# Patient Record
Sex: Female | Born: 1950 | Race: White | Hispanic: No | Marital: Single | Smoking: Former smoker
Health system: Southern US, Community
[De-identification: ages and names within clinical notes are randomized; demographics above are authoritative.]

## PROBLEM LIST (undated history)

## (undated) DIAGNOSIS — I10 Essential (primary) hypertension: Secondary | ICD-10-CM

## (undated) DIAGNOSIS — E785 Hyperlipidemia, unspecified: Secondary | ICD-10-CM

## (undated) DIAGNOSIS — IMO0001 Reserved for inherently not codable concepts without codable children: Secondary | ICD-10-CM

## (undated) DIAGNOSIS — I2510 Atherosclerotic heart disease of native coronary artery without angina pectoris: Secondary | ICD-10-CM

## (undated) DIAGNOSIS — I7300 Raynaud's syndrome without gangrene: Secondary | ICD-10-CM

## (undated) DIAGNOSIS — R230 Cyanosis: Secondary | ICD-10-CM

## (undated) DIAGNOSIS — C519 Malignant neoplasm of vulva, unspecified: Secondary | ICD-10-CM

## (undated) DIAGNOSIS — N289 Disorder of kidney and ureter, unspecified: Secondary | ICD-10-CM

## (undated) DIAGNOSIS — I209 Angina pectoris, unspecified: Secondary | ICD-10-CM

## (undated) DIAGNOSIS — K219 Gastro-esophageal reflux disease without esophagitis: Secondary | ICD-10-CM

## (undated) DIAGNOSIS — R609 Edema, unspecified: Secondary | ICD-10-CM

## (undated) DIAGNOSIS — C569 Malignant neoplasm of unspecified ovary: Secondary | ICD-10-CM

## (undated) DIAGNOSIS — C52 Malignant neoplasm of vagina: Secondary | ICD-10-CM

## (undated) DIAGNOSIS — I509 Heart failure, unspecified: Secondary | ICD-10-CM

## (undated) DIAGNOSIS — G8929 Other chronic pain: Secondary | ICD-10-CM

## (undated) DIAGNOSIS — M549 Dorsalgia, unspecified: Secondary | ICD-10-CM

## (undated) DIAGNOSIS — J449 Chronic obstructive pulmonary disease, unspecified: Secondary | ICD-10-CM

## (undated) DIAGNOSIS — M349 Systemic sclerosis, unspecified: Secondary | ICD-10-CM

## (undated) HISTORY — DX: Malignant neoplasm of vulva, unspecified: C51.9

## (undated) HISTORY — DX: Malignant neoplasm of vagina: C52

## (undated) HISTORY — PX: CERVICAL FUSION: SHX112

## (undated) HISTORY — DX: Essential (primary) hypertension: I10

## (undated) HISTORY — DX: Cyanosis: R23.0

## (undated) HISTORY — DX: Systemic sclerosis, unspecified: M34.9

## (undated) HISTORY — DX: Other chronic pain: G89.29

## (undated) HISTORY — DX: Dorsalgia, unspecified: M54.9

## (undated) HISTORY — DX: Chronic obstructive pulmonary disease, unspecified: J44.9

## (undated) HISTORY — DX: Heart failure, unspecified: I50.9

## (undated) HISTORY — PX: VULVA SURGERY: SHX837

## (undated) HISTORY — DX: Atherosclerotic heart disease of native coronary artery without angina pectoris: I25.10

## (undated) HISTORY — PX: BREAST BIOPSY: SHX20

## (undated) HISTORY — DX: Malignant neoplasm of unspecified ovary: C56.9

## (undated) HISTORY — DX: Raynaud's syndrome without gangrene: I73.00

## (undated) HISTORY — DX: Disorder of kidney and ureter, unspecified: N28.9

## (undated) HISTORY — PX: NASAL SINUS SURGERY: SHX719

## (undated) HISTORY — DX: Gastro-esophageal reflux disease without esophagitis: K21.9

## (undated) HISTORY — PX: ABDOMINAL HYSTERECTOMY: SHX81

---

## 1988-09-09 HISTORY — PX: BACK SURGERY: SHX140

## 2007-04-08 ENCOUNTER — Ambulatory Visit: Payer: Self-pay | Admitting: Nurse Practitioner

## 2007-07-07 ENCOUNTER — Ambulatory Visit: Payer: Self-pay | Admitting: Family Medicine

## 2007-07-24 ENCOUNTER — Ambulatory Visit: Payer: Self-pay | Admitting: Family Medicine

## 2007-11-04 ENCOUNTER — Ambulatory Visit: Payer: Self-pay | Admitting: Dermatology

## 2007-11-16 ENCOUNTER — Ambulatory Visit: Payer: Self-pay | Admitting: Dermatology

## 2008-07-07 ENCOUNTER — Ambulatory Visit: Payer: Self-pay | Admitting: Family Medicine

## 2008-09-07 ENCOUNTER — Ambulatory Visit: Payer: Self-pay | Admitting: Family Medicine

## 2008-09-13 ENCOUNTER — Ambulatory Visit: Payer: Self-pay | Admitting: Urology

## 2008-12-13 ENCOUNTER — Ambulatory Visit: Payer: Self-pay | Admitting: Rheumatology

## 2009-01-12 ENCOUNTER — Ambulatory Visit: Payer: Self-pay | Admitting: Rheumatology

## 2009-11-03 ENCOUNTER — Ambulatory Visit: Payer: Self-pay | Admitting: Family Medicine

## 2010-04-21 ENCOUNTER — Ambulatory Visit: Payer: Self-pay | Admitting: Internal Medicine

## 2010-11-06 ENCOUNTER — Ambulatory Visit: Payer: Self-pay | Admitting: Family Medicine

## 2011-01-21 ENCOUNTER — Ambulatory Visit: Payer: Self-pay | Admitting: Family Medicine

## 2011-01-21 ENCOUNTER — Inpatient Hospital Stay: Payer: Self-pay | Admitting: *Deleted

## 2012-01-07 ENCOUNTER — Ambulatory Visit: Payer: Self-pay | Admitting: Family Medicine

## 2012-01-08 ENCOUNTER — Ambulatory Visit: Payer: Self-pay | Admitting: Family Medicine

## 2012-01-15 HISTORY — PX: BREAST BIOPSY: SHX20

## 2012-01-28 ENCOUNTER — Ambulatory Visit: Payer: Self-pay | Admitting: Surgery

## 2012-01-28 DIAGNOSIS — E119 Type 2 diabetes mellitus without complications: Secondary | ICD-10-CM

## 2012-01-28 LAB — BASIC METABOLIC PANEL
Calcium, Total: 9 mg/dL (ref 8.5–10.1)
Chloride: 102 mmol/L (ref 98–107)
Co2: 29 mmol/L (ref 21–32)
EGFR (African American): >60
EGFR (Non-African Amer.): >60
Glucose: 161 mg/dL — ABNORMAL HIGH (ref 65–99)
Sodium: 139 mmol/L (ref 136–145)

## 2012-01-28 LAB — CBC WITH DIFFERENTIAL/PLATELET
Basophil #: 0 10*3/uL (ref 0.0–0.1)
Basophil %: 0.3 %
Eosinophil #: 0.1 10*3/uL (ref 0.0–0.7)
HCT: 45.5 % (ref 35.0–47.0)
HGB: 15 g/dL (ref 12.0–16.0)
Lymphocyte #: 1.5 10*3/uL (ref 1.0–3.6)
Lymphocyte %: 18.5 %
MCH: 30.9 pg (ref 26.0–34.0)
MCV: 94 fL (ref 80–100)
Monocyte #: 0.5 x10 3/mm (ref 0.2–0.9)
Monocyte %: 6.2 %
Platelet: 218 10*3/uL (ref 150–440)
RDW: 13.8 % (ref 11.5–14.5)
WBC: 8.2 10*3/uL (ref 3.6–11.0)

## 2012-02-04 ENCOUNTER — Ambulatory Visit: Payer: Self-pay | Admitting: Surgery

## 2012-02-06 LAB — PATHOLOGY REPORT

## 2012-03-06 ENCOUNTER — Ambulatory Visit: Payer: Self-pay | Admitting: Urology

## 2012-03-23 ENCOUNTER — Ambulatory Visit: Payer: Self-pay | Admitting: Urology

## 2012-03-31 ENCOUNTER — Ambulatory Visit: Payer: Self-pay | Admitting: Internal Medicine

## 2012-06-16 ENCOUNTER — Ambulatory Visit: Payer: Self-pay | Admitting: Urology

## 2012-06-30 ENCOUNTER — Ambulatory Visit: Payer: Self-pay | Admitting: Urology

## 2012-06-30 LAB — BASIC METABOLIC PANEL
BUN: 14 mg/dL (ref 7–18)
Calcium, Total: 8.8 mg/dL (ref 8.5–10.1)
Co2: 24 mmol/L (ref 21–32)
Osmolality: 280 (ref 275–301)
Potassium: 3.6 mmol/L (ref 3.5–5.1)
Sodium: 138 mmol/L (ref 136–145)

## 2012-06-30 LAB — APTT: Activated PTT: 27.7 secs (ref 23.6–35.9)

## 2012-07-01 LAB — CBC WITH DIFFERENTIAL/PLATELET
Basophil #: 0 10*3/uL (ref 0.0–0.1)
Basophil %: 0.2 %
Eosinophil #: 0.1 10*3/uL (ref 0.0–0.7)
Eosinophil %: 0.6 %
HCT: 36.3 % (ref 35.0–47.0)
HGB: 12.1 g/dL (ref 12.0–16.0)
Lymphocyte #: 1.6 10*3/uL (ref 1.0–3.6)
Lymphocyte %: 16.4 %
MCHC: 33.4 g/dL (ref 32.0–36.0)
MCV: 89 fL (ref 80–100)
Monocyte %: 7 %
Neutrophil #: 7.4 10*3/uL — ABNORMAL HIGH (ref 1.4–6.5)
RDW: 13.5 % (ref 11.5–14.5)
WBC: 9.7 10*3/uL (ref 3.6–11.0)

## 2012-07-01 LAB — BASIC METABOLIC PANEL
Calcium, Total: 8.1 mg/dL — ABNORMAL LOW (ref 8.5–10.1)
Chloride: 107 mmol/L (ref 98–107)
Co2: 22 mmol/L (ref 21–32)
Creatinine: 0.74 mg/dL (ref 0.60–1.30)
EGFR (Non-African Amer.): >60
Sodium: 139 mmol/L (ref 136–145)

## 2013-02-25 ENCOUNTER — Ambulatory Visit: Payer: Self-pay | Admitting: Family Medicine

## 2013-03-31 ENCOUNTER — Ambulatory Visit: Payer: Self-pay | Admitting: Gynecologic Oncology

## 2013-04-06 ENCOUNTER — Ambulatory Visit: Payer: Self-pay | Admitting: Gynecologic Oncology

## 2013-04-09 ENCOUNTER — Ambulatory Visit: Payer: Self-pay | Admitting: Gynecologic Oncology

## 2013-04-19 ENCOUNTER — Ambulatory Visit: Payer: Self-pay | Admitting: Gynecologic Oncology

## 2013-04-19 LAB — CBC
HGB: 14.5 g/dL (ref 12.0–16.0)
MCH: 31.3 pg (ref 26.0–34.0)
MCHC: 35 g/dL (ref 32.0–36.0)
MCV: 90 fL (ref 80–100)
RBC: 4.63 10*6/uL (ref 3.80–5.20)

## 2013-04-19 LAB — BASIC METABOLIC PANEL
Anion Gap: 8 (ref 7–16)
Calcium, Total: 9.4 mg/dL (ref 8.5–10.1)
Chloride: 98 mmol/L (ref 98–107)
Co2: 30 mmol/L (ref 21–32)
Creatinine: 0.82 mg/dL (ref 0.60–1.30)
EGFR (Non-African Amer.): >60
Sodium: 136 mmol/L (ref 136–145)

## 2013-05-05 ENCOUNTER — Observation Stay: Payer: Self-pay | Admitting: Gynecologic Oncology

## 2013-05-05 LAB — CBC WITH DIFFERENTIAL/PLATELET
Eosinophil #: 0.1 10*3/uL (ref 0.0–0.7)
Eosinophil %: 0.8 %
HCT: 33.3 % — ABNORMAL LOW (ref 35.0–47.0)
Lymphocyte #: 1.6 10*3/uL (ref 1.0–3.6)
Lymphocyte %: 20.2 %
MCH: 31.6 pg (ref 26.0–34.0)
MCHC: 34.9 g/dL (ref 32.0–36.0)
MCV: 91 fL (ref 80–100)
Neutrophil #: 5.6 10*3/uL (ref 1.4–6.5)
RBC: 3.67 10*6/uL — ABNORMAL LOW (ref 3.80–5.20)
RDW: 14.9 % — ABNORMAL HIGH (ref 11.5–14.5)

## 2013-05-05 LAB — BASIC METABOLIC PANEL
BUN: 16 mg/dL (ref 7–18)
Calcium, Total: 8.6 mg/dL (ref 8.5–10.1)
Chloride: 101 mmol/L (ref 98–107)
Creatinine: 0.84 mg/dL (ref 0.60–1.30)
EGFR (African American): >60
EGFR (Non-African Amer.): >60
Glucose: 88 mg/dL (ref 65–99)
Osmolality: 274 (ref 275–301)
Sodium: 137 mmol/L (ref 136–145)

## 2013-05-10 ENCOUNTER — Ambulatory Visit: Payer: Self-pay | Admitting: Gynecologic Oncology

## 2013-05-11 ENCOUNTER — Ambulatory Visit: Payer: Self-pay | Admitting: Gynecologic Oncology

## 2013-05-11 LAB — PATHOLOGY REPORT

## 2013-05-31 ENCOUNTER — Ambulatory Visit: Payer: Self-pay | Admitting: Gynecologic Oncology

## 2013-06-09 ENCOUNTER — Ambulatory Visit: Payer: Self-pay | Admitting: Gynecologic Oncology

## 2013-06-15 ENCOUNTER — Ambulatory Visit: Payer: Self-pay | Admitting: Gynecologic Oncology

## 2013-07-05 ENCOUNTER — Ambulatory Visit: Payer: Self-pay | Admitting: Gynecologic Oncology

## 2013-07-05 LAB — BASIC METABOLIC PANEL
Anion Gap: 6 — ABNORMAL LOW (ref 7–16)
Calcium, Total: 9.5 mg/dL (ref 8.5–10.1)
Co2: 29 mmol/L (ref 21–32)
Glucose: 125 mg/dL — ABNORMAL HIGH (ref 65–99)
Potassium: 3.6 mmol/L (ref 3.5–5.1)

## 2013-07-05 LAB — CBC
HCT: 37.2 % (ref 35.0–47.0)
MCV: 84 fL (ref 80–100)
RBC: 4.41 10*6/uL (ref 3.80–5.20)
RDW: 15.3 % — ABNORMAL HIGH (ref 11.5–14.5)
WBC: 11.9 10*3/uL — ABNORMAL HIGH (ref 3.6–11.0)

## 2013-07-10 ENCOUNTER — Ambulatory Visit: Payer: Self-pay | Admitting: Gynecologic Oncology

## 2013-07-13 ENCOUNTER — Inpatient Hospital Stay: Payer: Self-pay | Admitting: Obstetrics and Gynecology

## 2013-08-09 ENCOUNTER — Ambulatory Visit: Payer: Self-pay | Admitting: Gynecologic Oncology

## 2013-08-13 LAB — PATHOLOGY REPORT

## 2013-09-09 ENCOUNTER — Ambulatory Visit: Payer: Self-pay | Admitting: Gynecologic Oncology

## 2013-11-25 ENCOUNTER — Inpatient Hospital Stay: Payer: Self-pay | Admitting: Internal Medicine

## 2013-11-25 LAB — CBC
HCT: 33.4 % — AB (ref 35.0–47.0)
HGB: 11.2 g/dL — ABNORMAL LOW (ref 12.0–16.0)
MCH: 31 pg (ref 26.0–34.0)
MCHC: 33.5 g/dL (ref 32.0–36.0)
MCV: 92 fL (ref 80–100)
Platelet: 216 10*3/uL (ref 150–440)
RBC: 3.62 10*6/uL — AB (ref 3.80–5.20)
RDW: 21.1 % — ABNORMAL HIGH (ref 11.5–14.5)
WBC: 9.7 10*3/uL (ref 3.6–11.0)

## 2013-11-25 LAB — URINALYSIS, COMPLETE
Bilirubin,UR: NEGATIVE
Ketone: NEGATIVE
Leukocyte Esterase: NEGATIVE
Nitrite: NEGATIVE
Ph: 5 (ref 4.5–8.0)
SPECIFIC GRAVITY: 1.029 (ref 1.003–1.030)
SQUAMOUS EPITHELIAL: NONE SEEN
WBC UR: 4 /HPF (ref 0–5)

## 2013-11-25 LAB — DIFFERENTIAL
Basophil #: 0.1 x10 3/mm 3
Basophil %: 1 %
Eosinophil #: 0 x10 3/mm 3
Eosinophil %: 0.1 %
Lymphocyte %: 5.3 %
Lymphs Abs: 0.5 x10 3/mm 3 — ABNORMAL LOW
Monocyte #: 0.4 "x10 3/mm "
Monocyte %: 4.6 %
Neutrophil #: 8.7 x10 3/mm 3 — ABNORMAL HIGH
Neutrophil %: 89 %

## 2013-11-25 LAB — PRO B NATRIURETIC PEPTIDE: B-Type Natriuretic Peptide: 12851 pg/mL — ABNORMAL HIGH (ref 0–125)

## 2013-11-25 LAB — BASIC METABOLIC PANEL
Anion Gap: 8 (ref 7–16)
BUN: 11 mg/dL (ref 7–18)
CREATININE: 1.26 mg/dL (ref 0.60–1.30)
Calcium, Total: 9.1 mg/dL (ref 8.5–10.1)
Chloride: 97 mmol/L — ABNORMAL LOW (ref 98–107)
Co2: 23 mmol/L (ref 21–32)
EGFR (African American): 53 — ABNORMAL LOW
EGFR (Non-African Amer.): 45 — ABNORMAL LOW
GLUCOSE: 407 mg/dL — AB (ref 65–99)
OSMOLALITY: 274 (ref 275–301)
Potassium: 3.2 mmol/L — ABNORMAL LOW (ref 3.5–5.1)
Sodium: 128 mmol/L — ABNORMAL LOW (ref 136–145)

## 2013-11-25 LAB — HEPATIC FUNCTION PANEL A (ARMC)
Albumin: 3.4 g/dL
Alkaline Phosphatase: 140 U/L — ABNORMAL HIGH
Bilirubin, Direct: 0.2 mg/dL
Bilirubin,Total: 0.5 mg/dL
SGOT(AST): 20 U/L
SGPT (ALT): 16 U/L
Total Protein: 7.5 g/dL

## 2013-11-25 LAB — MAGNESIUM: Magnesium: 1.9 mg/dL

## 2013-11-25 LAB — CK-MB
CK-MB: 6 ng/mL — ABNORMAL HIGH
CK-MB: 5.5 ng/mL — ABNORMAL HIGH

## 2013-11-25 LAB — TROPONIN I
Troponin-I: 0.49 ng/mL — ABNORMAL HIGH
Troponin-I: 0.68 ng/mL — ABNORMAL HIGH

## 2013-11-26 LAB — LIPID PANEL
Cholesterol: 273 mg/dL — ABNORMAL HIGH (ref 0–200)
HDL: 27 mg/dL — AB (ref 40–60)
Triglycerides: 1161 mg/dL — ABNORMAL HIGH (ref 0–200)

## 2013-11-26 LAB — CBC WITH DIFFERENTIAL/PLATELET
Basophil #: 0.1 10*3/uL (ref 0.0–0.1)
Basophil %: 1.3 %
EOS ABS: 0.1 10*3/uL (ref 0.0–0.7)
EOS PCT: 0.8 %
HCT: 28.9 % — ABNORMAL LOW (ref 35.0–47.0)
HGB: 10 g/dL — ABNORMAL LOW (ref 12.0–16.0)
Lymphocyte #: 1 10*3/uL (ref 1.0–3.6)
Lymphocyte %: 11.7 %
MCH: 31.6 pg (ref 26.0–34.0)
MCHC: 34.6 g/dL (ref 32.0–36.0)
MCV: 91 fL (ref 80–100)
MONO ABS: 0.6 x10 3/mm (ref 0.2–0.9)
Monocyte %: 7.7 %
Neutrophil #: 6.5 10*3/uL (ref 1.4–6.5)
Neutrophil %: 78.5 %
Platelet: 197 10*3/uL (ref 150–440)
RBC: 3.17 10*6/uL — ABNORMAL LOW (ref 3.80–5.20)
RDW: 21.5 % — ABNORMAL HIGH (ref 11.5–14.5)
WBC: 8.3 10*3/uL (ref 3.6–11.0)

## 2013-11-26 LAB — BASIC METABOLIC PANEL
ANION GAP: 7 (ref 7–16)
BUN: 10 mg/dL (ref 7–18)
CALCIUM: 8.6 mg/dL (ref 8.5–10.1)
Chloride: 99 mmol/L (ref 98–107)
Co2: 27 mmol/L (ref 21–32)
Creatinine: 1.24 mg/dL (ref 0.60–1.30)
EGFR (African American): 54 — ABNORMAL LOW
GFR CALC NON AF AMER: 46 — AB
Glucose: 118 mg/dL — ABNORMAL HIGH (ref 65–99)
OSMOLALITY: 267 (ref 275–301)
Potassium: 3 mmol/L — ABNORMAL LOW (ref 3.5–5.1)
Sodium: 133 mmol/L — ABNORMAL LOW (ref 136–145)

## 2013-11-26 LAB — HEMOGLOBIN A1C: Hemoglobin A1C: 8 % — ABNORMAL HIGH (ref 4.2–6.3)

## 2013-11-26 LAB — MAGNESIUM
MAGNESIUM: 1.7 mg/dL — AB
MAGNESIUM: 2.4 mg/dL

## 2013-11-26 LAB — POTASSIUM: Potassium: 4 mmol/L (ref 3.5–5.1)

## 2013-11-26 LAB — CK-MB: CK-MB: 5.3 ng/mL — AB (ref 0.5–3.6)

## 2013-11-26 LAB — TSH: THYROID STIMULATING HORM: 3.32 u[IU]/mL

## 2013-11-26 LAB — TROPONIN I: Troponin-I: 1 ng/mL — ABNORMAL HIGH

## 2013-11-27 LAB — BASIC METABOLIC PANEL
Anion Gap: 1 — ABNORMAL LOW (ref 7–16)
BUN: 15 mg/dL (ref 7–18)
CALCIUM: 8.5 mg/dL (ref 8.5–10.1)
CHLORIDE: 97 mmol/L — AB (ref 98–107)
Co2: 33 mmol/L — ABNORMAL HIGH (ref 21–32)
Creatinine: 1.29 mg/dL (ref 0.60–1.30)
EGFR (African American): 51 — ABNORMAL LOW
GFR CALC NON AF AMER: 44 — AB
GLUCOSE: 129 mg/dL — AB (ref 65–99)
Osmolality: 265 (ref 275–301)
Potassium: 3.8 mmol/L (ref 3.5–5.1)
SODIUM: 131 mmol/L — AB (ref 136–145)

## 2013-11-29 LAB — BASIC METABOLIC PANEL
ANION GAP: 5 — AB (ref 7–16)
BUN: 22 mg/dL — AB (ref 7–18)
CO2: 28 mmol/L (ref 21–32)
CREATININE: 1.44 mg/dL — AB (ref 0.60–1.30)
Calcium, Total: 9.4 mg/dL (ref 8.5–10.1)
Chloride: 92 mmol/L — ABNORMAL LOW (ref 98–107)
EGFR (African American): 45 — ABNORMAL LOW
EGFR (Non-African Amer.): 39 — ABNORMAL LOW
Glucose: 206 mg/dL — ABNORMAL HIGH (ref 65–99)
Osmolality: 261 (ref 275–301)
Potassium: 4.3 mmol/L (ref 3.5–5.1)
Sodium: 125 mmol/L — ABNORMAL LOW (ref 136–145)

## 2013-11-29 LAB — MAGNESIUM: MAGNESIUM: 1.9 mg/dL

## 2013-11-30 LAB — MAGNESIUM: MAGNESIUM: 1.7 mg/dL — AB

## 2013-11-30 LAB — BASIC METABOLIC PANEL
ANION GAP: 7 (ref 7–16)
BUN: 22 mg/dL — ABNORMAL HIGH (ref 7–18)
CALCIUM: 9.4 mg/dL (ref 8.5–10.1)
CHLORIDE: 92 mmol/L — AB (ref 98–107)
Co2: 29 mmol/L (ref 21–32)
Creatinine: 1.38 mg/dL — ABNORMAL HIGH (ref 0.60–1.30)
EGFR (Non-African Amer.): 41 — ABNORMAL LOW
GFR CALC AF AMER: 47 — AB
Glucose: 177 mg/dL — ABNORMAL HIGH (ref 65–99)
Osmolality: 265 (ref 275–301)
POTASSIUM: 3.8 mmol/L (ref 3.5–5.1)
Sodium: 128 mmol/L — ABNORMAL LOW (ref 136–145)

## 2013-12-01 LAB — BASIC METABOLIC PANEL
ANION GAP: 5 — AB (ref 7–16)
BUN: 28 mg/dL — AB (ref 7–18)
CHLORIDE: 92 mmol/L — AB (ref 98–107)
CREATININE: 1.55 mg/dL — AB (ref 0.60–1.30)
Calcium, Total: 8.9 mg/dL (ref 8.5–10.1)
Co2: 32 mmol/L (ref 21–32)
GFR CALC AF AMER: 41 — AB
GFR CALC NON AF AMER: 35 — AB
GLUCOSE: 171 mg/dL — AB (ref 65–99)
Osmolality: 268 (ref 275–301)
Potassium: 3.6 mmol/L (ref 3.5–5.1)
Sodium: 129 mmol/L — ABNORMAL LOW (ref 136–145)

## 2013-12-23 ENCOUNTER — Ambulatory Visit: Payer: Self-pay | Admitting: Cardiovascular Disease

## 2013-12-23 HISTORY — PX: CORONARY ANGIOPLASTY WITH STENT PLACEMENT: SHX49

## 2013-12-23 HISTORY — PX: CARDIAC CATHETERIZATION: SHX172

## 2013-12-23 LAB — BASIC METABOLIC PANEL
Anion Gap: 7 (ref 7–16)
BUN: 10 mg/dL (ref 7–18)
CHLORIDE: 100 mmol/L (ref 98–107)
CO2: 26 mmol/L (ref 21–32)
CREATININE: 1.34 mg/dL — AB (ref 0.60–1.30)
Calcium, Total: 9.5 mg/dL (ref 8.5–10.1)
EGFR (African American): 49 — ABNORMAL LOW
GFR CALC NON AF AMER: 42 — AB
GLUCOSE: 184 mg/dL — AB (ref 65–99)
OSMOLALITY: 270 (ref 275–301)
POTASSIUM: 3.4 mmol/L — AB (ref 3.5–5.1)
Sodium: 133 mmol/L — ABNORMAL LOW (ref 136–145)

## 2013-12-23 LAB — CK TOTAL AND CKMB (NOT AT ARMC)
CK, Total: 176 U/L
CK-MB: 2.3 ng/mL (ref 0.5–3.6)

## 2013-12-24 LAB — BASIC METABOLIC PANEL
Anion Gap: 8 (ref 7–16)
BUN: 10 mg/dL (ref 7–18)
CO2: 27 mmol/L (ref 21–32)
CREATININE: 1.14 mg/dL (ref 0.60–1.30)
Calcium, Total: 8.5 mg/dL (ref 8.5–10.1)
Chloride: 103 mmol/L (ref 98–107)
EGFR (African American): 59 — ABNORMAL LOW
EGFR (Non-African Amer.): 51 — ABNORMAL LOW
Glucose: 178 mg/dL — ABNORMAL HIGH (ref 65–99)
Osmolality: 279 (ref 275–301)
Potassium: 3.3 mmol/L — ABNORMAL LOW (ref 3.5–5.1)
SODIUM: 138 mmol/L (ref 136–145)

## 2014-01-28 ENCOUNTER — Ambulatory Visit: Payer: Self-pay | Admitting: Gynecologic Oncology

## 2014-02-01 LAB — MISC AER/ANAEROBIC CULT.

## 2014-02-08 ENCOUNTER — Ambulatory Visit: Payer: Self-pay | Admitting: Gynecologic Oncology

## 2014-03-09 ENCOUNTER — Ambulatory Visit: Payer: Self-pay | Admitting: Gynecologic Oncology

## 2014-03-15 ENCOUNTER — Ambulatory Visit: Payer: Self-pay | Admitting: Family Medicine

## 2014-04-12 ENCOUNTER — Ambulatory Visit: Payer: Self-pay | Admitting: Gynecologic Oncology

## 2014-05-10 ENCOUNTER — Ambulatory Visit: Payer: Self-pay | Admitting: Gynecologic Oncology

## 2014-08-08 ENCOUNTER — Ambulatory Visit: Payer: Self-pay | Admitting: Vascular Surgery

## 2014-08-08 LAB — BASIC METABOLIC PANEL
Anion Gap: 6 — ABNORMAL LOW (ref 7–16)
BUN: 13 mg/dL (ref 7–18)
Calcium, Total: 7.9 mg/dL — ABNORMAL LOW (ref 8.5–10.1)
Chloride: 101 mmol/L (ref 98–107)
Co2: 31 mmol/L (ref 21–32)
Creatinine: 1.2 mg/dL (ref 0.60–1.30)
EGFR (African American): 58 — ABNORMAL LOW
EGFR (Non-African Amer.): 48 — ABNORMAL LOW
GLUCOSE: 121 mg/dL — AB (ref 65–99)
OSMOLALITY: 277 (ref 275–301)
Potassium: 3.1 mmol/L — ABNORMAL LOW (ref 3.5–5.1)
SODIUM: 138 mmol/L (ref 136–145)

## 2014-08-22 ENCOUNTER — Ambulatory Visit: Payer: Self-pay | Admitting: Vascular Surgery

## 2014-08-22 LAB — BASIC METABOLIC PANEL
ANION GAP: 7 (ref 7–16)
BUN: 14 mg/dL (ref 7–18)
CO2: 28 mmol/L (ref 21–32)
CREATININE: 1.2 mg/dL (ref 0.60–1.30)
Calcium, Total: 7.1 mg/dL — ABNORMAL LOW (ref 8.5–10.1)
Chloride: 100 mmol/L (ref 98–107)
GFR CALC AF AMER: 58 — AB
GFR CALC NON AF AMER: 48 — AB
Glucose: 132 mg/dL — ABNORMAL HIGH (ref 65–99)
Osmolality: 272 (ref 275–301)
Potassium: 3.1 mmol/L — ABNORMAL LOW (ref 3.5–5.1)
SODIUM: 135 mmol/L — AB (ref 136–145)

## 2014-08-28 ENCOUNTER — Ambulatory Visit: Payer: Self-pay | Admitting: Physician Assistant

## 2014-09-27 ENCOUNTER — Ambulatory Visit: Payer: Self-pay | Admitting: Family Medicine

## 2014-10-18 ENCOUNTER — Encounter: Payer: Self-pay | Admitting: Surgery

## 2014-11-24 ENCOUNTER — Inpatient Hospital Stay: Payer: Self-pay | Admitting: Internal Medicine

## 2014-12-09 ENCOUNTER — Encounter: Payer: Self-pay | Admitting: Surgery

## 2014-12-12 ENCOUNTER — Ambulatory Visit
Admit: 2014-12-12 | Disposition: A | Discharge status: Home / Self Care | Payer: Self-pay | Attending: Family Medicine | Admitting: Family Medicine

## 2014-12-14 ENCOUNTER — Ambulatory Visit
Admit: 2014-12-14 | Disposition: A | Discharge status: Home / Self Care | Payer: Self-pay | Attending: Family | Admitting: Family

## 2014-12-27 NOTE — Op Note (Signed)
PATIENT NAME:  Cheryl Hamilton, Cheryl Hamilton MR#:  [id number] DATE OF BIRTH:  1950-11-11  DATE OF PROCEDURE:  06/30/2012  PREOPERATIVE DIAGNOSIS: Left nephrolithiasis.   POSTOPERATIVE DIAGNOSIS: Left nephrolithiasis.   PROCEDURE: Left percutaneous nephrolithotomy.   SURGEON: Denice Bors. Jacqlyn Larsen, MD    ANESTHESIA: General endotracheal anesthesia.   INDICATIONS: The patient is a 64 year old white female with multiple medical conditions. She recently presented with a large left renal calculus. She has elected to proceed with percutaneous nephrolithotomy for stone removal. She presents today for this purpose. She underwent left percutaneous nephroureteral stent placement in Interventional Radiology earlier in the morning prior to nephrolithotomy.   PROCEDURE: After informed consent was obtained, the patient was taken to the operating room and placed in the prone position on the operating table under general endotracheal anesthesia. The patient was then prepped and draped in the usual standard fashion. The previously placed left nephroureteral stent was utilized. A flexible tip Glidewire was introduced through the nephroureteral stent into the urinary bladder. Dilators were used to approximately 12 [place]. A sheath and stiffener was placed over the guidewire. The stiffener was removed. A Super Stiff guidewire was advanced through the sheath into the urinary bladder. The sheath was then removed. The stiffener was then replaced. Dilation was then undertaken to 36 [place]. The sheath of the 28 French sheath was placed into the upper pole calyx. The dilator was removed. The guidewire was left in place. Nephroscopy was then performed in the standard fashion. The stone could be visualized within the renal pelvis. A second smaller stone and collection of stone fragments were present in the lower pole calyx. Due to an issue with equipment, the ultrasonic lithotripter was not available. The stone was grasped utilizing grasping forceps.  It was able to be fragmented into several pieces utilizing grasping forceps. These were then grasped and removed to as near a completion as possible. A suction catheter was then passed through the scope into the lower pole calyx. The smaller stone fragments were irrigated free. Several smaller fragments within the renal pelvis were also irrigated free. The flexible cystoscope was then utilized to investigate the other remaining calyces. There were no other stones appreciated. A pullback of the sheath into the upper pole calyx demonstrated no additional stones. A 7 French x 24 cm double-J ureteral stent was advanced over the guidewire into the urinary bladder. This was performed under direct vision and fluoroscopic guidance. Adequate curl was noted within the urinary bladder. Adequate curl was also noted within the renal pelvis. The decision was made not to place a nephrostomy tube due to the ease of dilation and overall minimal lack of manipulation. Minimal bleeding was also encountered. The sheath was removed. Sutures were placed in the skin utilizing a 0 Prolene suture in three sections. These were interrupted sutures. This provided good closure of the site. The remaining safety guidewire was then removed under fluoroscopic guidance without difficulty.   The patient was returned to the supine position and awakened from general endotracheal anesthesia. She was taken to the recovery room in stable condition. There were no problems or complications. The patient tolerated the procedure well.   ____________________________ Denice Bors. Jacqlyn Larsen, MD bsc:drc D: 06/30/2012 20:28:41 ET T: 07/01/2012 07:45:54 ET JOB#: [id number]  cc: Denice Bors. Jacqlyn Larsen, MD, <Dictator> Denice Bors Tyronn Golda MD ELECTRONICALLY SIGNED 07/02/2012 15:10

## 2014-12-28 DIAGNOSIS — I7300 Raynaud's syndrome without gangrene: Secondary | ICD-10-CM

## 2014-12-28 DIAGNOSIS — I5022 Chronic systolic (congestive) heart failure: Secondary | ICD-10-CM | POA: Insufficient documentation

## 2014-12-30 NOTE — Consult Note (Signed)
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   Referred by Dr. Sabra Heck.  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 N0 M0)  Pathology Report pathology report reviewed   Imaging Report PET/CT scan reviewed   Referral Report clinical notes reviewed   Planned Treatment Regimen intravaginal brachytherapy   HPI   patient is a 64 year old female with history of scleroderma who presented with a several month history of vulvar. Patient itching and pain on urination. Initially empirically treated for UTI although symptoms persisted. Dr. 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 Lesion was approximately 23 mm with deep margin positive. This was staged a T1 B. She went on to have a wide local excisionwith bilateral inguinal lymphadenectomy as well as multiple biopsies the vaginal vault. There was multiple sites of invasive squamous carcinoma in the upper vaginal and posterior vaginal walls. Greatest depth of invasion was 0.4 cm. Lesions measured at the greatest interval 0.6 cm x 0.2 cm deep and peripheral margins were positive. 0/21 lymph nodes from both external and internal inguinal Mohs were negative. She just had her drains removed and is doing well. Patient does have a history of scleroderma.she is now seen for radiation oncology opinion. She is doing fairly well no significant diarrhea or dysuria at this time.  Past Hx:    Raynaud's Disease:    GERD -  Esophageal Reflux:    Diabetes Mellitus, Type II (NIDD):    Kidney Stones:    Cancer, Ovarian:    Emphysema:    COPD:    Pulmonary Hypertension:    Sinus Surgery:    Cervical Fusion:    Schleroderma:    Hysterectomy - Total:    Back Surgery:   Past, Family and Social History:  Past Medical History positive   Respiratory COPD; pulmonary hypertension, emphysema   Gastrointestinal GERD   Genitourinary kidney stones; ovarian cancertreated with surgical resection alone   Endocrine diabetes mellitus   Past Surgical History back surgery, hysterectomy total, cervical fusion, sinus surgery   Past Medical History Comments Raynaulds disease   Family History noncontributory   Social History noncontributory   Additional Past Medical and Surgical History seen by herself today   Allergies:   No Known Allergies:   Home Meds:  Home Medications: Medication Instructions Status  acetaminophen-oxyCODONE 325 mg-5 mg oral tablet 1 tab(s) orally every 6 hours, As Needed - for Pain Active  IBU 800 mg oral tablet 1 tab(s) orally 3 times a day, As Needed - for Pain Active  Dulcolax Stool Softener 2 cap(s) orally once a day (at bedtime) Active  hydrochlorothiazide 25 mg oral tablet 1 tab(s) orally once a day (in the morning) Active  Klor-Con M20 oral tablet, extended release 1 tab(s) orally once a day (in the morning) Active  metformin 500 mg oral tablet 0.5 tab(s) orally once a day (in the morning) Active  Nasonex 50 mcg/inh nasal spray 2 spray(s) nasal once a day (in the morning) Active  pantoprazole 40  mg oral delayed release tablet 1 tab(s) orally once a day (before a meal) [Dinner] Active  Zyrtec 10 mg oral tablet 1 tab(s) orally once a day (at bedtime) Active  minocycline 100 mg oral capsule 1 cap(s) orally 2 times a day  Active  Plaquenil Sulfate 200 mg oral tablet 1 tab(s) orally 2 times a day  Active  amlodipine 2.5 mg oral tablet 1 tab(s) orally once a day ( in the  evening) Active  simvastatin 20 mg oral tablet 1 tab(s) orally once a day (at bedtime) Active  Vitamin D2 50,000 intl units (1.25 mg) oral capsule 1 cap(s) orally once a week  (on Sunday) Active  fenofibrate 134 mg oral capsule 1 cap(s) orally once a day (in the evening) Active   Review of Systems:  General negative   Performance Status (ECOG) 0   Skin negative   Breast negative   Ophthalmologic negative   ENMT negative   Respiratory and Thorax negative   Cardiovascular negative   Gastrointestinal negative   Genitourinary see HPI   Musculoskeletal negative   Neurological negative   Psychiatric negative   Hematology/Lymphatics negative   Endocrine negative   Allergic/Immunologic see HPI   Review of Systems   review of systems systems obtained from nurse's notes.  Physical Exam:  General/Skin/HEENT:  General normal   Eyes normal   ENMT normal   Head and Neck normal   Additional PE well-developed female in NAD. She has a grayish hue secondary to mylocycline. Lungs are clear to A&P cardiac examination shows regular rate and rhythm. Abdominal incisions are healing well. Inguinal drains have just been removed. On speculum examination vaginal vault shows multiple areas of recent biopsy with granulation tissue. There is some fibrosis to the vaginal wall.   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 Sabra Heck. This time I am more concerned about the residual disease in the vaginal vault. Based on the fact she has scleroderma do not think external beam radiation would be advisable. I believe I can get by with a course of 20 gray in 5 fractions prescribed 0.5 cm from the cylinder surface of intravaginal brachytherapy. Would treat the entire length of the vaginal vault. Risks and benefits of treatment including more prominent fibrosis affected tissue possible slight diarrhea and dysuria, or discussed in detail with the patient. I also would recommend a vaginal dilator after completion of radiation and I have discussed that with the patient. I would like to allow healing at this time I have set her up for simulation around Christmastime if she is well-healed at that time.  I would like to take this opportunity to thank you for allowing me to continue to participate in this patient's care.  CC Referral:  cc: Dr. Enzo Bi, Dr. Thereasa Distance   Electronic Signatures: Baruch Gouty Roda Shutters (MD)  (Signed [phone] 13:29)  Authored: HPI, Diagnosis, Past Hx, PFSH, Allergies, Home Meds, ROS, Physical Exam, Other Results, Relevent Results,  Encounter Assessment and Plan, CC Referring Physician   Last Updated: 25-Nov-14 13:29 by Armstead Peaks (MD)

## 2014-12-30 NOTE — Op Note (Signed)
PATIENT NAME:  Cheryl Hamilton, Cheryl Hamilton MR#:  [id number] DATE OF BIRTH:  [birth year]  DATE OF PROCEDURE:  05/04/2013  SURGEON:  Jacquelyne Balint, MD.  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   POSTOPERATIVE DIAGNOSIS: Extensive vulva intraepithelial neoplasia. Possible early invasion in a localized area. Vaginal intraepithelial neoplasia.  PROCEDURE PERFORMED: Biopsies of the vagina and vulva. Wide local excision of the vulva.   COMPLICATIONS: None.   ESTIMATED BLOOD LOSS: 100 mL   INDICATION FOR SURGERY: Ms. 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 Extensive involvement of the vagina and the entire vulvar area was noted with preinvasive changes. Therefore, the decision was made to resect the thickened areas to ensure that invasion it is not overlooked prior to medical therapy and laser therapy in case for the noninvasive portions or lymphadenectomy if deeper invasion is noted.   FINDINGS AT TIME OF SURGERY: Thickened area on the left lower labia minora, about 4 x 2 cm. Two small raised areas on the right side about 1 cm in diameter. Extensive condylomatous VIN covering the remainder of the external genitalia and lower vagina.   OPERATIVE REPORT: After adequate general anesthesia had been obtained, the patient was prepped and draped in ski position. The bladder was drained.   Then the area on the left labia minora worrisome for invasion, was excised with a 1 cm margin. The incision was carried down deeply using the Harmonic scalpel. Thus, the area could be completely removed. Two figure-of-eight stitches were used to achieve adequate hemostasis.   Then examination under anesthesia was done and multiple biopsies were taken from the vagina and the surrounding vulvar tissue, as well as resection of 2 x 3 cm area from the lower portion of the right  lower labia minora.   Hemostasis in all areas was achieved with cautery. The vulva was reapproximated with deep sutures using 2-0 Vicryl and the skin was closed with 2-0 Vicryl using interrupted mattress sutures. In view of the extent of the dissection a Foley catheter was inserted. Silvadene cream was placed over the wound areas.   The patient tolerated the procedure well and was taken to the recovery room in satisfactory condition. Postoperative urine was clear. Pad, sponge, needle, and instrument counts were correct x 2.     ____________________________ Weber Cooks, MD bem:dp D: 05/04/2013 15:22:33 ET T: 05/04/2013 15:49:51 ET JOB#: [id number]  cc: Weber Cooks, MD, <Dictator> Weber Cooks MD ELECTRONICALLY SIGNED 05/04/2013 16:27

## 2014-12-30 NOTE — Op Note (Signed)
PATIENT NAME:  Cheryl Hamilton, Cheryl Hamilton MR#:  [id number] DATE OF BIRTH:  [birth year]  DATE OF PROCEDURE:  07/13/2013  PREOPERATIVE DIAGNOSIS: Invasive squamous cell carcinoma of the vulva.   POSTOPERATIVE DIAGNOSIS: Invasive squamous cell carcinoma off vulva and upper vagina.   PROCEDURES PERFORMED: Examination under anesthesia. Multiple biopsies. Bilateral inguinal lymphadenectomy.   SURGEON: Jacquelyne Balint, M.D.   ANESTHESIA: General.   ESTIMATED BLOOD LOSS: 50 mL.   COMPLICATIONS: None.   INDICATION FOR SURGERY: Ms. Harries presented with extensive VAIN and VIN.  During a previous procedure multiple biopsies were taken from vagina and vulva, including a wide local excision of the left lower labia minora. All biopsies revealed evidence of a pre invasive disease only, except the incisional area where an invasive squamous cell carcinoma to a depth of 2.2 mm was noted. Further evaluation with PET scan failed to reveal any evidence of metastatic disease. Therefore, the decision was made to proceed with bilateral inguinal lymphadenectomy and laser vaporization of the vaginal and the remaining vulvar lesion, as well as re-resection of the area containing the invasive portion.   FINDINGS AT TIME OF SURGERY: Multiple areas of a condylomatous and dysplastic lesions in vagina and vulva. Again, multiple biopsies were taken at this time. Invasive disease was also noted in a biopsy of the upper vagina and the possible invasive disease on the upper left labia and the right labia on frozen section analysis.   Nodal evaluation revealed only slightly enlarged nodes, one of which failed to reveal any evidence of malignancy on biopsy. In view of the biopsies positive for invasion in the vagina and other areas of the vulva, the decision was made not to proceed with a repeat radical excision of the vulva as this is a field problem which needs to be treated with radiation therapy. However, the decision was made to still  continue with the lymphadenectomy.   The inguinal lymphadenectomy was done.   OPERATIVE REPORT: After adequate general anesthesia had been obtained, the patient was prepped and draped in ski position. Examination under anesthesia was done with the above-mentioned findings. Multiple biopsies were taken with the above-mentioned results. A Foley catheter was placed.   Attention was then directed towards the lymphadenectomy which was done in a similar fashion on either groin. An incision was placed below the inguinal ligament, and carried down through Scarpa's fascia. Then, the femoral triangle was identified. Using the Harmonic scalpel, the node bearing tissue between adductor muscle and sartorius muscle to about 2 cm above the inguinal ligament was mobilized. The saphenous vein was dissected out all the way to its entrance into the femoral vein. All node bearing fatty tissue above the cribriform fascia was removed. For dissection of the deep inguinal node, the femoral vein was completely freed and the node bearing fatty tissue on top of the artery and around the femoral vein was removed. Hemostasis was achieved with cautery. Irrigation of the femoral triangle was done. Adequate hemostasis was confirmed. A JP drain was placed. The subcutaneous tissue was reapproximated with interrupted 2-0 Vicryl stitches and the skin was then closed with 2-0 Vicryl mattress sutures. The drain was fixated with a nylon stitch. Dermabond was applied.   Attention was then again directed towards the vagina were more biopsies were taken, but the decision was made not to proceed with laser vaporization or re-resection.   The patient tolerated the procedure well and was taken to the recovery room in stable condition. Postoperative urine was clear. Pad, sponge, needle, and  instrument counts were correct x 2.   ____________________________ Weber Cooks, MD bem:sg D: 07/15/2013 08:41:32 ET T: 07/15/2013 09:20:23  ET JOB#: [id number]  cc: Weber Cooks, MD, <Dictator> Weber Cooks MD ELECTRONICALLY SIGNED 08/03/2013 13:05

## 2014-12-31 NOTE — Discharge Summary (Signed)
PATIENT NAME:  Cheryl Hamilton, Cheryl Hamilton MR#:  [id number] DATE OF BIRTH:  [birth year]  DATE OF ADMISSION:  11/25/2013 DATE OF DISCHARGE:  12/01/2013  ADMITTING PHYSICIAN:  Demetrios Loll, MD.   DISCHARGING PHYSICIAN: Gladstone Lighter, MD.    PRIMARY CARE PHYSICIAN: Sofie Hartigan, MD.   [place]: Cardiology consultation by Dionisio David, MD.    DISCHARGE DIAGNOSES: 1. Acute respiratory failure.  2. Acute systolic congestive heart failure exacerbation with ejection fraction of 30%.  3. Scleroderma.  4. Reynaud's syndrome.  5. Hypertension.  6. Acute renal failure.  7. Chronic back pain and status post back surgery.  8. Gastroesophageal reflux disease.  9. Status post ovarian cancer and vulvar cancer, finished treatment.  10. 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    DISCHARGE HOME MEDICATIONS:  1. Plaquenil 200 mg p.o. b.i.d.  2. Protonix 40 mg p.o. daily.  3. Nasonex inhaler 2 sprays each nostril once a day.  4. Simvastatin 20 mg p.o. at bedtime.  5. Fenofibrate 134 mg p.o. at bedtime.  6. Norvasc 2.5 mg p.o. daily.  7. Potassium chloride 10 mEq p.o. daily.  8. Percocet 5/325 mg 1 tablet q.6 hours p.r.n.  9. Lisinopril 5 mg p.o. daily.  10. Digoxin 125 mcg p.o. daily.  11. Carvedilol 6.25 mg p.o. b.i.d.  12. Lasix 20 mg p.o. daily.  13. Aspirin 325 mg p.o. daily.  14. Benzonatate 100 mg capsules every 6 hours p.r.n. for cough.  15. Flexeril 5 mg q.8 hours p.r.n. for muscle spasms and pain.   DISCHARGE DIET: Low-sodium diet.   DISCHARGE HOME OXYGEN: None.   DISCHARGE ACTIVITY: As tolerated.   FOLLOWUP INSTRUCTIONS:  1. PCP follow-up in 1 week.  2. Cardiology follow-up in 1 to 2 weeks.  3. Home health.  4. Follow up with oncology as per schedule.  5. Follow-up a basic metabolic panel for serum creatinine in 1 week with PCP.    LABORATORY DATA AND IMAGING STUDIES PRIOR TO DISCHARGE:    1. Sodium 129, potassium 3.6, chloride 92, bicarbonate 32, BUN 28,  creatinine 1.5, glucose of 171, and calcium of 8.9.  WBC 8.3, hemoglobin 10.0, hematocrit 28.9, platelet count 197,000.  2. Echo Doppler showing LV ejection fraction is 30% to 35%, elevated left atrial, left ventricular and diastolic pressures, dilated left atrium with a normal pattern of LV diastolic filling, moderate mitral regurgitation.   3. Chest x-ray on 11/27/2013 showing cardiomegaly with mild interstitial edema, small bilateral pleural effusions.  4. CT of the chest without contrast showing mild areas of increased density within the lung bases, could be scarring or atelectasis, and mild interstitial infiltrate in the right base.  Evaluation in 6 months is recommended.  Trace pleural effusion in left lung base, scarring versus atelectasis in right lower lobe, coronary artery and atherosclerotic calcifications. Enlarged mediastinal lymph nodes were also noted.   5. Cholesterol is 273, triglycerides 1161, HDL 27, and LDL is not calculated because of elevated triglycerides.  6. TSH is 3.32.   BRIEF HOSPITAL COURSE:  Cheryl Hamilton is a 64 year old Caucasian female with past medical history significant for systemic scleroderma and Raynaud's disease who has been on immunosuppressive treatment, minocycline, and Plaquenil and had trouble with vulvar cancer, ovarian cancer which were taken care of at a tertiary care center, comes in secondary to acute respiratory failure, hypoxia.   1. Acute respiratory failure secondary to CHF which is acute systolic CHF.  She also has underlying scar tissue in both lungs, likely from  her connective tissue disorder. The patient never been on home oxygen. She is requiring 4 liters of oxygen here, initially very dyspneic even on minimal exertion with coughing, even with speaking 1 word. However, after diureses, her breathing has improved and she had a CT scan, which showed improvement in pulmonary edema, but did show some scar tissue and atelectasis.  She has been finally weaned  off the home weaned off the oxygen. She is saturating 100% on room air at rest and also 95% on room air on exertion NPH as she does not qualify for home oxygen at this time. Her minocycline was held as this might cause CHF.  The patient also had chemotherapy for her ovarian cancer and also had radiation for vulvar cancer so not sure what has caused the cardiomyopathy.  She was seen by cardiologist in the hospital as mentioned above, her ejection fraction is down to 30% so she is being discharged on Coreg, Lasix, lisinopril, statin, and also on digoxin. The patient is also on aspirin.  2. Systemic scleroderma. The patient can follow up with her rheumatologist at [facility].  Minocycline has been stopped at this time.  3. Acute renal failure, likely has underlying CKD from what the patient was thinking, but not sure of her previous renal function status. Initially, she was overdiuresed and then Lasix was decreased. She was given some fluids for hypotension and she needs to have a follow-up basic metabolic panel for renal function in 1 week.  4. Her course has been otherwise uneventful in the hospital.   DISCHARGE CONDITION: Stable.   DISCHARGE DISPOSITION: Home with home health.   TIME SPENT ON DISCHARGE: 45 minutes.   ____________________________ Gladstone Lighter, MD rk:by D: 12/01/2013 15:57:13 ET T: 12/01/2013 20:40:50 ET JOB#: [id number]  cc: Gladstone Lighter, MD, <Dictator> Gladstone Lighter MD ELECTRONICALLY SIGNED 12/02/2013 16:08

## 2014-12-31 NOTE — Consult Note (Signed)
PATIENT NAME:  Cheryl Hamilton, Cheryl Hamilton MR#:  [id number] DATE OF BIRTH:  Oct 01, 1950  DATE OF CONSULTATION:  11/26/2013  CONSULTING PHYSICIAN:  Dionisio David, MD  HISTORY OF PRESENT ILLNESS: This is a 64 year old white female with a past medical history of scleroderma, hypertension, history of vaginal cancer, status post chemotherapy, who presented to the Emergency Room with shortness of breath.  The shortness of breath was so severe that she could not talk. She also had an episode of chest pain, but while she was coughing. She said she just had radiation and chemotherapy for vaginal cancer after having surgery.   PAST MEDICAL HISTORY: History of hysterectomy, history of lumbar spine surgery, cervical spine fusion.   ALLERGIES: None.   MEDICATIONS: Norvasc 2.5 mg, hydrochlorothiazide 1 mg, Klor-Con 20 mEq once a day, metformin, Zocor 20 mg.   PHYSICAL EXAMINATION: GENERAL: She is alert, oriented x 3, in mild distress due to shortness of breath.  VITAL SIGNS: Blood pressure was 119/78, respirations 18, pulse was 108, temperature 98.3.  HEENT:  Revealed Positive jugular venous distention.  LUNGS: There is crepitation at the bases.  HEART:  Tachycardic. Normal S1, S2. No audible murmur.  ABDOMEN: Soft, nontender, positive bowel sounds.  EXTREMITIES: 1+ pedal edema.   LABS AND IMAGING STUDIES: EKG shows sinus tachycardia at 125 beats per minute, biatrial enlargement, nonspecific ST-T changes. Her troponin was 0.68 and the follow-up troponin actually went up to 1.0, BUN 11 creatinine 1.26.   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 Advise getting an echocardiogram and probably treat the patient as systolic heart failure. Elevated troponin most likely due to congestive heart failure. She has atypical chest pain most likely due to cough and pleuritic nature of the chest pain. We will do an echocardiogram and  modify medications. Increase Lasix to get her better.   ____________________________ Dionisio David, MD sak:dp D: 11/26/2013 08:11:26 ET T: 11/26/2013 08:59:40 ET JOB#: [id number]  cc: Dionisio David, MD, <Dictator> Dionisio David MD ELECTRONICALLY SIGNED 12/22/2013 13:58

## 2014-12-31 NOTE — Op Note (Signed)
PATIENT NAME:  Cheryl Hamilton, Cheryl Hamilton MR#:  [id number] DATE OF BIRTH:  May 26, 1951  DATE OF PROCEDURE:  08/08/2014  PREOPERATIVE DIAGNOSIS: Peripheral arterial disease with rest pain right lower extremity.    POSTOPERATIVE DIAGNOSIS: Peripheral arterial disease with rest pain right lower extremity.    PROCEDURES:   1.  Ultrasound guidance for vascular access to left femoral artery.  2.  Catheter placement into right popliteal artery from left femoral approach.  3.  Aortogram and selective right lower extremity angiogram.  4.  Percutaneous transluminal angioplasty with drug-coated balloon to right external iliac artery with 6 mm diameter drug-coated angioplasty balloon.  5.  Percutaneous transluminal angioplasty with drug-coated angioplasty balloon to the right distal superficial femoral artery and above-knee popliteal artery with 5 mm diameter Lutonix drug-coated angioplasty balloon.  6.  StarClose closure device to left femoral artery.   SURGEON: Algernon Huxley, MD.   ANESTHESIA: Local with moderate conscious sedation.   ESTIMATED BLOOD LOSS: 25 mL.     INDICATION FOR PROCEDURE: This is a female who was referred to my office for ischemic pain in the right lower extremity. She had a CT scan performed by her cardiologist which showed multilevel disease. She was brought in for angiogram for further evaluation and potential treatment. Risks and benefits were discussed. Informed consent was obtained.   DESCRIPTION OF PROCEDURE: The patient was brought to the vascular suite. Groins were shaved and prepped and a sterile surgical field was created. The left femoral artery was visualized with ultrasound and found to be patent. It was then accessed under direct ultrasound guidance without difficulty with a Seldinger needle. A J-wire and 5 French sheath were then placed. Pigtail catheter was placed in the aorta at the L1-L2 level and then pulled down to the bifurcation where pelvic obliques were performed.  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 Proximal to the aneurysm at the origin was about a 50-60% stenosis, the vessel then normalized and the external iliac artery just beyond the hypogastric artery had about a 70% stenosis.  Her common femoral artery, proximal superficial femoral artery, and profunda femoris artery were patent. She then had an occlusion of the distal SFA into the above-knee popliteal artery with 2 vessel runoff distally. I heparinized the patient. I crossed the aortic bifurcation and advanced to the right femoral head with a [employer] Advantage wire and an Ansell sheath.  We had already crossed the iliac stenosis. The above-knee popliteal and distal SFA occlusion was crossed without difficulty with an Advantage wire and a Kumpe catheter and confirmed intraluminal flow in the below-knee popliteal artery. I then replaced the balloon. A 5 mm diameter angioplasty balloon using Lutonix drug-coated angioplasty balloon was inflated in the popliteal artery from just above the knee to  the mid to distal SFA. Narrowing was seen which resolved with angioplasty and completion angiogram showed mild dissection with no flow-limiting stenosis or flow limitation from the mild dissection. The iliac artery was treated with a 6 mm diameter drug-coated angioplasty balloon. Again completion angiogram showed about a 20% residual stenosis which was not flow limiting. At this point we had improved both of these areas. The common iliac artery stenosis more proximally would not be easily treatable due to the iliac artery aneurysm and the disease in the distal aorta and if her symptoms persist we  would consider treating the aneurysm in the aorta with a covered stent graft, so at this point I elected to terminate the procedure. The sheath was pulled back to  the ipsilateral external iliac artery and oblique arteriogram was performed. StarClose closure device was deployed in the usual fashion with excellent hemostatic result. The patient tolerated the procedure well and was taken to the recovery room in stable condition.    ____________________________ Algernon Huxley, MD jsd:bu D: 08/10/2014 16:08:13 ET T: 08/10/2014 17:26:40 ET JOB#: [id number]  cc: Algernon Huxley, MD, <Dictator> Algernon Huxley MD ELECTRONICALLY SIGNED 08/21/2014 11:43

## 2014-12-31 NOTE — Discharge Summary (Signed)
PATIENT NAME:  Cheryl Hamilton, Cheryl Hamilton MR#:  [id number] DATE OF BIRTH:  [birth year]  DATE OF ADMISSION:  12/23/2013 DATE OF DISCHARGE:  12/24/2013   REASON FOR ADMISSION: Status post angioplasty.   HISTORY AND HOSPITAL COURSE: This is a 64 year old white female with a past medical history of vaginal cancer, history of scleroderma, who presented to the hospital for cardiac catheterization. Cardiac catheterization revealed high-grade lesion in the mid left circumflex, mid LAD, and right coronary was codominant and had diffuse 60% disease also. Ejection fraction was not evaluated because ejection fraction in the office was about 40% by echocardiogram. She had a PCI and bare-metal stent placed in mid LAD, and this morning is feeling much better. Denies any chest pain, shortness of breath. She has pain in her knees, which orthopedics will evaluate before discharge and will be sent home with the follow discharge diagnoses.   DISCHARGE DIAGNOSES:  1. Coronary artery disease, status post percutaneous coronary intervention and stenting of the left anterior descending, left circumflex and right coronary artery. Will be treated medically.  Other diagnoses she carries are: 2. Chronic renal insufficiency.  3. Scleroderma. 4. Vaginal cancer, status post chemotherapy. 5. Left ventricular dysfunction.  DISCHARGE MEDICATIONS: Will be carvedilol which will be increased from 6.25 to 25 b.i.d. because her blood pressure is high and heart rate is 90. She will continue amlodipine 2.5 mg once a day. Digoxin will be discontinued because of renal insufficiency. Hydralazine is being added 25 b.i.d. because her blood pressure. Plaquenil 200 b.i.d. will be continued. She will be continued on simvastatin or Zocor 20 mg once a day, aspirin 81 mg once a day. She will have a new medication of Brilinta 90 mg twice a day.   FOLLOWUP: She will be followed up in the office in 1 to 2 weeks.   ____________________________ Dionisio David,  MD sak:lb D: 12/24/2013 08:46:11 ET T: 12/24/2013 09:11:10 ET JOB#: [id number]  cc: Dionisio David, MD, <Dictator> Dionisio David MD ELECTRONICALLY SIGNED 01/14/2014 11:05

## 2014-12-31 NOTE — H&P (Signed)
PATIENT NAME:  Cheryl Hamilton, Cheryl Hamilton MR#:  [id number] DATE OF BIRTH:  10/10/1950  DATE OF ADMISSION:  11/25/2013  PRIMARY CARE PHYSICIAN: Dr. Ellison Hughs. REFERRING PHYSICIAN: Dr. Cinda Quest.  CHIEF COMPLAINT: Shortness of breath since this morning.   HISTORY OF PRESENT ILLNESS: A 64 year old Caucasian female with a history of scleroderma, hypertension, prediabetes, who presented to the ED with shortness of breath since this morning. The patient is alert, awake, oriented, in no acute distress. The patient was fine until this morning. She developed shortness of breath suddenly and with a lot of cough and sputum. Symptoms have been worsening. She has to struggle catch her breath so she called the ambulance was sent to the ED.   The patient mentioned that she also has orthopnea and leg swelling recently. The patient has chest pain, a little cough but denies any palpitation, diaphoresis. No nausea, vomiting. No fever or chills. The patient has scleroderma. For 4 years, has been on Plaquenil and minocycline. The patient has blue skin, possibly due to Plaquenil.  PAST MEDICAL HISTORY: Recent vaginal cancer status post radiation 1 week ago. 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   PAST SURGICAL HISTORY: Hysterectomy, diskectomy, lumbar spine surgery, cervical spine fusion, breast biopsy.   ALLERGIES: None.   HOME MEDICATIONS:  1.  Norvasc 2.5 mg p.o. daily.  2.  Fenofibrate 134 mg p.o. at bedtime.  3.  Hydrochlorothiazide 1 tablet once a day.  4.  Klor-Con 20 mEq p.o. daily.  5.  Metformin 500 mg at 0.5 tablet once a day.  6.  Minocycline 100 mg p.o. b.i.d. 7.  Nasonex 50 mcg inhalation spray 2 sprays once a day.  8.  Pantoprazole 40 mg p.o. daily.  9.  Plaquenil sulfate 200 mg p.o. b.i.d.  10.  Zocor 20 mg p.o. at bedtime.    REVIEW OF SYSTEMS:  CONSTITUTIONAL: Denies any fever or chills. No headache or dizziness but has generalized weakness.  EYES: No double vision or blurred vision.  ENT:  No postnasal drip, slurred speech or dysphagia.  CARDIOVASCULAR: No chest pain, palpitation, orthopnea, or nocturnal dyspnea. No leg edema. Also has chest pain, a little cough.  PULMONARY: Positive for cough, sputum, shortness of breath, but no hemoptysis. No wheezing.  GASTROINTESTINAL: No nausea, vomiting or diarrhea. No melena or bloody stool.  GENITOURINARY: No dysuria, hematuria, or incontinence.  NEUROLOGIC: No syncope, loss of consciousness or seizure.  SKIN: No rash or jaundice but has blue skin due to side effect of medication.  HEMATOLOGY: No easy bruising or bleeding.  ENDOCRINE: No polyuria, polydipsia, heat or cold intolerance.   PHYSICAL EXAMINATION:  VITAL SIGNS: Temperature 98, blood pressure 153/86, pulse 124, oxygen saturation was 86 on  room air and now 95 on oxygen.  GENERAL: Alert, awake, oriented, in no acute distress.  HEENT: Pupils round, equal, and reactive to light and accommodation. Dry oral mucosa. Clear oropharynx.  NECK: Supple. No JVD or carotid bruit. No lymphadenopathy. No thyromegaly.  CARDIOVASCULAR: S1, S2. Regular rate, tachycardia. No murmurs or gallops.  PULMONARY: Bilateral air entry. Bilateral basilar crackles. Mild use of accessory muscle to breathe.  ABDOMEN: Soft. No distention or tenderness. No organomegaly. Bowel sounds present.  EXTREMITIES: Bilateral leg edema, 2+. No clubbing or cyanosis. No calf tenderness. Difficult to see whether the patient has pedal pulses.  SKIN: No rash or jaundice, but has blue skin all over the body.  NEUROLOGY: A and O x3. No focal deficit. Power 5/5. Sensation intact.   LABORATORY AND DIAGNOSTICS: Chest x-ray shows COPD changes with mild superimposed CHF and a small basilar effusion.   CBC: WBC 9.7, hemoglobin 11.2, platelets 216. Troponin  0.49, glucose 407, BUN 11, creatinine 1.26, sodium 128, potassium 3.2, chloride 97, bicarbonate 23. Urinalysis negative.   EKG showed sinus tachycardia at 125 BPM.   IMPRESSIONS:  1.  Acute congestive heart failure, new onset.  2.  Acute respiratory failure.  3.  Elevated troponin, possibly due to acute congestive heart failure.  4.  Tachycardia.  5.  Hypokalemia.  6.  Hyponatremia. 7.  Anemia.  8.  Diabetes.  9.  Scleroderma.   PLAN OF TREATMENT:  1.  The patient will be admitted to telemetry floor. We will start CHF protocol. Start Lasix 40 mg IV q.12 hours. Will get an echocardiograph and a cardiology consult.  2.  We will follow up troponin level, give aspirin and statin.  3.  Also, we will hold Plaquenil, minocycline, and metformin, which are risk factors for CHF. There is a rare side effect of cardiomyopathy caused by Plaquenil.  4.  For diabetes, we will start sliding scale. Check hemoglobin A1c.  5.  For hyponatremia, will follow up BMP.   I discussed the patient's condition and plan of treatment with the patient.   TIME SPENT: About 68 minutes.  ____________________________ Demetrios Loll, MD qc:np D: 11/25/2013 18:56:24 ET T: 11/25/2013 19:49:52 ET JOB#: [id number]  cc: Demetrios Loll, MD, <Dictator> Demetrios Loll MD ELECTRONICALLY SIGNED 11/26/2013 17:02

## 2014-12-31 NOTE — Op Note (Signed)
PATIENT NAME:  Cheryl Hamilton, Cheryl Hamilton MR#:  [id number] DATE OF BIRTH:  [birth year]  DATE OF PROCEDURE:  08/22/2014  PREOPERATIVE DIAGNOSES:  1.  Peripheral arterial disease with claudication, left lower extremity.  2.  Status post right lower extremity revascularization for rest pain.  3.  Chronic obstructive pulmonary disease.  4.  Vaginal cancer, status post radiation to the pelvis.  5.  Diabetes.   POSTOPERATIVE DIAGNOSES: 1.  Peripheral arterial disease with claudication, left lower extremity.  2.  Status post right lower extremity revascularization for rest pain.  3.  Chronic obstructive pulmonary disease.  4.  Vaginal cancer, status post radiation to the pelvis.  5.  Diabetes.   PROCEDURE PERFORMED:  1.  Ultrasound guidance for vascular access to right femoral artery.  2.  Catheter placement into left popliteal artery from right femoral approach.  3.  Aortogram and selective left lower extremity angiogram.  4.  Percutaneous transluminal angioplasty of popliteal artery with 4 mm diameter angioplasty balloon.  5.  Percutaneous transluminal angioplasty of the superficial femoral artery with 4 mm diameter drug-coated balloon.  6.  Percutaneous transluminal angioplasty of proximal superficial femoral artery with 5 mm diameter drug-coated angioplasty balloon.  7.  Percutaneous transluminal angioplasty of mid and distal superficial femoral artery with 4 and 5 mm diameter angioplasty balloon.  8.  Popliteal stent placement for residual stenosis and dissection after angioplasty with a 6 mm diameter x 8 cm length stent.  9.  Superficial femoral artery stent placement for residual stenosis and dissection after angioplasty, separate and distinct, to the popliteal area with a 6 mm diameter x 20 mm self-expanding stent.  10.  StarClose closure device, right femoral artery.   SURGEON: Algernon Huxley, MD   ANESTHESIA: Local, with moderate conscious sedation.   ESTIMATED BLOOD LOSS: 25 mL   INDICATION  FOR PROCEDURE: This is a 64 year old female who I have already treated for her right lower extremity rest pain. She has multiple medical comorbidities. She also has a known right renal artery occlusion and a left renal artery stenosis. We performed angioplasty on multiple levels on the right including the right iliac vessel. She has residual stenosis in the proximal right common iliac artery, but due to the location and some aneurysmal degeneration of the right common iliac artery just below this, this was not going to be a good location for standard percutaneous treatment with balloon or stent. The left lower extremity is still bothering her. She does not have severe pain on the left, but she does have short distance claudication. Her results on the right have been reasonably good, and she desires to have intervention on the left. Risks and benefits were discussed. Informed consent was obtained.   DESCRIPTION OF PROCEDURE: The patient was brought to the vascular suite. Groins were shaved and prepped. A sterile surgical field was created. The right femoral head was localized with fluoroscopy. Ultrasound was used to visualize the right femoral artery. It was accessed with a micropuncture needle. A micropuncture wire and sheath were then placed. We sequentially dilated up to a 5 [place] sheath, and a pigtail catheter was placed in the aorta and an AP aortogram was performed. This demonstrated the known right renal artery occlusion, the left renal artery stenosis appeared moderate to high-grade. The configuration of the right common iliac artery aneurysm just below the stenosis at the origin of the right common iliac artery was again seen. The previous right external iliac artery angioplasty appeared to be intact with  good flow. The left iliac artery did not appear to have any flow-limiting stenosis.   I then crossed the aortic bifurcation and advanced to the left femoral head. Selective left lower extremity  angiogram was then performed. This showed a near occlusive stenosis at the origin of the left SFA. The SFA was small but patent over a segment; it then became stenotic in the mid SFA and occluded in the mid to distal SFA, separate and distinct from the proximal stenosis. The popliteal artery reconstituted, but it had a stenosis below the occlusion. There was then what appeared to be 2-3 vessel runoff distally, although the opacification was reasonably slow.   A 6 French Ansell sheath was placed over a [employer] wire and 4000 units of intravenous heparin were given for systemic anticoagulation. I was able to cross the proximal SFA stenosis without difficulty to gain access to the mid to distal SFA stenosis and the occlusion, which was separate and distinct. It was also easily crossed, as was the popliteal stenosis. I confirmed successful intraluminal crossing with a catheter in the mid popliteal artery. I then replaced the wire. Her vessels were small. I performed percutaneous transluminal angioplasty with a 4 mm diameter drug-coated angioplasty balloon to the popliteal artery and distal SFA. I went ahead and treated the mid and proximal SFA with 4 mm diameter balloon as well, and then treated the proximal SFA with a 5 mm diameter drug-coated angioplasty balloon. This was separate and distinct to a more distal occlusion. Following this, there were multiple areas of residual stenosis and dissection. The flow was not significantly improved from the previous occlusion. I elected to cover the proximal and mid SFA up to about 2-3 cm from the origin with a 6 mm diameter x 20 cm in length LifeStent. Care was taken to avoid encroaching upon the profunda femoris artery, and we remained a few centimeters away from this. Below this, there was still residual stenosis and dissection in the distal SFA, an above-knee popliteal artery, and an additional 6 mm diameter x 8 cm in length self-expanding stent was required for  this separate and distinct lesion. These were post dilated with a 5 mm balloon with a good angiographic completion result.   At this point I elected to terminate the procedure. The sheath was removed. StarClose closure device was deployed in the usual fashion with excellent hemostatic result. The patient tolerated the procedure well and was taken to the recovery room in stable condition.    ____________________________ Algernon Huxley, MD jsd:MT D: 08/22/2014 12:10:23 ET T: 08/22/2014 15:39:30 ET JOB#: [id number]  cc: Algernon Huxley, MD, <Dictator> Sofie Hartigan, MD Algernon Huxley MD ELECTRONICALLY SIGNED 09/08/2014 14:18

## 2015-01-01 NOTE — H&P (Signed)
PATIENT NAME:  Cheryl Hamilton, Cheryl Hamilton MR#:  [id number] DATE OF BIRTH:  11/18/1950  DATE OF ADMISSION:  02/04/2012  CHIEF COMPLAINT: Abnormal microcalcifications of the left breast.  HISTORY OF PRESENT ILLNESS: This is a patient who does self examinations not noticing any new masses but a routine mammogram demonstrated new calcifications very close to the chest wall not amenable to percutaneous minimally invasive techniques. She is here for elective excisional biopsy utilizing localizing techniques on the left breast for abnormal mammogram. She has had no pain and no discharge. She has had a prior stereotactic biopsy which was benign per the patient.   PAST MEDICAL HISTORY:  1. Scleroderma. 2. Diabetes. 3. Ovarian cancer.  4. Osteoporosis.  5. Kidney stones.   PAST SURGICAL HISTORY:  1. Breast biopsy. 2. Laminectomy. 3. Neck surgery. 4. Hysterectomy. 5. Sinus surgery.   MEDICATIONS: Multiple, see chart.   FAMILY HISTORY: Ovarian cancer.   ALLERGIES: None.   SOCIAL HISTORY: Patient smokes tobacco in spite of her severe medical diseases. She does not drink alcohol. She is divorced.   REVIEW OF SYSTEMS: 10 system review has been performed and is documented in the office chart.   PHYSICAL EXAMINATION:  GENERAL: Chronically ill-appearing Caucasian female patient with obvious scleroderma changes. Her skin actually looks gray in color.   HEENT: No scleral icterus.   NECK: No palpable neck nodes.   CHEST: Clear to auscultation.   CARDIAC: Regular rate and rhythm.   ABDOMEN: Soft, nontender.   EXTREMITIES: Without edema.   NEUROLOGIC: Grossly intact.   INTEGUMENT: Obvious changes from scleroderma.   BREASTS: Examination shows no mass in either breast. No axillary adenopathy.Marland Kitchen   LABORATORY, DIAGNOSTIC AND RADIOLOGICAL DATA: Mammogram on the left demonstrates new pleomorphic calcifications close to the chest wall.   Laboratory values are within normal limits.   ASSESSMENT AND  PLAN: This is a patient with new calcifications pleomorphic in nature close to the chest wall requiring biopsy. She is not a candidate for stereotactic due to the close proximity to the chest wall and the inability to localize. For that reason needle localization technique will be utilized and excisional biopsy will be performed.   The rational for this has been discussed. The options of observation have been reviewed and the risks of bleeding, infection, recurrence, cosmetic deformity,missed lesion, hematoma and seroma and additional surgery have all been discussed with her. This will also be reviewed again with her in the preop holding area.   She agrees with this plan.   ____________________________ Jerrol Banana. Burt Knack, MD rec:cms D: 02/03/2012 23:04:45 ET T: 02/04/2012 08:26:23 ET JOB#: [id number]  cc: Jerrol Banana. Burt Knack, MD, <Dictator> Florene Glen MD ELECTRONICALLY SIGNED 02/04/2012 11:37

## 2015-01-01 NOTE — Op Note (Signed)
PATIENT NAME:  [name], Cheryl Hamilton MR#:  [id number] DATE OF BIRTH:  February 19, 1951  DATE OF PROCEDURE:  02/04/2012  PREOPERATIVE DIAGNOSIS: Left breast microcalcifications.   POSTOPERATIVE DIAGNOSIS: Left breast microcalcifications.   PROCEDURE: Left needle localization breast biopsy.   SURGEON: Jasmene Goswami E. Burt Knack, MD  ANESTHESIA: General with LMA.   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 The rationale for this has been discussed. The options of observation were reviewed, the risk of bleeding, infection, hematoma, seroma, cosmetic deformity, missed lesion and her higher risk due to her scleroderma were all reviewed with her. She understood and agreed to proceed. This was reviewed for her in the preop holding area as well.   FINDINGS: Specimen mammogram was read by Dr. Burt Knack reported intraoperatively by Maudie Mercury from radiology stating that the specimen mammogram contained the calcifications.   DESCRIPTION OF PROCEDURE: Patient was induced to general anesthesia, prepped and draped in sterile fashion. Local anesthetic was infiltrated in skin and subcutaneous tissues around the previously placed needle localization wire. An incision was made. Dissection down following the wire was performed. The incision needed to be enlarged in the inframammary fold area due to the fact that the needle traversed a large portion of the inferior portion of the breast. Tracing the needle out excision was performed. Additional tissue near the wire was excised as well. This was sent off for examination.   Dr. Burt Knack via Maudie Mercury in radiology called back stating that the specimen mammogram indeed did contain the targeted calcifications.   The area is checked for hemostasis and found to be adequate. Additional Marcaine was infiltrated into the cavity walls for a total of 30 mL and then once  assuring that hemostasis was adequate and checked multiple times the wound was closed with deep sutures of 3-0 Vicryl running suture followed by 4-0 subcuticular Monocryl. Steri-Strips, Mastisol, and sterile dressings were placed.   Patient tolerated the procedure well. There were no complications. She was taken to the recovery room in stable condition to be discharged in care of care of her family. Follow up in 10 days.   ____________________________ Jerrol Banana Burt Knack, MD rec:cms D: 02/04/2012 10:25:12 ET T: 02/04/2012 11:18:31 ET JOB#: [id number]  cc: Jerrol Banana. Burt Knack, MD, <Dictator> Florene Glen MD ELECTRONICALLY SIGNED 02/04/2012 11:37

## 2015-01-08 NOTE — Consult Note (Signed)
PATIENT NAME:  Cheryl Hamilton, Cheryl Hamilton MR#:  [id number] DATE OF BIRTH:  1951/07/30  DATE OF CONSULTATION:  11/24/2014  REFERRING PHYSICIAN:   CONSULTING PHYSICIAN:  Dionisio David, MD  INDICATION FOR CONSULTATION: Mildly elevated troponin and shortness of breath.   HISTORY OF PRESENT ILLNESS: This is a 64 year old white female with a past medical history of scleroderma, CHF, pulmonary hypertension and coronary artery disease who presented to the Emergency Room with shortness of breath. She had trouble lying flat at night and she started having swelling of the legs. She denied any chest pain.   PAST MEDICAL HISTORY: Systolic heart failure, scleroderma, Reynaud disease, hypertension, renal failure, coronary artery disease status post PCI and stenting in the past, pulmonary hypertension.   MEDICATIONS: Protonix, Corlanor, Coreg, Plavix, aspirin, Lasix, fenofibrate.    SOCIAL HISTORY: She smokes about 2 to 3 cigarettes a day. No EtOH abuse.   FAMILY HISTORY: Positive for hypertension.   PHYSICAL EXAMINATION: GENERAL: She is alert and oriented x3, in no acute distress.  VITAL SIGNS: Blood pressure 122/90, respirations 21, pulse 101, temperature 97.7, saturation 99.  NECK: Reveals 7 cm JVD.  LUNGS: A few crepitation at the bases.  HEART: Regular rate and rhythm. Normal S1, S2. No audible murmur.  ABDOMEN: Soft, nontender. Positive bowel sounds.  EXTREMITIES: No pedal edema.  NEUROLOGIC: She appears to be intact.   DIAGNOSTIC DATA: EKG shows sinus rhythm, 83 beats per minute, biatrial enlargement, nonspecific ST-T changes.   ASSESSMENT AND PLAN: The patient has mildly elevated troponin of 0.15. The rest of the labs are unremarkable, except hemoglobin is 10. Presented with acute shortness of breath with elevated BNP. Advise giving the patient Lasix 40 IV q. 8 along with Levaquin, continue Coreg. Agree with starting hydralazine for blood pressure.   We will follow the patient closely with you. Thank  you very much for the referral.   ____________________________ Dionisio David, MD sak:sb D: 11/24/2014 15:20:00 ET T: 11/24/2014 15:34:15 ET JOB#: [id number]  cc: Dionisio David, MD, <Dictator> Dionisio David MD ELECTRONICALLY SIGNED 12/20/2014 13:32

## 2015-01-08 NOTE — H&P (Signed)
PATIENT NAME:  Cheryl Hamilton, Cheryl Hamilton MR#:  [id number] DATE OF BIRTH:  15-Jul-1951  DATE OF ADMISSION:  11/24/2014  PRIMARY CARE PROVIDER: Leafy Half, MD  EMERGENCY DEPARTMENT REFERRING PHYSICIAN: Dr. Jimmye Norman  CHIEF COMPLAINT: Shortness of breath, cough.   HISTORY OF PRESENT ILLNESS: The patient is a 64 year old white female with history of systolic CHF, scleroderma, history of pulmonary hypertension, history of coronary artery disease, history of chronic blue skin discoloration who has been having shortness of breath for the past few days. She was seen by her cardiologist and was started on ivabradine; however, despite being started on that she continued to have shortness of breath. She could not lie flat. She started having swelling of her lower extremity. Therefore, she came to the ED. Her chest x-ray suggests congestive heart failure. The patient also has been having cough for the past few days as well but has not had any wheezing. No fevers or chills. Denies any nausea, vomiting, diarrhea. Denies any urinary symptoms.   PAST MEDICAL HISTORY: 1.  Systolic CHF. 2.  Scleroderma. 3.  Reynaud syndrome. 4.  Hypertension.  5.  Acute renal failure.  6.  Chronic back pain status post back surgery in the 1990s.  7.  GERD.  8.  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 9.  Coronary artery disease status post multiple stents.  10.  Pulmonary hypertension.  11.  Recent fall leading to clavicular fracture and rib fractures requiring hospitalization at [facility].  12.  Ovarian cancer and vulvar cancer status post chemo and radiation.  13.  COPD.   PAST SURGICAL HISTORY: Back surgery.   ALLERGIES: None.   MEDICATIONS: Plaquenil 200 one tab p.o. b.i.d., Protonix 40 daily, nystatin triamcinolone applied topically to effected area 3 times a day, melatonin 3 mg daily, lidocaine topically to effected area 3 times a day, Lasix 20 daily, fenofibrate 145 p.o. daily, vitamin D 50,000 international units once a  week, Dulcolax laxative as needed, Corlanor 5 mg 1 tab p.o. b.i.d., Coreg 25 one tab p.o. b.i.d., Plavix 75 p.o. daily, cetirizine 5 mg daily, saline nasal 2 sprays 4 times a day, aspirin 81 one tab p.o. daily, Tylenol 325 one tab p.o. t.i.d.   SOCIAL HISTORY: Smokes 2 to 3 cigarettes a day. No alcohol or drug use.   FAMILY HISTORY: Positive for hypertension.   REVIEW OF SYSTEMS: CONSTITUTIONAL: Denies any fevers. Complains of fatigue, weakness. No weight loss or weight gain.  EYES: No blurred or double vision. No pain. No redness. No inflammation.  ENT: No tinnitus. No ear pain. No hearing loss. Has seasonal allergies. No difficulty swallowing.  RESPIRATORY: Complains of cough, but no wheezing. No hemoptysis. Has dyspnea. Chest x-ray suggestive of COPD.  GENITOURINARY: Denies any frequency, urgency or hesitancy. ENDOCRINE: Denies any polyuria, nocturia, or thyroid problems. HEMATOLOGIC AND LYMPHATIC: Denies anemia or easy bruisability or bleeding.  SKIN: Chronic blue discoloration as a result, according to her, medications. MUSCULOSKELETAL:  Has chronic back pain. NEUROLOGIC: No CVA, TIA.  PSYCHIATRIC: No depression or anxiety.   PHYSICAL EXAMINATION: VITAL SIGNS: Temperature 97.7, pulse 85, respirations 20, blood pressure 163/100.  GENERAL: The patient is a well-developed female, has bluish discoloration throughout her skin, is not in any acute distress.  HEENT: Head atraumatic, normocephalic. Pupils equally round and reactive to light and accommodation. There is no conjunctival pallor. No sclerae icterus. Nasal exam shows no drainage or ulceration. Oropharynx is clear without any exudate.  NECK: Supple without any thyromegaly.  CARDIOVASCULAR: Regular rate and rhythm. No  murmurs, rubs, clicks, or gallops.  LUNGS: Has bilateral crackles at the bases.  ABDOMEN: Soft, nontender, nondistended. Positive bowel sounds x4. No hepatosplenomegaly.  EXTREMITIES: 1+ edema.  SKIN: Has bluish  discoloration. NEUROLOGIC: Awake, alert and oriented x3. No focal deficits. No lymph nodes palpable.  VASCULAR: Good DP and PT pulses.  PSYCHIATRIC: Not anxious or depressed.   DIAGNOSTIC DATA: PA and lateral chest x-ray shows COPD changes with probable CHF.   WBC 8.9, hemoglobin 10, platelet count 226,000. Glucose 175, BUN 13, creatinine 1.03, sodium 140, potassium 4.1, chloride 106, CO2 21, calcium 8.7. Troponin 0.15.   ASSESSMENT AND PLAN: The patient is a 64 year old with history of scleroderma, systolic congestive heart failure, coronary artery disease, chronic obstructive pulmonary disease, and blue skin discoloration presents with shortness of breath, cough for the past few days. 1.  Acute respiratory failure due to acute systolic congestive heart failure exacerbation. At this time, we will treat her with IV Lasix. She may also have acute bronchitis. Will treat her with p.o. Levaquin.  2.  Accelerated hypertension. Continue Coreg. Will start on p.r.n. hydralazine, add additional medication if needed. 3.  Scleroderma. Continue Plaquenil as taking at home. 4.  Coronary artery disease with elevated troponin. We will ask cardiology to see the patient. Continue her Plavix. 5.  Cough, possible bronchitis. Antitussives and Plaquenil.  6.  Miscellaneous. The patient will be on Lovenox for deep vein thrombosis prophylaxis.  TIME SPENT: 50 minutes.  ____________________________ Lafonda Mosses Posey Pronto, MD shp:sb D: 11/24/2014 13:37:18 ET T: 11/24/2014 14:11:06 ET JOB#: [id number]  cc: Kalimah Capurro H. Posey Pronto, MD, <Dictator> Alric Seton MD ELECTRONICALLY SIGNED 12/09/2014 14:12

## 2015-01-08 NOTE — Discharge Summary (Signed)
PATIENT NAME:  Cheryl, Hamilton MR#:  [id number] DATE OF BIRTH:  June 12, 1951  DATE OF ADMISSION:  11/24/2014 DATE OF DISCHARGE:  11/27/2014  DISCHARGE DIAGNOSIS:  1.  Acute on chronic systolic heart failure, slightly elevated troponins.  2.  Systemic scleroderma.  DISCHARGE MEDICATIONS: 1.  Aspirin 81 mg p.o. daily. 2.  Coreg 25 mg p.o. b.i.d.  3.  (pantoprazole 40 mg po daily 4.  Lasix 20 mg p.o. daily. 5.  Nystatin with triamcinolone apply topically 3 times daily. 6.  Plaquenil 200 mg p.o. b.i.d.  7.  Cetirizine 5 mg p.o. daily. 8.  Fenofibrate 145 mg p.o. daily. 9.  Acetaminophen 325 mg p.o. t.i.d. 10.  Lidocaine topical 5% ointment to effected area t.i.d.  11.  Melatonin 3 mg p.o. at bedtime.  12.  Cholecalciferol 50,000 units once a week. 13.  Dulcolax suppository as needed for constipation. 14.  Plavix 75 mg p.o. daily. 15.  Corlanor 5 mg p.o. b.i.d.  16.  Aldactone 25 mg p.o. daily. 17.  Levaquin 250 mg p.o. daily for 5 days.  18.Proair 2 puffs 4 x daily.  DISCHARGE DIET: Low-sodium, low-fat.   CONSULTATIONS: Cardiology with Dr. Neoma Laming.   HOSPITAL COURSE:  1.  The patient is a 64 year old female with history of systolic CHF, scleroderma, diastolic heart failure, pulmonary hypertension, coronary artery disease comes in because of shortness of breath. She had orthopnea and PND as well. The patient had CAD with PCI and stent before. She had elevated BNP of 1514 on admission. She was admitted to the hospitalist service for her CHF exacerbation. The patient was started on IV Lasix 40 mg b.i.d. Her symptoms nicely improved. The patient received 40 mg IV b.i.d. since admission. She had very good improvement in her symptoms. She had an echo done last year in March that showed EF of 30% to 35% with elevated diastolic pressures. She did not have repeat echo.  2.  Non-ST elevation MI. Troponins were slightly up at 0.17 and 0.13. Seen by Dr. Neoma Laming. He suggested elevated  troponins are due to CHF exacerbation and he did not suggest any further work-up. The patient needs outpatient followup with him and he advised to continue her home medications of aspirin, beta blockers, statins, ACE inhibitors and ARBs for her chronic problems and use IV Lasix for acute CHF. The patient did not have any EKG changes. Her symptoms nicely improved and she was able to go home on Lasix 20 mg daily.  3.  History of systemic scleroderma. The patient is on a medication called Corlanor 5 mg b.i.d. and the patient follows up with her dermatologist at [facility]. Her family doctor is Dr. Dorann Ou.   4.  History of ovarian cancer and vulvar cancer status post chemo and radiation therapy. The patient has discoloration of the skin secondary to minocycline.  5.  She also had some cough so she was given 5 doses of Levaquin. 6.  Accelerated hypertension. The patient's blood pressure was elevated on admission. Blood pressure was 163/100. She did receive some p.r.n. hydralazine.   DISCHARGE PHYSICAL EXAMINATION: VITAL SIGNS: Temperature 98.4, heart rate 70, blood pressure 120/77, sats 95% on room air. CARDIOVASCULAR: S1 and S2 regular. LUNGS: Clear to auscultation. No wheeze. No rales.  ABDOMEN: Soft, nontender, nondistended. Bowel sounds present.  EXTREMITIES: No extremity edema.  TIME SPENT ON DISCHARGE PREPARATION: More than 30 minutes.   ____________________________ Epifanio Lesches, MD sk:sb D: 11/28/2014 18:33:38 ET T: 11/29/2014 07:09:08 ET JOB#: [id number]  cc:  Epifanio Lesches, MD, <Dictator> Sofie Hartigan, MD Epifanio Lesches MD ELECTRONICALLY SIGNED 12/01/2014 12:30

## 2015-01-09 ENCOUNTER — Other Ambulatory Visit
Admission: RE | Admit: 2015-01-09 | Discharge: 2015-01-09 | Disposition: A | Discharge status: Home / Self Care | Payer: Commercial Managed Care - HMO | Source: Ambulatory Visit | Attending: Cardiovascular Disease | Admitting: Cardiovascular Disease

## 2015-01-09 DIAGNOSIS — Z01812 Encounter for preprocedural laboratory examination: Secondary | ICD-10-CM | POA: Insufficient documentation

## 2015-01-09 LAB — HEMOGLOBIN AND HEMATOCRIT, BLOOD
HEMATOCRIT: 35.2 % (ref 35.0–47.0)
Hemoglobin: 10.8 g/dL — ABNORMAL LOW (ref 12.0–16.0)

## 2015-01-09 LAB — BUN: BUN: 32 mg/dL — AB (ref 6–20)

## 2015-01-09 LAB — CREATININE, SERUM
CREATININE: 1.35 mg/dL — AB (ref 0.44–1.00)
GFR calc non Af Amer: 41 mL/min — ABNORMAL LOW (ref >60)
GFR, EST AFRICAN AMERICAN: 47 mL/min — AB (ref >60)

## 2015-01-09 LAB — POTASSIUM: POTASSIUM: 5.2 mmol/L — AB (ref 3.5–5.1)

## 2015-01-10 ENCOUNTER — Encounter
Admission: RE | Disposition: A | Discharge status: Home / Self Care | Payer: Commercial Managed Care - HMO | Source: Other Acute Inpatient Hospital | Attending: Cardiovascular Disease

## 2015-01-10 ENCOUNTER — Ambulatory Visit
Admission: RE | Admit: 2015-01-10 | Discharge: 2015-01-10 | Disposition: A | Discharge status: Home / Self Care | Payer: 59 | Source: Other Acute Inpatient Hospital | Attending: Cardiovascular Disease | Admitting: Cardiovascular Disease

## 2015-01-10 ENCOUNTER — Encounter: Payer: Self-pay | Admitting: *Deleted

## 2015-01-10 DIAGNOSIS — Z87891 Personal history of nicotine dependence: Secondary | ICD-10-CM | POA: Diagnosis not present

## 2015-01-10 DIAGNOSIS — E119 Type 2 diabetes mellitus without complications: Secondary | ICD-10-CM | POA: Insufficient documentation

## 2015-01-10 DIAGNOSIS — I10 Essential (primary) hypertension: Secondary | ICD-10-CM | POA: Diagnosis not present

## 2015-01-10 DIAGNOSIS — I2510 Atherosclerotic heart disease of native coronary artery without angina pectoris: Secondary | ICD-10-CM | POA: Diagnosis not present

## 2015-01-10 DIAGNOSIS — R609 Edema, unspecified: Secondary | ICD-10-CM | POA: Insufficient documentation

## 2015-01-10 DIAGNOSIS — E785 Hyperlipidemia, unspecified: Secondary | ICD-10-CM | POA: Diagnosis not present

## 2015-01-10 DIAGNOSIS — R0602 Shortness of breath: Secondary | ICD-10-CM | POA: Diagnosis not present

## 2015-01-10 DIAGNOSIS — Z7982 Long term (current) use of aspirin: Secondary | ICD-10-CM | POA: Insufficient documentation

## 2015-01-10 DIAGNOSIS — R0600 Dyspnea, unspecified: Secondary | ICD-10-CM | POA: Insufficient documentation

## 2015-01-10 DIAGNOSIS — Z79899 Other long term (current) drug therapy: Secondary | ICD-10-CM | POA: Diagnosis not present

## 2015-01-10 DIAGNOSIS — I429 Cardiomyopathy, unspecified: Secondary | ICD-10-CM | POA: Diagnosis not present

## 2015-01-10 DIAGNOSIS — I5022 Chronic systolic (congestive) heart failure: Secondary | ICD-10-CM | POA: Diagnosis present

## 2015-01-10 DIAGNOSIS — Z955 Presence of coronary angioplasty implant and graft: Secondary | ICD-10-CM | POA: Diagnosis not present

## 2015-01-10 DIAGNOSIS — I7300 Raynaud's syndrome without gangrene: Secondary | ICD-10-CM | POA: Insufficient documentation

## 2015-01-10 HISTORY — DX: Angina pectoris, unspecified: I20.9

## 2015-01-10 HISTORY — DX: Reserved for inherently not codable concepts without codable children: IMO0001

## 2015-01-10 HISTORY — DX: Edema, unspecified: R60.9

## 2015-01-10 HISTORY — DX: Hyperlipidemia, unspecified: E78.5

## 2015-01-10 HISTORY — PX: CARDIAC CATHETERIZATION: SHX172

## 2015-01-10 SURGERY — LEFT HEART CATH AND CORONARY ANGIOGRAPHY
Anesthesia: Moderate Sedation

## 2015-01-10 SURGERY — LEFT HEART CATH
Anesthesia: Moderate Sedation

## 2015-01-10 MED ORDER — IOPAMIDOL (ISOVUE-370) INJECTION 76%
INTRAVENOUS | Status: DC | PRN
  Administered 2015-01-10: 75 mL
  Administered 2015-01-10: 100 mL

## 2015-01-10 MED ORDER — MIDAZOLAM HCL 2 MG/2ML IJ SOLN
INTRAMUSCULAR | Status: DC | PRN
  Administered 2015-01-10: 1 mg via INTRAVENOUS

## 2015-01-10 MED ORDER — SODIUM CHLORIDE 0.9 % IV SOLN
INTRAVENOUS | Status: DC
  Administered 2015-01-10: 09:00:00 via INTRAVENOUS

## 2015-01-10 MED ORDER — FENTANYL CITRATE (PF) 100 MCG/2ML IJ SOLN
INTRAMUSCULAR | Status: DC | PRN
  Administered 2015-01-10 (×2): 50 ug via INTRAVENOUS

## 2015-01-10 SURGICAL SUPPLY — 10 items
CATH INFINITI 5 FR JL3.5 (CATHETERS) ×3
CATH INFINITI 5FR ANG PIGTAIL (CATHETERS) ×6
CATH INFINITI 5FR JL4 (CATHETERS) ×3
CATH INFINITI JR4 5F (CATHETERS) ×3
DEVICE CLOSURE MYNXGRIP 5F (Vascular Products) ×3 IMPLANT
KIT MANI 3VAL PERCEP (MISCELLANEOUS) ×3
NEEDLE PERC 18GX7CM (NEEDLE) ×3
PACK CARDIAC CATH (CUSTOM PROCEDURE TRAY) ×3
SHEATH PINNACLE 5F 10CM (SHEATH) ×3
WIRE EMERALD 3MM-J .035X150CM (WIRE) ×3

## 2015-01-10 NOTE — Progress Notes (Signed)
   SUBJECTIVE: patient doing well   Filed Vitals:   01/10/15 0800  BP: 119/53  Pulse: 111  Temp: 98.1 F (36.7 C)  TempSrc: Oral  Resp: 12  Height: 5' (1.524 m)  Weight: 56.246 kg (124 lb)   No intake or output data in the 24 hours ending 01/10/15 0943  LABS: Basic Metabolic Panel:  Recent Labs  01/09/15 1309  K 5.2*  BUN 32*  CREATININE 1.35*   Liver Function Tests: No results for input(s): AST, ALT, ALKPHOS, BILITOT, PROT, ALBUMIN in the last 72 hours. No results for input(s): LIPASE, AMYLASE in the last 72 hours. CBC:  Recent Labs  01/09/15 1309  HGB 10.8*  HCT 35.2   Cardiac Enzymes: No results for input(s): CKTOTAL, CKMB, CKMBINDEX, TROPONINI in the last 72 hours. BNP: Invalid input(s): POCBNP D-Dimer: No results for input(s): DDIMER in the last 72 hours. Hemoglobin A1C: No results for input(s): HGBA1C in the last 72 hours. Fasting Lipid Panel: No results for input(s): CHOL, HDL, LDLCALC, TRIG, CHOLHDL, LDLDIRECT in the last 72 hours. Thyroid Function Tests: No results for input(s): TSH, T4TOTAL, T3FREE, THYROIDAB in the last 72 hours.  Invalid input(s): FREET3 Anemia Panel: No results for input(s): VITAMINB12, FOLATE, FERRITIN, TIBC, IRON, RETICCTPCT in the last 72 hours.   PHYSICAL EXAM General: Well developed, well nourished, in no acute distress HEENT:  Normocephalic and atramatic Neck:  No JVD.  Lungs: Clear bilaterally to auscultation and percussion. Heart: HRRR . Normal S1 and S2 without gallops or murmurs.  Abdomen: Bowel sounds are positive, abdomen soft and non-tender  Msk:  Back normal, normal gait. Normal strength and tone for age. Extremities: No clubbing, cyanosis or edema.   Neuro: Alert and oriented X 3. Psych:  Good affect, responds appropriately  TELEMETRY: Reviewed telemetry pt in ns:  ASSESSMENT AND PLAN:  Active Problems:   Coronary artery dissease  Rosaleigh Brazzel A, MD, [facility] 01/10/2015 9:43 AM

## 2015-01-10 NOTE — Discharge Instructions (Addendum)
Physician Discharge Summary  Patient ID: Cheryl Hamilton MRN: [id number] DOB/AGE: June 24, 1951 64 y.o.  Admit date: 01/10/2015 Discharge date: 01/10/2015  Primary Discharge Diagnosis CAD Secondary Discharge Diagnosis  Significant Diagnostic Studies:   Consults:   Hospital Course:   Discharge Exam: Blood pressure 119/53, pulse 111, temperature 98.1 F (36.7 C), temperature source Oral, resp. rate 12, height 5' (1.524 m), weight 56.246 kg (124 lb).   This SmartLink requires parameters. Parameters are variables that are added to the [facility] name to request specific information. The parameter for lastwt is the number of readings to display.  For example: .lastwt[4  In this example, the [place] displays the last four encounter readings.    Labs:   Lab Results  Component Value Date   WBC 8.3 11/26/2013   HGB 10.8* 01/09/2015   HCT 35.2 01/09/2015   MCV 91 11/26/2013   PLT 197 11/26/2013    Recent Labs Lab 01/09/15 1309  K 5.2*  BUN 32*  CREATININE 1.35*      Radiology:  EKG:   FOLLOW UP PLANS AND APPOINTMENTS    Medication List    ASK your doctor about these medications        acetaminophen 325 MG tablet  Commonly known as:  TYLENOL  Take 325 mg by mouth every 4 (four) hours as needed.     albuterol 108 (90 BASE) MCG/ACT inhaler  Commonly known as:  PROVENTIL HFA;VENTOLIN HFA  Inhale 2 puffs into the lungs every 6 (six) hours as needed for wheezing or shortness of breath.     aspirin EC 81 MG tablet  Take 81 mg by mouth daily.     bisacodyl 10 MG suppository  Commonly known as:  DULCOLAX  Place 10 mg rectally daily as needed for moderate constipation.     carvedilol 25 MG tablet  Commonly known as:  COREG  Take 25 mg by mouth 2 (two) times daily with a meal.     cetirizine 5 MG tablet  Commonly known as:  ZYRTEC  Take 5 mg by mouth daily.     clopidogrel 75 MG tablet  Commonly known as:  PLAVIX  Take 75 mg by mouth daily.     ergocalciferol 50000 UNITS capsule  Commonly known as:  VITAMIN D2  Take 50,000 Units by mouth once a week.     fenofibrate 145 MG tablet  Commonly known as:  TRICOR  Take 145 mg by mouth daily.     furosemide 20 MG tablet  Commonly known as:  LASIX  Take 20 mg by mouth daily.     hydroxychloroquine 200 MG tablet  Commonly known as:  PLAQUENIL  Take 200 mg by mouth 2 (two) times daily.     ivabradine 5 MG Tabs tablet  Commonly known as:  CORLANOR  Take 5 mg by mouth 2 (two) times daily with a meal.     lidocaine 5 % ointment  Commonly known as:  XYLOCAINE  Apply 1 application topically 3 (three) times daily as needed.     Melatonin 3 MG Tabs  Take 1 tablet by mouth at bedtime.     nystatin-triamcinolone cream  Commonly known as:  MYCOLOG II  Apply 1 application topically 3 (three) times daily.     pantoprazole 40 MG tablet  Commonly known as:  PROTONIX  Take 40 mg by mouth 2 (two) times daily.     simvastatin 40 MG tablet  Commonly known as:  ZOCOR  Take 40 mg  by mouth daily.     sodium chloride 0.65 % Soln nasal spray  Commonly known as:  OCEAN  Place 2 sprays into both nostrils 4 (four) times daily.     spironolactone 25 MG tablet  Commonly known as:  ALDACTONE  Take 25 mg by mouth daily.     triamcinolone 55 MCG/ACT Aero nasal inhaler  Commonly known as:  NASACORT  Place 2 sprays into the nose daily.           Follow-up Information    Follow up with [facility] A, MD. Schedule an appointment as soon as possible for a visit in 1 week.   Specialty:  Cardiology   Why:  post cath   Contact information:   [place] [address] [phone]       BRING ALL MEDICATIONS WITH YOU TO FOLLOW UP APPOINTMENTS  Time spent with patient to include physician time: 2 hours Signed:  Dionisio David MD, [facility] 01/10/2015, 9:45 AM    Angiogram An angiogram, also called angiography, is a procedure used to look at the blood vessels that carry blood  to different parts of your body (arteries). In this procedure, dye is injected through a long, thin tube (catheter) into an artery. X-rays are then taken. The X-rays will show if there is a blockage or problem in a blood vessel.  LET [facility] CARE PROVIDER KNOW ABOUT:  Any allergies you have, including allergies to shellfish or contrast dye.   All medicines you are taking, including vitamins, herbs, eye drops, creams, and over-the-counter medicines.   Previous problems you or members of your family have had with the use of anesthetics.   Any blood disorders you have.   Previous surgeries you have had.  Any previous kidney problems or failure you have had.  Medical conditions you have.   Possibility of pregnancy, if this applies. RISKS AND COMPLICATIONS Generally, an angiogram is a safe procedure. However, as with any procedure, problems can occur. Possible problems include:  Injury to the blood vessels, including rupture or bleeding.  Infection or bruising at the catheter site.  Allergic reaction to the dye or contrast used.  Kidney damage from the dye or contrast used.  Blood clots that can lead to a stroke or heart attack. BEFORE THE PROCEDURE  Do not eat or drink after midnight on the night before the procedure, or as directed by your health care provider.   Ask your health care provider if you may drink enough water to take any needed medicines the morning of the procedure.  PROCEDURE  You may be given a medicine to help you relax (sedative) before and during the procedure. This medicine is given through an IV access tube that is inserted into one of your veins.   The area where the catheter will be inserted will be washed and shaved. This is usually done in the groin but may be done in the fold of your arm (near your elbow) or in the wrist.  A medicine will be given to numb the area where the catheter will be inserted (local anesthetic).  The catheter will  be inserted with a guide wire into an artery. The catheter is guided by using a type of X-ray (fluoroscopy) to the blood vessel being examined.   Dye is then injected into the catheter, and X-rays are taken. The dye helps to show where any narrowing or blockages are located.  AFTER THE PROCEDURE   If the procedure is done  through the leg, you will be kept in bed lying flat for several hours. You will be instructed to not bend or cross your legs.  The insertion site will be checked frequently.  The pulse in your feet or wrist will be checked frequently.  Additional blood tests, X-rays, and electrocardiography may be done.   You may need to stay in the hospital overnight for observation.  Document Released: 06/05/2005 Document Revised: 08/31/2013 Document Reviewed: 01/27/2013 [facility] Patient Information 2015 [place], [place]. This information is not intended to replace advice given to you by your health care provider. Make sure you discuss any questions you have with your health care provider.   Follow up appt given

## 2015-01-13 ENCOUNTER — Ambulatory Visit: Payer: Self-pay | Admitting: Family

## 2015-01-17 ENCOUNTER — Encounter: Payer: Self-pay | Admitting: Cardiovascular Disease

## 2015-03-29 ENCOUNTER — Inpatient Hospital Stay: Payer: 59

## 2015-04-18 ENCOUNTER — Other Ambulatory Visit: Payer: Self-pay | Admitting: Family Medicine

## 2015-04-18 DIAGNOSIS — Z1231 Encounter for screening mammogram for malignant neoplasm of breast: Secondary | ICD-10-CM

## 2015-04-19 ENCOUNTER — Ambulatory Visit
Admission: RE | Admit: 2015-04-19 | Discharge: 2015-04-19 | Disposition: A | Discharge status: Home / Self Care | Payer: 59 | Source: Ambulatory Visit | Attending: Family Medicine | Admitting: Family Medicine

## 2015-04-19 DIAGNOSIS — Z1231 Encounter for screening mammogram for malignant neoplasm of breast: Secondary | ICD-10-CM | POA: Diagnosis not present

## 2015-04-24 ENCOUNTER — Other Ambulatory Visit: Payer: Self-pay | Admitting: Family Medicine

## 2015-04-24 DIAGNOSIS — R928 Other abnormal and inconclusive findings on diagnostic imaging of breast: Secondary | ICD-10-CM

## 2015-04-24 DIAGNOSIS — N6489 Other specified disorders of breast: Secondary | ICD-10-CM

## 2015-04-26 ENCOUNTER — Inpatient Hospital Stay: Payer: 59 | Attending: Obstetrics and Gynecology | Admitting: Obstetrics and Gynecology

## 2015-04-26 VITALS — BP 135/77 | HR 74 | Temp 98.1°F | Resp 18 | Ht 60.0 in | Wt 131.3 lb

## 2015-04-26 DIAGNOSIS — E785 Hyperlipidemia, unspecified: Secondary | ICD-10-CM | POA: Diagnosis not present

## 2015-04-26 DIAGNOSIS — J449 Chronic obstructive pulmonary disease, unspecified: Secondary | ICD-10-CM | POA: Insufficient documentation

## 2015-04-26 DIAGNOSIS — M349 Systemic sclerosis, unspecified: Secondary | ICD-10-CM | POA: Insufficient documentation

## 2015-04-26 DIAGNOSIS — Z9221 Personal history of antineoplastic chemotherapy: Secondary | ICD-10-CM | POA: Insufficient documentation

## 2015-04-26 DIAGNOSIS — N189 Chronic kidney disease, unspecified: Secondary | ICD-10-CM | POA: Insufficient documentation

## 2015-04-26 DIAGNOSIS — R609 Edema, unspecified: Secondary | ICD-10-CM | POA: Insufficient documentation

## 2015-04-26 DIAGNOSIS — E119 Type 2 diabetes mellitus without complications: Secondary | ICD-10-CM | POA: Insufficient documentation

## 2015-04-26 DIAGNOSIS — K219 Gastro-esophageal reflux disease without esophagitis: Secondary | ICD-10-CM | POA: Insufficient documentation

## 2015-04-26 DIAGNOSIS — I5022 Chronic systolic (congestive) heart failure: Secondary | ICD-10-CM | POA: Diagnosis not present

## 2015-04-26 DIAGNOSIS — I10 Essential (primary) hypertension: Secondary | ICD-10-CM | POA: Insufficient documentation

## 2015-04-26 DIAGNOSIS — Z79899 Other long term (current) drug therapy: Secondary | ICD-10-CM | POA: Diagnosis not present

## 2015-04-26 DIAGNOSIS — Z923 Personal history of irradiation: Secondary | ICD-10-CM | POA: Insufficient documentation

## 2015-04-26 DIAGNOSIS — C52 Malignant neoplasm of vagina: Secondary | ICD-10-CM | POA: Insufficient documentation

## 2015-04-26 DIAGNOSIS — Z7982 Long term (current) use of aspirin: Secondary | ICD-10-CM | POA: Insufficient documentation

## 2015-04-26 NOTE — Progress Notes (Signed)
Gynecologic Oncology Consult Visit   Referring Provider: Jacquelyne Balint, MD   Chief Concern: History of Stage II vaginal cancer  Subjective:  Cheryl Hamilton is a 64 y.o. female who is seen in consultation from Dr. Sabra Heck for Stage II vaginal cancer.  She was last seen by Dr. Christel Mormon on 06/15/2014. 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 She also has lymphedema secondary to surgery and radiation. She complains of leg swelling today and it is difficult to discern if this is secondary to her treatment or cardiac issues. She states she was told that the chemotherapy effected her heart, but this would be unusual for cisplatin.   She missed her last appointment with [place] as she was hospitalized at [facility] after sustained a significant fall.    Oncology History: Cheryl Hamilton is a very pleasant patient who was initially seen in consultation from Dr. Sabra Heck for vaginal cancer. 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 She had a colposcopy and wide local excision, which revealed extensive VIN III, with one small area (6 mm) of invasive cancer (2.3 mm depth). At a repeat exam, dysplastic changes were found in the vaginal vault. She underwent wide local excision of the vulva and bilateral inguinal lymphadenectomy 07/13/2013 Final pathology in the vagina was positive for malignancy. The greatest depth of invasion was 0.4 cm. 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 She had a PET on 08/17/13, which demonstrated minimal uptake in the perineum, as well as prominent uptake in the colon. She was seen by Dr. Donella Stade at [facility], who recommended external beam radiation and chemotherapy. She was referred here, as they had concerns about treating her with radiation, given her scleroderma, and she was concerned about the rate of growth of these vaginal lesions.   She was presented the options for treatment include  exenteration, which would likely require at least a posterior exenteration, but may also also require an anterior exenteration. Given her significant medical comorbidities, she would be at very high surgical risk. She could also be a candidate for radiation with sensitizing chemotherapy with cisplatin with curative intent. She is also at increased risk with radiation, because of her scleroderma, but the risk of surgery is likely higher than the risk of radiation.  After a thorough discussion regarding the pros and cons of surgery vs chemoradiation we recommend concurrent chemotherapy and radiation. She completed 36 Gy in 1.8 Gy fractions to the pelvis, and total dose of 64.8 Gy to the vaginal primary with concurrent cisplatinum therapy on 11/08/2013. She has been NED since.    Problem List: Patient Active Problem List   Diagnosis Date Noted  . Vaginal cancer 04/26/2015  . Raynaud disease 12/28/2014  . Chronic systolic heart failure 94/49/6759    Past Medical History: Past Medical History  Diagnosis Date  . CHF (congestive heart failure)   . Hypertension   . Coronary artery disease   . Renal insufficiency   . Diabetes mellitus, type 2   . Scleroderma   . Chronic back pain   . GERD (gastroesophageal reflux disease)   . Blue skin   . COPD (chronic obstructive pulmonary disease)   . Raynaud disease   . Shortness of breath dyspnea   . Anginal pain   . Edema   . Hyperlipemia   . Ovarian cancer     chemo/rad  . Vaginal cancer   . Vulvar cancer  Past Surgical History: Past Surgical History  Procedure Laterality Date  . Back surgery  1990  . Vulva surgery    . Nasal sinus surgery    . Cervical fusion    . Abdominal hysterectomy    . Cardiac catheterization  12/23/2013  . Coronary angioplasty with stent placement  12/23/2013  . Cardiac catheterization N/A 01/10/2015    Procedure: Left Heart Cath and Coronary Angiography;  Surgeon: Dionisio David, MD;  Location: [place] [facility];  Service: Cardiovascular;  Laterality: N/A;  . Breast biopsy Left 01/15/12    neg  . Breast biopsy Left     neg bx/clip  . Breast biopsy Right     neg-bx/clip    Past Gynecologic History:  See interval history  OB History:  G2P2  Family History:  Family History positive   Comments breast cancer: daughter,   Social History: Social History   Social History  . Marital Status: Single    Spouse Name: N/A  . Number of Children: N/A  . Years of Education: N/A   Occupational History  . Not on file.   Social History Main Topics  . Smoking status: Former Smoker    Types: Cigarettes    Quit date: 07/12/2013  . Smokeless tobacco: Not on file  . Alcohol Use: No  . Drug Use: No  . Sexual Activity: Not on file   Other Topics Concern  . Not on file   Social History Narrative    Allergies: No Known Allergies  Current Medications: Current Outpatient Prescriptions  Medication Sig Dispense Refill  . acetaminophen (TYLENOL) 325 MG tablet Take 325 mg by mouth every 4 (four) hours as needed.    Marland Kitchen albuterol (PROVENTIL HFA;VENTOLIN HFA) 108 (90 BASE) MCG/ACT inhaler Inhale 2 puffs into the lungs every 6 (six) hours as needed for wheezing or shortness of breath.    Marland Kitchen aspirin EC 81 MG tablet Take 81 mg by mouth daily.    . bisacodyl (DULCOLAX) 10 MG suppository Place 10 mg rectally daily as needed for moderate constipation.    . carvedilol (COREG) 25 MG tablet Take 25 mg by mouth 2 (two) times daily with a meal.    . cetirizine (ZYRTEC) 5 MG tablet Take 5 mg by mouth daily.    . clopidogrel (PLAVIX) 75 MG tablet Take 75 mg by mouth daily.    . ergocalciferol (VITAMIN D2) 50000 UNITS capsule Take 50,000 Units by mouth once a week.    . fenofibrate (TRICOR) 145 MG tablet Take 145 mg by mouth daily.    . furosemide (LASIX) 20 MG tablet Take 20 mg by mouth daily.    . hydroxychloroquine (PLAQUENIL) 200 MG tablet Take 200 mg by mouth 2 (two) times daily.    . ivabradine (CORLANOR)  5 MG TABS tablet Take 5 mg by mouth 2 (two) times daily with a meal.    . lidocaine (XYLOCAINE) 5 % ointment Apply 1 application topically 3 (three) times daily as needed.    . Melatonin 3 MG TABS Take 1 tablet by mouth at bedtime.    Marland Kitchen nystatin-triamcinolone (MYCOLOG II) cream Apply 1 application topically 3 (three) times daily.    . pantoprazole (PROTONIX) 40 MG tablet Take 40 mg by mouth 2 (two) times daily.     . simvastatin (ZOCOR) 40 MG tablet Take 40 mg by mouth daily.    . sodium chloride (OCEAN) 0.65 % SOLN nasal spray Place 2 sprays into both nostrils 4 (four) times  daily.    . spironolactone (ALDACTONE) 25 MG tablet Take 25 mg by mouth daily.    Marland Kitchen triamcinolone (NASACORT) 55 MCG/ACT AERO nasal inhaler Place 2 sprays into the nose daily.     No current facility-administered medications for this visit.    Review of Systems General: Fatigue, weakness  HEENT: no complaints  Lungs: no complaints  Cardiac: no complaints  GI: constipation  GU: pain with urination that is chronic after radiation therapy and may be due to radiation cystitis; vulvar irritation sometimes  Musculoskeletal: no complaints  Extremities: leg swelling right > left and mons swelling  Skin: no complaints  Neuro: no complaints  Endocrine: no complaints  Psych: no complaints       Objective:  Physical Examination:  BP 135/77 mmHg  Pulse 74  Temp(Src) 98.1 F (36.7 C) (Tympanic)  Resp 18  Ht 5' (1.524 m)  Wt 131 lb 4.5 oz (59.55 kg)  BMI 25.64 kg/m2   ECOG Performance Status: 1 - Symptomatic but completely ambulatory  General appearance: alert and cooperative; appears stated age; skin color bluish/green HEENT:PERRLA and sclera clear, anicteric Lymph node survey: non-palpable, inguinal Cardiovascular: regular rate and rhythm Respiratory: decreased breath sound;  crackles: diffuse Abdomen: soft, non-tender, without masses or organomegaly, no hernias and well healed incision Extremities:  nonsymmetrical. Increased swelling of right > left leg.  Neurological exam reveals alert, oriented, normal speech, no focal findings or movement disorder noted.  Pelvic: exam chaperoned by nurse; Mons with lymphedema Vulva: normal appearing vulva with no masses, tenderness or lesions; Vagina: vagina positive for atrophic mucosa and stenosis , no gross lesions, narrowed caliber; Adnexa: no masses; Uterus/Cervix: surgically absent, vaginal cuff well healed;    Lab Review Labs on site today: None  Radiologic Imaging: None    Assessment:  Tonyetta Berko is a 64 y.o. female diagnosed with Stage II vaginal cancer, NED. Lymphedema involving the mons and lower extremities. Radiation cystitis, possible, with stable symptoms.   Plan:   Problem List Items Addressed This Visit      Genitourinary   Vaginal cancer - Primary      Ms. Ratchford will follow up with Dr. Christel Mormon in 3 months at [facility] and then return to our clinic in 6 months for continued follow up. I asked her to follow up with her PCP and cardiologist regarding her leg - is this lymphedema or could there be a component of cardiac disease that is contributing to the swelling.    Gillis Ends, MD    CC:  Sofie Hartigan, MD [address], [place] [address] [phone]

## 2015-04-27 ENCOUNTER — Encounter (INDEPENDENT_AMBULATORY_CARE_PROVIDER_SITE_OTHER): Payer: Self-pay

## 2015-04-27 ENCOUNTER — Ambulatory Visit: Payer: 59

## 2015-04-27 ENCOUNTER — Ambulatory Visit
Admission: RE | Admit: 2015-04-27 | Discharge: 2015-04-27 | Disposition: A | Discharge status: Home / Self Care | Payer: 59 | Source: Ambulatory Visit | Attending: Family Medicine | Admitting: Family Medicine

## 2015-04-27 DIAGNOSIS — N6489 Other specified disorders of breast: Secondary | ICD-10-CM | POA: Diagnosis not present

## 2015-04-27 DIAGNOSIS — R928 Other abnormal and inconclusive findings on diagnostic imaging of breast: Secondary | ICD-10-CM

## 2015-05-01 ENCOUNTER — Other Ambulatory Visit: Payer: Self-pay | Admitting: Orthopedic Surgery

## 2015-05-01 DIAGNOSIS — M5442 Lumbago with sciatica, left side: Principal | ICD-10-CM

## 2015-05-01 DIAGNOSIS — M48061 Spinal stenosis, lumbar region without neurogenic claudication: Secondary | ICD-10-CM

## 2015-05-01 DIAGNOSIS — M5441 Lumbago with sciatica, right side: Secondary | ICD-10-CM

## 2015-05-04 ENCOUNTER — Telehealth: Payer: Self-pay | Admitting: *Deleted

## 2015-05-04 NOTE — Telephone Encounter (Signed)
-----   Message from Wren, MD sent at 05/02/2015  1:45 PM EDT ----- Hello,  Can you please reach out to Ms. Kil. She needs a small or extra-small vaginal dilator and teaching. If she is unable to use the dilator on her own she will need a PT pelvic floor consult.  Thank you Sincerely [place]

## 2015-05-04 NOTE — Telephone Encounter (Signed)
Discussed md recommendations for the use of vaginal dilator. Patient expresses concerns about affordability of the dilators. She inquires how the cancer center can assist in this area. RN will obtain information to see how patient can receive dilator with minimal cost and contact patient back. Patient given several options on how to order a vaginal dilator from various companies; however patient would like cancer center to see if one could be obtained for her.  I spoke with Maudie Mercury, RN in our radiation department, who will research this as well for the patient.  Patient states that she has an RX for a walker; however, she is struggling to find a medical supply company that has a seated walker for less than $60. I offered assistance in this area, but patient states that she will let me know if further help is needed to find her a walker. She admits to falling multiple times over the last 6 months. She fell down last week and hit her face on the dressor in the bedroom and still has a "black eye on both eyes."  Discussed the following patient education information with the patient: How to Insert a Vaginal Dilator  1. Apply lubricant to the dilator and the opening of your vagina or used warm water on the vaginal dilator prior to insertion to make it easier to insert 2. Using gentle pressure, slowly insert the round end of the dilator into your vagina using gentle pressure 3. Continue to insert the dilator until you feel slight discomfort or muscle tension and then stop. 4. Discussed with patient to set a goal to use the dilators for 5-10 minutes each session at least 2 to 3 times a week. 5. Asked patient to clean the dilator with soap/water after every use. 6. Discussed on ordering a vaginal dilator. Patient needs to start with an extra small size dilator as her vagina is very small rather than using a large dilator. Discussed the purpose of the dilator to improve sexual dysfunction and to keep the the vagina  opening. 7. Offered PT pelvic floor consult. Patient states that she would like to start with the vaginal dilator first before attending the pelvic PT referral due to finances.

## 2015-05-05 ENCOUNTER — Ambulatory Visit
Admission: RE | Admit: 2015-05-05 | Discharge: 2015-05-05 | Disposition: A | Discharge status: Home / Self Care | Payer: 59 | Source: Ambulatory Visit | Attending: Orthopedic Surgery | Admitting: Orthopedic Surgery

## 2015-05-05 DIAGNOSIS — M5441 Lumbago with sciatica, right side: Secondary | ICD-10-CM | POA: Insufficient documentation

## 2015-05-05 DIAGNOSIS — M5442 Lumbago with sciatica, left side: Secondary | ICD-10-CM | POA: Insufficient documentation

## 2015-05-05 DIAGNOSIS — M4806 Spinal stenosis, lumbar region: Secondary | ICD-10-CM | POA: Diagnosis not present

## 2015-05-05 DIAGNOSIS — M48061 Spinal stenosis, lumbar region without neurogenic claudication: Secondary | ICD-10-CM

## 2015-06-11 ENCOUNTER — Emergency Department: Payer: 59

## 2015-06-11 ENCOUNTER — Inpatient Hospital Stay (HOSPITAL_COMMUNITY)
Admission: AD | Admit: 2015-06-11 | Discharge: 2015-06-15 | DRG: 085 | Disposition: A | Discharge status: Skilled Nursing Facility | Payer: 59 | Source: Other Acute Inpatient Hospital | Attending: Internal Medicine | Admitting: Internal Medicine

## 2015-06-11 ENCOUNTER — Emergency Department
Admission: EM | Admit: 2015-06-11 | Discharge: 2015-06-11 | Disposition: A | Discharge status: Other Acute Inpatient Hospital | Payer: 59 | Attending: Emergency Medicine | Admitting: Emergency Medicine

## 2015-06-11 DIAGNOSIS — Y998 Other external cause status: Secondary | ICD-10-CM | POA: Insufficient documentation

## 2015-06-11 DIAGNOSIS — Y92009 Unspecified place in unspecified non-institutional (private) residence as the place of occurrence of the external cause: Secondary | ICD-10-CM | POA: Insufficient documentation

## 2015-06-11 DIAGNOSIS — I629 Nontraumatic intracranial hemorrhage, unspecified: Secondary | ICD-10-CM | POA: Diagnosis not present

## 2015-06-11 DIAGNOSIS — S066X0A Traumatic subarachnoid hemorrhage without loss of consciousness, initial encounter: Secondary | ICD-10-CM | POA: Insufficient documentation

## 2015-06-11 DIAGNOSIS — J449 Chronic obstructive pulmonary disease, unspecified: Secondary | ICD-10-CM | POA: Diagnosis present

## 2015-06-11 DIAGNOSIS — I5022 Chronic systolic (congestive) heart failure: Secondary | ICD-10-CM | POA: Diagnosis not present

## 2015-06-11 DIAGNOSIS — Z972 Presence of dental prosthetic device (complete) (partial): Secondary | ICD-10-CM | POA: Insufficient documentation

## 2015-06-11 DIAGNOSIS — Z7982 Long term (current) use of aspirin: Secondary | ICD-10-CM | POA: Insufficient documentation

## 2015-06-11 DIAGNOSIS — S8001XA Contusion of right knee, initial encounter: Secondary | ICD-10-CM | POA: Insufficient documentation

## 2015-06-11 DIAGNOSIS — I130 Hypertensive heart and chronic kidney disease with heart failure and stage 1 through stage 4 chronic kidney disease, or unspecified chronic kidney disease: Secondary | ICD-10-CM | POA: Diagnosis present

## 2015-06-11 DIAGNOSIS — S80212A Abrasion, left knee, initial encounter: Secondary | ICD-10-CM | POA: Diagnosis not present

## 2015-06-11 DIAGNOSIS — S12600A Unspecified displaced fracture of seventh cervical vertebra, initial encounter for closed fracture: Secondary | ICD-10-CM | POA: Diagnosis present

## 2015-06-11 DIAGNOSIS — Z87891 Personal history of nicotine dependence: Secondary | ICD-10-CM

## 2015-06-11 DIAGNOSIS — E785 Hyperlipidemia, unspecified: Secondary | ICD-10-CM | POA: Diagnosis present

## 2015-06-11 DIAGNOSIS — E860 Dehydration: Secondary | ICD-10-CM | POA: Diagnosis present

## 2015-06-11 DIAGNOSIS — S12601A Unspecified nondisplaced fracture of seventh cervical vertebra, initial encounter for closed fracture: Secondary | ICD-10-CM | POA: Diagnosis present

## 2015-06-11 DIAGNOSIS — Z9181 History of falling: Secondary | ICD-10-CM

## 2015-06-11 DIAGNOSIS — Z955 Presence of coronary angioplasty implant and graft: Secondary | ICD-10-CM

## 2015-06-11 DIAGNOSIS — T502X5A Adverse effect of carbonic-anhydrase inhibitors, benzothiadiazides and other diuretics, initial encounter: Secondary | ICD-10-CM | POA: Diagnosis present

## 2015-06-11 DIAGNOSIS — R110 Nausea: Secondary | ICD-10-CM

## 2015-06-11 DIAGNOSIS — R296 Repeated falls: Secondary | ICD-10-CM | POA: Diagnosis present

## 2015-06-11 DIAGNOSIS — G9340 Encephalopathy, unspecified: Secondary | ICD-10-CM | POA: Diagnosis present

## 2015-06-11 DIAGNOSIS — K219 Gastro-esophageal reflux disease without esophagitis: Secondary | ICD-10-CM | POA: Diagnosis present

## 2015-06-11 DIAGNOSIS — C52 Malignant neoplasm of vagina: Secondary | ICD-10-CM | POA: Diagnosis present

## 2015-06-11 DIAGNOSIS — E876 Hypokalemia: Secondary | ICD-10-CM | POA: Diagnosis present

## 2015-06-11 DIAGNOSIS — S2242XA Multiple fractures of ribs, left side, initial encounter for closed fracture: Secondary | ICD-10-CM | POA: Diagnosis present

## 2015-06-11 DIAGNOSIS — S42034A Nondisplaced fracture of lateral end of right clavicle, initial encounter for closed fracture: Secondary | ICD-10-CM | POA: Diagnosis present

## 2015-06-11 DIAGNOSIS — S8002XA Contusion of left knee, initial encounter: Secondary | ICD-10-CM | POA: Diagnosis not present

## 2015-06-11 DIAGNOSIS — I2510 Atherosclerotic heart disease of native coronary artery without angina pectoris: Secondary | ICD-10-CM

## 2015-06-11 DIAGNOSIS — I7300 Raynaud's syndrome without gangrene: Secondary | ICD-10-CM | POA: Diagnosis present

## 2015-06-11 DIAGNOSIS — M549 Dorsalgia, unspecified: Secondary | ICD-10-CM | POA: Diagnosis present

## 2015-06-11 DIAGNOSIS — I10 Essential (primary) hypertension: Secondary | ICD-10-CM | POA: Diagnosis not present

## 2015-06-11 DIAGNOSIS — R42 Dizziness and giddiness: Secondary | ICD-10-CM | POA: Diagnosis present

## 2015-06-11 DIAGNOSIS — Z8544 Personal history of malignant neoplasm of other female genital organs: Secondary | ICD-10-CM | POA: Diagnosis not present

## 2015-06-11 DIAGNOSIS — D649 Anemia, unspecified: Secondary | ICD-10-CM | POA: Diagnosis present

## 2015-06-11 DIAGNOSIS — I272 Other secondary pulmonary hypertension: Secondary | ICD-10-CM | POA: Diagnosis present

## 2015-06-11 DIAGNOSIS — M349 Systemic sclerosis, unspecified: Secondary | ICD-10-CM | POA: Diagnosis present

## 2015-06-11 DIAGNOSIS — E1122 Type 2 diabetes mellitus with diabetic chronic kidney disease: Secondary | ICD-10-CM | POA: Diagnosis present

## 2015-06-11 DIAGNOSIS — W1839XA Other fall on same level, initial encounter: Secondary | ICD-10-CM | POA: Diagnosis present

## 2015-06-11 DIAGNOSIS — Z8249 Family history of ischemic heart disease and other diseases of the circulatory system: Secondary | ICD-10-CM

## 2015-06-11 DIAGNOSIS — Z8543 Personal history of malignant neoplasm of ovary: Secondary | ICD-10-CM

## 2015-06-11 DIAGNOSIS — Z7902 Long term (current) use of antithrombotics/antiplatelets: Secondary | ICD-10-CM | POA: Insufficient documentation

## 2015-06-11 DIAGNOSIS — S129XXA Fracture of neck, unspecified, initial encounter: Secondary | ICD-10-CM

## 2015-06-11 DIAGNOSIS — S12690A Other displaced fracture of seventh cervical vertebra, initial encounter for closed fracture: Secondary | ICD-10-CM | POA: Diagnosis not present

## 2015-06-11 DIAGNOSIS — R0902 Hypoxemia: Secondary | ICD-10-CM

## 2015-06-11 DIAGNOSIS — R710 Precipitous drop in hematocrit: Secondary | ICD-10-CM | POA: Diagnosis present

## 2015-06-11 DIAGNOSIS — D696 Thrombocytopenia, unspecified: Secondary | ICD-10-CM | POA: Diagnosis present

## 2015-06-11 DIAGNOSIS — N189 Chronic kidney disease, unspecified: Secondary | ICD-10-CM | POA: Diagnosis present

## 2015-06-11 DIAGNOSIS — E119 Type 2 diabetes mellitus without complications: Secondary | ICD-10-CM | POA: Diagnosis not present

## 2015-06-11 DIAGNOSIS — I609 Nontraumatic subarachnoid hemorrhage, unspecified: Secondary | ICD-10-CM

## 2015-06-11 DIAGNOSIS — S0990XA Unspecified injury of head, initial encounter: Secondary | ICD-10-CM

## 2015-06-11 DIAGNOSIS — G8929 Other chronic pain: Secondary | ICD-10-CM | POA: Diagnosis present

## 2015-06-11 DIAGNOSIS — N179 Acute kidney failure, unspecified: Secondary | ICD-10-CM

## 2015-06-11 DIAGNOSIS — N390 Urinary tract infection, site not specified: Secondary | ICD-10-CM | POA: Diagnosis not present

## 2015-06-11 DIAGNOSIS — S12691S Other nondisplaced fracture of seventh cervical vertebra, sequela: Secondary | ICD-10-CM | POA: Diagnosis not present

## 2015-06-11 DIAGNOSIS — I951 Orthostatic hypotension: Secondary | ICD-10-CM | POA: Diagnosis present

## 2015-06-11 DIAGNOSIS — Y9389 Activity, other specified: Secondary | ICD-10-CM | POA: Diagnosis not present

## 2015-06-11 DIAGNOSIS — Z79899 Other long term (current) drug therapy: Secondary | ICD-10-CM | POA: Diagnosis not present

## 2015-06-11 DIAGNOSIS — R609 Edema, unspecified: Secondary | ICD-10-CM

## 2015-06-11 DIAGNOSIS — S066X0S Traumatic subarachnoid hemorrhage without loss of consciousness, sequela: Secondary | ICD-10-CM | POA: Diagnosis not present

## 2015-06-11 DIAGNOSIS — D509 Iron deficiency anemia, unspecified: Secondary | ICD-10-CM | POA: Diagnosis present

## 2015-06-11 DIAGNOSIS — I509 Heart failure, unspecified: Secondary | ICD-10-CM | POA: Diagnosis not present

## 2015-06-11 LAB — URINALYSIS COMPLETE WITH MICROSCOPIC (ARMC ONLY)
BILIRUBIN URINE: NEGATIVE
GLUCOSE, UA: NEGATIVE mg/dL
Hgb urine dipstick: NEGATIVE
KETONES UR: NEGATIVE mg/dL
Nitrite: NEGATIVE
PROTEIN: NEGATIVE mg/dL
Specific Gravity, Urine: 1.015 (ref 1.005–1.030)
pH: 5 (ref 5.0–8.0)

## 2015-06-11 LAB — CBC
HCT: 31.9 % — ABNORMAL LOW (ref 35.0–47.0)
Hemoglobin: 10.1 g/dL — ABNORMAL LOW (ref 12.0–16.0)
MCH: 24.3 pg — AB (ref 26.0–34.0)
MCHC: 31.7 g/dL — AB (ref 32.0–36.0)
MCV: 76.6 fL — ABNORMAL LOW (ref 80.0–100.0)
PLATELETS: 215 10*3/uL (ref 150–440)
RBC: 4.16 MIL/uL (ref 3.80–5.20)
RDW: 22.1 % — AB (ref 11.5–14.5)
WBC: 12.3 10*3/uL — ABNORMAL HIGH (ref 3.6–11.0)

## 2015-06-11 LAB — BASIC METABOLIC PANEL
Anion gap: 12 (ref 5–15)
BUN: 71 mg/dL — ABNORMAL HIGH (ref 6–20)
CALCIUM: 8.1 mg/dL — AB (ref 8.9–10.3)
CO2: 24 mmol/L (ref 22–32)
CREATININE: 3.05 mg/dL — AB (ref 0.44–1.00)
Chloride: 103 mmol/L (ref 101–111)
GFR calc Af Amer: 18 mL/min — ABNORMAL LOW (ref >60)
GFR calc non Af Amer: 15 mL/min — ABNORMAL LOW (ref >60)
GLUCOSE: 113 mg/dL — AB (ref 65–99)
Potassium: 3.5 mmol/L (ref 3.5–5.1)
Sodium: 139 mmol/L (ref 135–145)

## 2015-06-11 MED ORDER — ONDANSETRON HCL 4 MG/2ML IJ SOLN
4.0000 mg | Freq: Once | INTRAMUSCULAR | Status: AC
  Administered 2015-06-11: 4 mg via INTRAVENOUS

## 2015-06-11 MED ORDER — SODIUM CHLORIDE 0.9 % IV BOLUS (SEPSIS)
1000.0000 mL | Freq: Once | INTRAVENOUS | Status: AC
  Administered 2015-06-11: 1000 mL via INTRAVENOUS

## 2015-06-11 MED ORDER — SODIUM CHLORIDE 0.9 % IV SOLN
Freq: Once | INTRAVENOUS | Status: AC
  Administered 2015-06-11: 21:00:00 via INTRAVENOUS

## 2015-06-11 MED ORDER — CEFTRIAXONE SODIUM 1 G IJ SOLR
1.0000 g | Freq: Once | INTRAMUSCULAR | Status: AC
  Administered 2015-06-11: 1 g via INTRAVENOUS
  Filled 2015-06-11: qty 10

## 2015-06-11 MED ORDER — ONDANSETRON HCL 4 MG/2ML IJ SOLN
INTRAMUSCULAR | Status: AC
  Filled 2015-06-11: qty 2

## 2015-06-11 MED ORDER — MORPHINE SULFATE (PF) 4 MG/ML IV SOLN
4.0000 mg | Freq: Once | INTRAVENOUS | Status: AC
  Administered 2015-06-11: 4 mg via INTRAVENOUS

## 2015-06-11 MED ORDER — MORPHINE SULFATE (PF) 4 MG/ML IV SOLN
INTRAVENOUS | Status: AC
  Filled 2015-06-11: qty 1

## 2015-06-11 NOTE — ED Provider Notes (Signed)
[facility] Emergency Department Provider Note   ____________________________________________  Time seen: 6:30 PM I have reviewed the triage vital signs and the triage nursing note.  HISTORY  Chief Complaint Dizziness   Historian Patient  HPI Cheryl Hamilton is a 64 y.o. female who lives at home with a disabled son, who is here for several falls today. She states that she landed on her right knee and her back and she's having some pain to her right knee and her back is not painful. She is unsure whether or not she hit her head. There is some report that she has had several falls over the past month and may have hit her head then.  Patient reports dry mouth. No nausea, vomiting, or diarrhea. No chest pain or abdominal pain. No shortness of coughing.    Past Medical History  Diagnosis Date  . CHF (congestive heart failure)   . Hypertension   . Coronary artery disease   . Renal insufficiency   . Diabetes mellitus, type 2   . Scleroderma   . Chronic back pain   . GERD (gastroesophageal reflux disease)   . Blue skin   . COPD (chronic obstructive pulmonary disease)   . Raynaud disease   . Shortness of breath dyspnea   . Anginal pain   . Edema   . Hyperlipemia   . Ovarian cancer     chemo/rad  . Vaginal cancer   . Vulvar cancer     Patient Active Problem List   Diagnosis Date Noted  . Vaginal cancer ([place]) 04/26/2015  . Raynaud disease 12/28/2014  . Chronic systolic heart failure ([place]) 12/28/2014    Past Surgical History  Procedure Laterality Date  . Back surgery  1990  . Vulva surgery    . Nasal sinus surgery    . Cervical fusion    . Abdominal hysterectomy    . Cardiac catheterization  12/23/2013  . Coronary angioplasty with stent placement  12/23/2013  . Cardiac catheterization N/A 01/10/2015    Procedure: Left Heart Cath and Coronary Angiography;  Surgeon: Dionisio David, MD;  Location: [place] [facility];  Service: Cardiovascular;   Laterality: N/A;  . Breast biopsy Left 01/15/12    neg  . Breast biopsy Left     neg bx/clip  . Breast biopsy Right     neg-bx/clip    Current Outpatient Rx  Name  Route  Sig  Dispense  Refill  . acetaminophen (TYLENOL) 325 MG tablet   Oral   Take 325 mg by mouth every 4 (four) hours as needed.         Marland Kitchen albuterol (PROVENTIL HFA;VENTOLIN HFA) 108 (90 BASE) MCG/ACT inhaler   Inhalation   Inhale 2 puffs into the lungs every 6 (six) hours as needed for wheezing or shortness of breath.         Marland Kitchen aspirin EC 81 MG tablet   Oral   Take 81 mg by mouth daily.         . bisacodyl (DULCOLAX) 10 MG suppository   Rectal   Place 10 mg rectally daily as needed for moderate constipation.         . carvedilol (COREG) 25 MG tablet   Oral   Take 25 mg by mouth 2 (two) times daily with a meal.         . cetirizine (ZYRTEC) 5 MG tablet   Oral   Take 5 mg by mouth daily.         Marland Kitchen  clopidogrel (PLAVIX) 75 MG tablet   Oral   Take 75 mg by mouth daily.         . ergocalciferol (VITAMIN D2) 50000 UNITS capsule   Oral   Take 50,000 Units by mouth once a week.         . fenofibrate (TRICOR) 145 MG tablet   Oral   Take 145 mg by mouth daily.         . furosemide (LASIX) 20 MG tablet   Oral   Take 20 mg by mouth daily.         . hydroxychloroquine (PLAQUENIL) 200 MG tablet   Oral   Take 200 mg by mouth 2 (two) times daily.         . ivabradine (CORLANOR) 5 MG TABS tablet   Oral   Take 5 mg by mouth 2 (two) times daily with a meal.         . lidocaine (XYLOCAINE) 5 % ointment   Topical   Apply 1 application topically 3 (three) times daily as needed.         . Melatonin 3 MG TABS   Oral   Take 1 tablet by mouth at bedtime.         Marland Kitchen nystatin-triamcinolone (MYCOLOG II) cream   Topical   Apply 1 application topically 3 (three) times daily.         . pantoprazole (PROTONIX) 40 MG tablet   Oral   Take 40 mg by mouth 2 (two) times daily.          .  simvastatin (ZOCOR) 40 MG tablet   Oral   Take 40 mg by mouth daily.         . sodium chloride (OCEAN) 0.65 % SOLN nasal spray   Each Nare   Place 2 sprays into both nostrils 4 (four) times daily.         Marland Kitchen spironolactone (ALDACTONE) 25 MG tablet   Oral   Take 25 mg by mouth daily.         Marland Kitchen triamcinolone (NASACORT) 55 MCG/ACT AERO nasal inhaler   Nasal   Place 2 sprays into the nose daily.           Allergies Review of patient's allergies indicates no known allergies.  No family history on file.  Social History Social History  Substance Use Topics  . Smoking status: Former Smoker    Types: Cigarettes    Quit date: 07/12/2013  . Smokeless tobacco: Not on file  . Alcohol Use: No    Review of Systems  Constitutional: Negative for fever. Eyes: Negative for visual changes. ENT: Negative for sore throat. Cardiovascular: Negative for chest pain. Respiratory: Negative for shortness of breath. Gastrointestinal: Negative for abdominal pain, vomiting and diarrhea. Genitourinary: Negative for dysuria. Musculoskeletal: Negative for back pain. Skin: Multiple areas of bruising. Neurological: Positive for mild general headache. 10 point Review of Systems otherwise negative ____________________________________________   PHYSICAL EXAM:  VITAL SIGNS: ED Triage Vitals  Enc Vitals Group     BP 06/11/15 1752 107/90 mmHg     Pulse Rate 06/11/15 1752 83     Resp --      Temp 06/11/15 1752 98.2 F (36.8 C)     Temp Source 06/11/15 1752 Oral     SpO2 06/11/15 1752 100 %     Weight 06/11/15 1752 123 lb (55.792 kg)     Height 06/11/15 1752 5' (1.524 m)     Head  Cir --      Peak Flow --      Pain Score 06/11/15 1753 10     Pain Loc --      Pain Edu? --      Excl. in [place]? --      Constitutional: Alert and somewhat confused. Cooperative.. Well appearing and in no distress. Eyes: Conjunctivae are normal. PERRL. Normal extraocular movements. ENT   Head:  Normocephalic and atraumatic.   Nose: No congestion/rhinnorhea.   Mouth/Throat: Mucous membranes are dry   Neck: No stridor. Cardiovascular/Chest: Normal rate, regular rhythm.  No murmurs, rubs, or gallops. Respiratory: Normal respiratory effort without tachypnea nor retractions. Breath sounds are clear and equal bilaterally. No wheezes/rales/rhonchi. Gastrointestinal: Soft. No distention, no guarding, no rebound. Nontender   Genitourinary/rectal:Deferred Musculoskeletal: Ecchymosis over both anterior knees. Minor abrasion left knee. Moderate tenderness to passive and active range of motion of the right knee. No laxity. Distal is stable. No hip pain. No upper extremity pain Neurologic:  Mildly slurring her speech, appears to be due to dentures with dry mouth. No gross or focal neurologic deficits are appreciated. 5 out of 5 strength upper extremity. Skin:  Skin is warm, dry. Gray color to skin. No rash noted.   ____________________________________________   EKG I, Lisa Roca, MD, the attending physician have personally viewed and interpreted all ECGs.  84 bpm. Incomplete left bundle branch block. Normal axis. Nonspecific T wave. ____________________________________________  LABS (pertinent positives/negatives)  Basic metabolic panel significant for BUN 21 and creatinine 3.05 CBC shows white blood cell count of 12.3, hemoglobin 10.1, platelet count 215 Urinalysis  2+ leukocytes, 6-30 white blood cells and rare bacteria. ____________________________________________  RADIOLOGY All Xrays were viewed by me. Imaging interpreted by Radiologist.  Head CT noncontrast:  IMPRESSION: Small LEFT superior frontal cortical contusion with trace adjacent subarachnoid blood. This is likely posttraumatic. There is no significant mass effect. No skull fracture is observed.  CT cervical spine non-contrast:  IMPRESSION: Findings consistent with a mild posttraumatic anterior  wedge superior endplate compression fracture of C7. No significant retropulsion or posterior element involvement. This injury is likely stable. No visible associated intraspinal hematoma. __________________________________________  PROCEDURES  Procedure(s) performed: None  Critical Care performed: None  ____________________________________________   ED COURSE / ASSESSMENT AND PLAN  CONSULTATIONS: Phone consultation with neurosurgeon on call, Zacarias Pontes -- leave patient in c collar and will be seen in consultation at [facility].  Phone consultation with admitting hospitalist Dr. Hal Hope who accepted in transfer patient.  Pertinent labs & imaging results that were available during my care of the patient were reviewed by me and considered in my medical decision making (see chart for details).   Patient found have acute renal failure suspected dehydration. Patient was started on normal saline bolus.  CT head was found to have a small cerebral contusion with associated subarachnoid hemorrhage. She was also found to have a right C7's. Endplate compression fracture. I discussed this case with the neurosurgeon on-call at [facility], who will see the patient consultation, and the hospitalist at [facility] will accept the patient in transfer.  Patient / Family / Caregiver informed of clinical course, medical decision-making process, and agree with plan.    ___________________________________________   FINAL CLINICAL IMPRESSION(S) / ED DIAGNOSES   Final diagnoses:  Acute renal failure, unspecified acute renal failure type ([place])  Subarachnoid hemorrhage ([place])  Cervical spine fracture, initial encounter  Urinary tract infection without hematuria, site unspecified       Lisa Roca,  MD 06/11/15 2045

## 2015-06-11 NOTE — ED Notes (Signed)
C-collar in place

## 2015-06-11 NOTE — ED Notes (Signed)
Pt arrives via ems from home, ems states that the patient has had multiple falls today and over the past month, pt was seen by her dr this past week for the same, states that today she has hit her head 3 times, pt has bruises in multiple stages of healing all over her body, pt has slurred speech and seems confused when answering some question, pt does state that she takes medication that makes her sleepy but states that she hasn't today. Pt has a blue grey coloration as a result of her medication to treat her scleraderma

## 2015-06-11 NOTE — Progress Notes (Signed)
Patient ID: Cheryl Hamilton, female   DOB: [birth year], 64 y.o.   MRN: [id number] Spoke with dr Reita Cliche. Ct head and cervical seen. No  Acute neuro emergency.  She  Has a cervical collar. To be transferred to [facility]. Will see her in am

## 2015-06-12 ENCOUNTER — Encounter (HOSPITAL_COMMUNITY): Payer: Self-pay | Admitting: Internal Medicine

## 2015-06-12 ENCOUNTER — Inpatient Hospital Stay (HOSPITAL_COMMUNITY): Payer: 59

## 2015-06-12 DIAGNOSIS — D649 Anemia, unspecified: Secondary | ICD-10-CM | POA: Diagnosis present

## 2015-06-12 DIAGNOSIS — G9340 Encephalopathy, unspecified: Secondary | ICD-10-CM

## 2015-06-12 DIAGNOSIS — N179 Acute kidney failure, unspecified: Secondary | ICD-10-CM | POA: Diagnosis present

## 2015-06-12 DIAGNOSIS — I629 Nontraumatic intracranial hemorrhage, unspecified: Secondary | ICD-10-CM

## 2015-06-12 DIAGNOSIS — I951 Orthostatic hypotension: Secondary | ICD-10-CM

## 2015-06-12 DIAGNOSIS — M349 Systemic sclerosis, unspecified: Secondary | ICD-10-CM | POA: Diagnosis present

## 2015-06-12 DIAGNOSIS — S12600A Unspecified displaced fracture of seventh cervical vertebra, initial encounter for closed fracture: Secondary | ICD-10-CM | POA: Diagnosis present

## 2015-06-12 DIAGNOSIS — I2510 Atherosclerotic heart disease of native coronary artery without angina pectoris: Secondary | ICD-10-CM

## 2015-06-12 LAB — BLOOD GAS, ARTERIAL
Acid-base deficit: 4.5 mmol/L — ABNORMAL HIGH (ref 0.0–2.0)
Acid-base deficit: 5.2 mmol/L — ABNORMAL HIGH (ref 0.0–2.0)
BICARBONATE: 19.1 meq/L — AB (ref 20.0–24.0)
Bicarbonate: 20.2 mEq/L (ref 20.0–24.0)
DRAWN BY: 406621
Drawn by: 437071
FIO2: 0.28
O2 CONTENT: 0.2 L/min
O2 CONTENT: 2 L/min
O2 Saturation: 81.8 %
O2 Saturation: 94.9 %
PCO2 ART: 33.9 mmHg — AB (ref 35.0–45.0)
PCO2 ART: 37.9 mmHg (ref 35.0–45.0)
PH ART: 7.345 — AB (ref 7.350–7.450)
PH ART: 7.37 (ref 7.350–7.450)
PO2 ART: 80.6 mmHg (ref 80.0–100.0)
Patient temperature: 98.1
Patient temperature: 98.6
TCO2: 20.2 mmol/L (ref 0–100)
TCO2: 21.4 mmol/L (ref 0–100)
pO2, Arterial: 50.1 mmHg — ABNORMAL LOW (ref 80.0–100.0)

## 2015-06-12 LAB — COMPREHENSIVE METABOLIC PANEL
ALBUMIN: 2.1 g/dL — AB (ref 3.5–5.0)
ALT: 28 U/L (ref 14–54)
AST: 34 U/L (ref 15–41)
Alkaline Phosphatase: 107 U/L (ref 38–126)
Anion gap: 10 (ref 5–15)
BILIRUBIN TOTAL: 0.8 mg/dL (ref 0.3–1.2)
BUN: 50 mg/dL — AB (ref 6–20)
CHLORIDE: 108 mmol/L (ref 101–111)
CO2: 21 mmol/L — ABNORMAL LOW (ref 22–32)
CREATININE: 2.04 mg/dL — AB (ref 0.44–1.00)
Calcium: 7.5 mg/dL — ABNORMAL LOW (ref 8.9–10.3)
GFR calc Af Amer: 29 mL/min — ABNORMAL LOW (ref >60)
GFR, EST NON AFRICAN AMERICAN: 25 mL/min — AB (ref >60)
GLUCOSE: 76 mg/dL (ref 65–99)
Potassium: 3 mmol/L — ABNORMAL LOW (ref 3.5–5.1)
Sodium: 139 mmol/L (ref 135–145)
TOTAL PROTEIN: 4.5 g/dL — AB (ref 6.5–8.1)

## 2015-06-12 LAB — CBC
HEMATOCRIT: 32.5 % — AB (ref 36.0–46.0)
Hemoglobin: 9.7 g/dL — ABNORMAL LOW (ref 12.0–15.0)
MCH: 24.4 pg — AB (ref 26.0–34.0)
MCHC: 29.8 g/dL — ABNORMAL LOW (ref 30.0–36.0)
MCV: 81.7 fL (ref 78.0–100.0)
Platelets: 173 10*3/uL (ref 150–400)
RBC: 3.98 MIL/uL (ref 3.87–5.11)
RDW: 21 % — AB (ref 11.5–15.5)
WBC: 13 10*3/uL — AB (ref 4.0–10.5)

## 2015-06-12 LAB — TROPONIN I
TROPONIN I: 0.05 ng/mL — AB (ref <0.031)
TROPONIN I: 0.05 ng/mL — AB (ref <0.031)
TROPONIN I: 0.05 ng/mL — AB (ref <0.031)

## 2015-06-12 LAB — CBC WITH DIFFERENTIAL/PLATELET
Basophils Absolute: 0 10*3/uL (ref 0.0–0.1)
Basophils Relative: 0 %
EOS ABS: 0.1 10*3/uL (ref 0.0–0.7)
EOS PCT: 2 %
HCT: 28.5 % — ABNORMAL LOW (ref 36.0–46.0)
Hemoglobin: 8.8 g/dL — ABNORMAL LOW (ref 12.0–15.0)
LYMPHS ABS: 0.9 10*3/uL (ref 0.7–4.0)
LYMPHS PCT: 11 %
MCH: 24.5 pg — AB (ref 26.0–34.0)
MCHC: 30.9 g/dL (ref 30.0–36.0)
MCV: 79.4 fL (ref 78.0–100.0)
MONO ABS: 0.6 10*3/uL (ref 0.1–1.0)
Monocytes Relative: 8 %
Neutro Abs: 6.5 10*3/uL (ref 1.7–7.7)
Neutrophils Relative %: 79 %
PLATELETS: 132 10*3/uL — AB (ref 150–400)
RBC: 3.59 MIL/uL — AB (ref 3.87–5.11)
RDW: 20.7 % — AB (ref 11.5–15.5)
WBC: 8.1 10*3/uL (ref 4.0–10.5)

## 2015-06-12 LAB — CREATININE, URINE, RANDOM: Creatinine, Urine: 59.88 mg/dL

## 2015-06-12 LAB — MAGNESIUM: Magnesium: 1.4 mg/dL — ABNORMAL LOW (ref 1.7–2.4)

## 2015-06-12 LAB — SODIUM, URINE, RANDOM: Sodium, Ur: 37 mmol/L

## 2015-06-12 LAB — AMMONIA: Ammonia: 45 umol/L — ABNORMAL HIGH (ref 9–35)

## 2015-06-12 MED ORDER — HYDROXYCHLOROQUINE SULFATE 200 MG PO TABS
200.0000 mg | ORAL_TABLET | Freq: Two times a day (BID) | ORAL | Status: DC
  Administered 2015-06-12 – 2015-06-14 (×5): 200 mg via ORAL
  Filled 2015-06-12 (×5): qty 1

## 2015-06-12 MED ORDER — POTASSIUM CHLORIDE CRYS ER 20 MEQ PO TBCR
40.0000 meq | EXTENDED_RELEASE_TABLET | ORAL | Status: AC
  Administered 2015-06-12 (×2): 40 meq via ORAL
  Filled 2015-06-12 (×2): qty 2

## 2015-06-12 MED ORDER — ACETAMINOPHEN 650 MG RE SUPP: 650.0000 mg | Freq: Four times a day (QID) | RECTAL | Status: DC | PRN

## 2015-06-12 MED ORDER — SODIUM CHLORIDE 0.9 % IV SOLN
INTRAVENOUS | Status: DC
  Administered 2015-06-12 (×2): 100 mL/h via INTRAVENOUS

## 2015-06-12 MED ORDER — IVABRADINE HCL 5 MG PO TABS
5.0000 mg | ORAL_TABLET | Freq: Two times a day (BID) | ORAL | Status: DC
  Administered 2015-06-12 – 2015-06-15 (×5): 5 mg via ORAL
  Filled 2015-06-12 (×10): qty 1

## 2015-06-12 MED ORDER — ALBUTEROL SULFATE (2.5 MG/3ML) 0.083% IN NEBU: 3.0000 mL | INHALATION_SOLUTION | Freq: Four times a day (QID) | RESPIRATORY_TRACT | Status: DC | PRN

## 2015-06-12 MED ORDER — PANTOPRAZOLE SODIUM 40 MG PO TBEC
40.0000 mg | DELAYED_RELEASE_TABLET | Freq: Two times a day (BID) | ORAL | Status: DC
  Administered 2015-06-12 – 2015-06-15 (×7): 40 mg via ORAL
  Filled 2015-06-12 (×7): qty 1

## 2015-06-12 MED ORDER — TRIAMCINOLONE ACETONIDE 55 MCG/ACT NA AERO
2.0000 | INHALATION_SPRAY | Freq: Every day | NASAL | Status: DC
  Administered 2015-06-12 – 2015-06-15 (×4): 2 via NASAL
  Filled 2015-06-12 (×2): qty 21.6

## 2015-06-12 MED ORDER — ACETAMINOPHEN 325 MG PO TABS
650.0000 mg | ORAL_TABLET | Freq: Four times a day (QID) | ORAL | Status: DC | PRN
  Administered 2015-06-12 – 2015-06-14 (×5): 650 mg via ORAL
  Filled 2015-06-12 (×5): qty 2

## 2015-06-12 MED ORDER — MINOCYCLINE HCL 100 MG PO CAPS
100.0000 mg | ORAL_CAPSULE | Freq: Two times a day (BID) | ORAL | Status: DC
  Administered 2015-06-12 – 2015-06-14 (×5): 100 mg via ORAL
  Filled 2015-06-12 (×6): qty 1

## 2015-06-12 MED ORDER — CYCLOBENZAPRINE HCL 10 MG PO TABS
5.0000 mg | ORAL_TABLET | Freq: Three times a day (TID) | ORAL | Status: DC | PRN
  Administered 2015-06-12 – 2015-06-14 (×3): 5 mg via ORAL
  Filled 2015-06-12 (×4): qty 1

## 2015-06-12 MED ORDER — ONDANSETRON HCL 4 MG PO TABS: 4.0000 mg | ORAL_TABLET | Freq: Four times a day (QID) | ORAL | Status: DC | PRN

## 2015-06-12 MED ORDER — FENOFIBRATE 160 MG PO TABS
160.0000 mg | ORAL_TABLET | Freq: Every day | ORAL | Status: DC
  Administered 2015-06-12 – 2015-06-15 (×4): 160 mg via ORAL
  Filled 2015-06-12 (×4): qty 1

## 2015-06-12 MED ORDER — ONDANSETRON HCL 4 MG/2ML IJ SOLN
4.0000 mg | Freq: Four times a day (QID) | INTRAMUSCULAR | Status: DC | PRN
  Administered 2015-06-12: 4 mg via INTRAVENOUS
  Filled 2015-06-12: qty 2

## 2015-06-12 MED ORDER — SIMVASTATIN 40 MG PO TABS
40.0000 mg | ORAL_TABLET | Freq: Every day | ORAL | Status: DC
  Administered 2015-06-12 – 2015-06-15 (×4): 40 mg via ORAL
  Filled 2015-06-12 (×4): qty 1

## 2015-06-12 MED ORDER — CARVEDILOL 12.5 MG PO TABS
25.0000 mg | ORAL_TABLET | Freq: Two times a day (BID) | ORAL | Status: DC
  Administered 2015-06-12 – 2015-06-13 (×3): 25 mg via ORAL
  Filled 2015-06-12 (×4): qty 2

## 2015-06-12 MED ORDER — BISACODYL 10 MG RE SUPP: 10.0000 mg | Freq: Every day | RECTAL | Status: DC | PRN

## 2015-06-12 MED ORDER — HYDRALAZINE HCL 25 MG PO TABS
25.0000 mg | ORAL_TABLET | Freq: Two times a day (BID) | ORAL | Status: DC
  Administered 2015-06-12 – 2015-06-13 (×2): 25 mg via ORAL
  Filled 2015-06-12 (×3): qty 1

## 2015-06-12 MED ORDER — MAGNESIUM SULFATE 2 GM/50ML IV SOLN
2.0000 g | Freq: Once | INTRAVENOUS | Status: AC
  Administered 2015-06-12: 2 g via INTRAVENOUS
  Filled 2015-06-12: qty 50

## 2015-06-12 MED ORDER — SODIUM CHLORIDE 0.9 % IV SOLN
INTRAVENOUS | Status: DC
  Administered 2015-06-14: 02:00:00 via INTRAVENOUS

## 2015-06-12 MED ORDER — LORATADINE 10 MG PO TABS
10.0000 mg | ORAL_TABLET | Freq: Every day | ORAL | Status: DC
  Administered 2015-06-12 – 2015-06-15 (×4): 10 mg via ORAL
  Filled 2015-06-12 (×4): qty 1

## 2015-06-12 NOTE — Consult Note (Signed)
NAMELAKIAH, DHINGRA                ACCOUNT NO.:  [id number]  MEDICAL RECORD NO.:  [id number]  LOCATION:  5C16C                        FACILITY:  [facility]  PHYSICIAN:  Leeroy Cha, M.D.   DATE OF BIRTH:  05-Feb-1951  DATE OF CONSULTATION: DATE OF DISCHARGE:                                CONSULTATION   Mrs. Castronovo is a lady who was transferred from [facility] because she fell and hit her head.  She is taking anticoagulant.  The patient at the [facility] had a CT scan of the head, which showed small traumatic hemorrhage in the right frontal area.  The cervical CT scan showed that she has a nondisplaced fracture of the C7.  I saw the patient early this morning about 4 o'clock.  Clinically, she is oriented x3.  She has no weakness.  Sensory normal.  Cranial nerves essentially negative.  She is in cervical collar, and she has quite a bit of ecchymoses all over her face.  I reviewed the CT scan which showed a small traumatic hemorrhage and the nondisplaced fracture of C7.  This lady does not require any surgical intervention.  We are going to advise for her to be in the collar for at least 6-8 weeks, and repeat the CT scan of the head in the next 24-48 hours.  We will follow while she is in the hospital.          ______________________________ Leeroy Cha, M.D.     EB/MEDQ  D:  06/12/2015  T:  06/12/2015  Job:  [id number]

## 2015-06-12 NOTE — Care Management Note (Signed)
Case Management Note  Patient Details  Name: Cheryl Hamilton MRN: [id number] Date of Birth: September 25, 1950  Subjective/Objective:                    Action/Plan: Patient admitted with falls and intracranial bleed. Patient is from home with family. Awaiting PT/OT recommendations for discharge disposition. CM will continue to follow for discharge needs.   Expected Discharge Date:                  Expected Discharge Plan:  Home/Self Care  In-House Referral:     Discharge planning Services     Post Acute Care Choice:    Choice offered to:     DME Arranged:    DME Agency:     HH Arranged:    HH Agency:     Status of Service:  In process, will continue to follow  Medicare Important Message Given:    Date Medicare IM Given:    Medicare IM give by:    Date Additional Medicare IM Given:    Additional Medicare Important Message give by:     If discussed at [facility] of Stay Meetings, dates discussed:    Additional Comments:  Pollie Friar, RN 06/12/2015, 11:11 AM

## 2015-06-12 NOTE — Progress Notes (Signed)
[facility] Progress Note   Cheryl Hamilton [facility]  IRW:[id number]  DOB: 28-Dec-1950  DOA: 06/11/2015 PCP: Sofie Hartigan, MD  Brief narrative: Cheryl Hamilton is a 64 y.o. female with a past medical history of coronary artery disease status post stenting, chronic systolic heart failure, scleroderma, Raynaud's disease and vaginal contents are fell at home 3 times yesterday and subsequently went to the ER at [facility]. CT scan in the ER revealed a small subarachnoid hemorrhage and contusion and C7 fracture and therefore she was transferred to [facility] for further evaluation by neurosurgery   Subjective: No complaints of headache. Has pain in her lower back. No focal numbness, weakness or dizziness. Admits to feeling woozy when she walks to the bathroom. No cough shortness of breath nausea or vomiting.  Assessment/Plan: Principal Problem:   Intracranial bleed (HCC)-left superior frontal contusion with trace subarachnoid bleed -Traumatic-neurosurgery recommending repeat CT head in 48 hours -Holding aspirin and Plavix  Active Problems: C7 fracture -Neurosurgery recommending that she continue to wear the collar for minimum of 6 weeks and then be reassessed  Acute hypoxic respiratory failure - Noted to become hypoxic overnight and currently is wearing 2 L of oxygen which are keeping her oxygen saturations 100%-according to the RN when oxygen is removed pulse ox drops significantly - Chest x-ray obtained reveals rib fractures which may be acute or subacute - the patient is not tender and states that she's had right-sided rib fractures and therefore these are likely chronic -No pulmonary edema noted on x-ray despite receiving IV hydration overnight - I have checked the pulse ox myself and it is not correlating with HR- will obtain an ABG  Multiple falls/orthostatic hypotension - Orthostatic vitals are positive- she does feel lightheaded when  ambulating - Continue holding diuretics and continuing slow IV hydration while monitoring respiratory status closely -PT eval  Mild troponin elevation -Doubtful that this is acute coronary syndrome-have reviewed EKG which is non acute  Anemia/thrombocytopenia -Acute drop in hemoglobin and platelets noted today-will recheck CBC now and again tomorrow morning  Hypokalemia -Replace and recheck tomorrow  Chronic systolic heart failure -Holding Lasix and Aldactone due to acute renal failure/dehydration -Continue ARB and beta blocker-   Raynaud disease/ Scleroderma   -continue Plaquenil  ARF (acute renal failure)   -Holding diuretics and hydrating-continue to follow   CAD (coronary artery disease) s/p PCI -At this time we'll need to hold aspirin and Plavix-patient states that stents were placed last year - repeat head CT to check for extension of bleed - will need to touch base with neurology prior to discharge to decide when to resume   Code Status:     Code Status Orders        Start     Ordered   06/12/15 0334  Full code   Continuous     06/12/15 0337     Family Communication:  Disposition Plan: Follow PT eval to determine if she is safe to discharge home DVT prophylaxis: SCDs Consultants: Neurosurgery  Antibiotics: Anti-infectives    Start     Dose/Rate Route Frequency Ordered Stop   06/12/15 1000  minocycline (MINOCIN,DYNACIN) capsule 100 mg     100 mg Oral 2 times daily 06/12/15 0019     06/12/15 1000  hydroxychloroquine (PLAQUENIL) tablet 200 mg     200 mg Oral 2 times daily 06/12/15 0019        Objective: Filed Weights   06/12/15 0149  Weight: 56.79 kg (  125 lb 3.2 oz)   No intake or output data in the 24 hours ending 06/12/15 1347   Vitals Filed Vitals:   06/12/15 0947 06/12/15 1123 06/12/15 1128 06/12/15 1132  BP: 129/64 143/62 95/56 85/50   Pulse: 95 37 108 84  Temp: 98.6 F (37 C)     TempSrc: Axillary     Resp: 18     Weight:      SpO2:  100% 100% 91% 67%    Exam:  General:  Pt is alert, not in acute distress  HEENT: No icterus, No thrush, oral mucosa moist  Cardiovascular: regular rate and rhythm, S1/S2 No murmur  Respiratory: clear to auscultation bilaterally   Abdomen: Soft, +Bowel sounds, non tender, non distended, no guarding  MSK: No LE edema, cyanosis or clubbing  Data Reviewed: Basic Metabolic Panel:  Recent Labs Lab 06/11/15 1748 06/12/15 0625  NA 139 139  K 3.5 3.0*  CL 103 108  CO2 24 21*  GLUCOSE 113* 76  BUN 71* 50*  CREATININE 3.05* 2.04*  CALCIUM 8.1* 7.5*   Liver Function Tests:  Recent Labs Lab 06/12/15 0625  AST 34  ALT 28  ALKPHOS 107  BILITOT 0.8  PROT 4.5*  ALBUMIN 2.1*   No results for input(s): LIPASE, AMYLASE in the last 168 hours.  Recent Labs Lab 06/12/15 0100  AMMONIA 45*   CBC:  Recent Labs Lab 06/11/15 1748 06/12/15 0625  WBC 12.3* 8.1  NEUTROABS  --  6.5  HGB 10.1* 8.8*  HCT 31.9* 28.5*  MCV 76.6* 79.4  PLT 215 132*   Cardiac Enzymes:  Recent Labs Lab 06/12/15 0100 06/12/15 0625 06/12/15 1207  TROPONINI 0.05* 0.05* 0.05*   BNP (last 3 results) No results for input(s): BNP in the last 8760 hours.  ProBNP (last 3 results) No results for input(s): PROBNP in the last 8760 hours.  CBG: No results for input(s): GLUCAP in the last 168 hours.  Recent Results (from the past 240 hour(s))  Urine culture     Status: None (Preliminary result)   Collection Time: 06/11/15  7:51 PM  Result Value Ref Range Status   Specimen Description URINE, RANDOM  Final   Special Requests NONE  Final   Culture NO GROWTH <24 HRS  Final   Report Status PENDING  Incomplete     Studies: Ct Head Wo Contrast  06/11/2015   CLINICAL DATA:  Multiple falls today and over the past month. Slurred speech and confusion. Scleroderma.  EXAM: CT HEAD WITHOUT CONTRAST  TECHNIQUE: Contiguous axial images were obtained from the base of the skull through the vertex without  intravenous contrast.  COMPARISON:  09/27/2014.  FINDINGS: Small 5 mm long axis focus of cortical contusion is observed in the LEFT superior frontal gyrus near the vertex. There is trace adjacent subarachnoid blood as seen on image 21.  No other areas concerning for parenchymal hemorrhage, acute infarction, mass lesion, or hydrocephalus. Slight premature atrophy. Chronic cerebral ischemia with hypoattenuation of white matter. Remote LEFT basal ganglia lacunar infarct.  No skull fracture is evident.  No scalp laceration.  Carotid siphon calcification without CT signs of proximal vascular thrombosis. Negative orbits. Negative sinuses and mastoids.  Compared with prior study in January, this abnormality was not present.  IMPRESSION: Small LEFT superior frontal cortical contusion with trace adjacent subarachnoid blood. This is likely posttraumatic. There is no significant mass effect. No skull fracture is observed.  Findings discussed with ordering provider.   Electronically Signed   By:  Staci Righter M.D.   On: 06/11/2015 19:16   Ct Cervical Spine Wo Contrast  06/11/2015   CLINICAL DATA:  Multiple falls. Confusion. Evaluate for cervical spine injury.  EXAM: CT CERVICAL SPINE WITHOUT CONTRAST  TECHNIQUE: Multidetector CT imaging of the cervical spine was performed without intravenous contrast. Multiplanar CT image reconstructions were also generated.  COMPARISON:  CT of the chest from 12/12/2014.  FINDINGS: There is fusion of the C5 and C6 vertebrae which is either congenital or non-instrumented ACDF. Solid arthrodesis.  There is abnormal loss of vertebral body height at C7, with slight anterior wedging and displacement of a small osseous fragment, but no significant anterior soft tissue swelling. There may be a slight vertical fracture component to the LEFT of midline, with minimal superior endplate depression. No visible intraspinal hematoma. Multilevel facet arthropathy. Anatomic alignment. No odontoid injury.   Moderate atheromatous and dolichoectatic change of the vasculature. No neck masses. Lung apices clear. Slight anterior wedging, but no retropulsion and no involvement of the pedicles.  The lower cervical spine is incompletely evaluated on prior CT chest, but I believe C7 is visualized and was normal at that time.  IMPRESSION: Findings consistent with a mild posttraumatic anterior wedge superior endplate compression fracture of C7. No significant retropulsion or posterior element involvement. This injury is likely stable. No visible associated intraspinal hematoma.  Findings d/w Estill Bamberg who will relay findings to the EDP.   Electronically Signed   By: Staci Righter M.D.   On: 06/11/2015 19:31   Dg Chest Port 1 View  06/12/2015   CLINICAL DATA:  Hypoxia.  EXAM: PORTABLE CHEST 1 VIEW  COMPARISON:  Earlier today.  FINDINGS: The cardiac silhouette remains mildly enlarged. Interval mild elevation of the left hemidiaphragm with stable mild left basilar atelectasis. Minimal right basilar atelectasis with improvement Otherwise, the lungs are hyperexpanded. Small amount of linear density at the left lung base. Diffuse osteopenia. Old left clavicle fracture and mildly comminuted distal right clavicle fracture with visible fracture lines on the right. Old, healed left rib fractures. There are also mildly displaced left eighth and minimally displaced left ninth posterior rib fractures without callus formation.  IMPRESSION: 1. Acute or subacute left eighth and ninth rib fractures. 2. Acute or subacute distal right clavicle fracture. 3. Old left clavicle fracture and old left rib fractures. 4. Interval mild elevation of the left hemidiaphragm. 5. Mild left basilar atelectasis without significant change and minimal right basilar atelectasis with improvement. 6. Mild cardiomegaly and mild changes of COPD.   Electronically Signed   By: Claudie Revering M.D.   On: 06/12/2015 12:33   Dg Chest Port 1 View  06/12/2015   CLINICAL DATA:   Shortness of breath.  Acute encephalopathy.  EXAM: PORTABLE CHEST 1 VIEW  COMPARISON:  11/24/2014 chest radiograph.  FINDINGS: Stable cardiomediastinal silhouette with mild cardiomegaly. No pneumothorax. Trace bilateral pleural effusions. Mild pulmonary edema. Mild bibasilar atelectasis.  IMPRESSION: 1. Mild congestive heart failure. 2. Trace bilateral pleural effusions and mild bibasilar atelectasis.   Electronically Signed   By: Ilona Sorrel M.D.   On: 06/12/2015 07:54   Dg Knee Complete 4 Views Right  06/11/2015   CLINICAL DATA:  Abrasion and ecchymosis after falling at home multiple times today.  EXAM: RIGHT KNEE - COMPLETE 4+ VIEW  COMPARISON:  None.  FINDINGS: There is no evidence of fracture, dislocation, or joint effusion. There is no evidence of arthropathy or other focal bone abnormality. Soft tissues are unremarkable.  IMPRESSION: Negative.  Electronically Signed   By: Andreas Newport M.D.   On: 06/11/2015 20:08    Scheduled Meds:  Scheduled Meds: . carvedilol  25 mg Oral BID WC  . fenofibrate  160 mg Oral Daily  . hydrALAZINE  25 mg Oral BID  . hydroxychloroquine  200 mg Oral BID  . ivabradine  5 mg Oral BID WC  . loratadine  10 mg Oral Daily  . minocycline  100 mg Oral BID  . pantoprazole  40 mg Oral BID  . simvastatin  40 mg Oral Daily  . triamcinolone  2 spray Nasal Daily   Continuous Infusions:   Time spent on care of this patient: 35 min   [place], MD 06/12/2015, 1:47 PM  LOS: 1 day   [facility] Office  [phone] Pager - Text Page per [email or web address] If 7PM-7AM, please contact night-coverage [email or web address]

## 2015-06-12 NOTE — Progress Notes (Signed)
Pt did awaken approx 0145 hrs and request assistance to restroom, ambulated to restroom with moderate assist, unsteady gait, back to bed, pt returned to being Somnolent with limited interaction, vitals with acceptable limits, Pt has become more responsive and quickly responds to voice, however tends remain with limited interaction must continually stimulate Pt to get responses which seem to be less that what appears that she is capable of.

## 2015-06-12 NOTE — Progress Notes (Signed)
Pt arrived on unit via EMS 2245hrs Alert to speech but very drowsy, limited interaction, Vitals within acceptable limits, 2lpm O2 due to SpO2 falling 80's. Pt skin color significantly altered, blue-gray due to medications, current SpO2 99% multiple bruises and abrasions all over body. On call MD notified.

## 2015-06-12 NOTE — Progress Notes (Signed)
Patient ID: Cheryl Hamilton, female   DOB: [birth year], 64 y.o.   MRN: [id number] Patient seen. Note to be dicated. No need for emergency surgical intervention

## 2015-06-12 NOTE — H&P (Signed)
[facility] History and Physical  Cheryl Hamilton HWE:[id number] DOB: 05-14-1951 DOA: 06/11/2015  Referring physician: Patient was transferred from [facility]. PCP: Sofie Hartigan, MD  Specialists: Dr. Humphrey Rolls. Cardiologist.  Chief Complaint: Frequent falls.  History obtained from the ER physician and patient's son. On exam patient is sedated.  HPI: Cheryl Hamilton is a 64 y.o. female with history of CAD status post stenting, chronic systolic heart failure last EF measured in May 2016 was 45%, scleroderma, Raynaud's disease and vaginal cancer was brought to the ER at [facility] after patient was found to have multiple falls. As per patient's son patient has been having frequent falls over the last 2 days. In the ER patient was found to have multiple bruises including on the face and lower extremities. CT of the head shows small subarachnoid hemorrhage and contusion and C-spine shows C7 endplate compression fracture. In addition agents creatinine also has worsened from baseline. Patient's son states patient has not been eating well for last 2-3 days because of nausea. Denies any vomiting. Patient's son states patient did not have any diarrhea or any shortness of breath or chest pain. Patient was given 1 L fluid bolus in the ER and on-call neurosurgeon Dr. Joya Salm was consulted and transferred to [facility]. On exam patient is sedated and does not follow commands. Patient did receive some pain medications at the ER at [facility].  Review of Systems: As presented in the history of presenting illness, rest negative.  Past Medical History  Diagnosis Date  . CHF (congestive heart failure) ([place])   . Hypertension   . Coronary artery disease   . Renal insufficiency   . Diabetes mellitus, type 2 ([place])   . Scleroderma ([place])   . Chronic back pain   . GERD (gastroesophageal reflux disease)   . Blue skin   . COPD  (chronic obstructive pulmonary disease) ([place])   . Raynaud disease   . Shortness of breath dyspnea   . Anginal pain ([place])   . Edema   . Hyperlipemia   . Ovarian cancer ([place])     chemo/rad  . Vaginal cancer ([place])   . Vulvar cancer [facility])    Past Surgical History  Procedure Laterality Date  . Back surgery  1990  . Vulva surgery    . Nasal sinus surgery    . Cervical fusion    . Abdominal hysterectomy    . Cardiac catheterization  12/23/2013  . Coronary angioplasty with stent placement  12/23/2013  . Cardiac catheterization N/A 01/10/2015    Procedure: Left Heart Cath and Coronary Angiography;  Surgeon: Dionisio David, MD;  Location: [facility];  Service: Cardiovascular;  Laterality: N/A;  . Breast biopsy Left 01/15/12    neg  . Breast biopsy Left     neg bx/clip  . Breast biopsy Right     neg-bx/clip   Social History:  reports that she quit smoking about 23 months ago. Her smoking use included Cigarettes. She does not have any smokeless tobacco history on file. She reports that she does not drink alcohol or use illicit drugs. Where does patient live home. Can patient participate in ADLs? Yes.  No Known Allergies  Family History:  Family History  Problem Relation Age of Onset  . Hypertension Other       Prior to Admission medications   Medication Sig Start Date End Date Taking? Authorizing Provider  acetaminophen (TYLENOL) 325 MG  tablet Take 325 mg by mouth every 4 (four) hours as needed.    Historical Provider, MD  albuterol (PROVENTIL HFA;VENTOLIN HFA) 108 (90 BASE) MCG/ACT inhaler Inhale 2 puffs into the lungs every 6 (six) hours as needed for wheezing or shortness of breath.    Historical Provider, MD  amitriptyline (ELAVIL) 25 MG tablet Take 1 tablet by mouth at bedtime. 06/05/15   Historical Provider, MD  amitriptyline (ELAVIL) 50 MG tablet Take 1 tablet by mouth at bedtime. 04/13/15   Historical Provider, MD  aspirin EC 81 MG tablet Take 81 mg by mouth daily.     Historical Provider, MD  benzonatate (TESSALON) 100 MG capsule Take 100 mg by mouth 3 (three) times daily. 03/21/15   Historical Provider, MD  bisacodyl (DULCOLAX) 10 MG suppository Place 10 mg rectally daily as needed for moderate constipation.    Historical Provider, MD  carvedilol (COREG) 25 MG tablet Take 25 mg by mouth 2 (two) times daily with a meal.    Historical Provider, MD  cetirizine (ZYRTEC) 5 MG tablet Take 5 mg by mouth daily.    Historical Provider, MD  clopidogrel (PLAVIX) 75 MG tablet Take 75 mg by mouth daily.    Historical Provider, MD  cyclobenzaprine (FLEXERIL) 5 MG tablet Take 1 tablet by mouth 3 (three) times daily as needed. 04/27/15   Historical Provider, MD  ergocalciferol (VITAMIN D2) 50000 UNITS capsule Take 50,000 Units by mouth once a week.    Historical Provider, MD  fenofibrate (TRICOR) 145 MG tablet Take 145 mg by mouth daily.    Historical Provider, MD  furosemide (LASIX) 20 MG tablet Take 20 mg by mouth daily.    Historical Provider, MD  hydrALAZINE (APRESOLINE) 25 MG tablet Take 25 mg by mouth 2 (two) times daily. 05/22/15   Historical Provider, MD  hydroxychloroquine (PLAQUENIL) 200 MG tablet Take 200 mg by mouth 2 (two) times daily.    Historical Provider, MD  ivabradine (CORLANOR) 5 MG TABS tablet Take 5 mg by mouth 2 (two) times daily with a meal.    Historical Provider, MD  KLOR-CON 10 10 MEQ tablet Take 1 tablet by mouth 2 (two) times daily. 06/08/15   Historical Provider, MD  lidocaine (XYLOCAINE) 5 % ointment Apply 1 application topically 3 (three) times daily as needed.    Historical Provider, MD  losartan (COZAAR) 25 MG tablet Take 25 mg by mouth daily. 05/31/15   Historical Provider, MD  Melatonin 3 MG TABS Take 1 tablet by mouth at bedtime.    Historical Provider, MD  minocycline (MINOCIN,DYNACIN) 100 MG capsule Take 1 capsule by mouth 2 (two) times daily. 04/13/15   Historical Provider, MD  nystatin-triamcinolone (MYCOLOG II) cream Apply 1 application  topically 3 (three) times daily.    Historical Provider, MD  pantoprazole (PROTONIX) 40 MG tablet Take 40 mg by mouth 2 (two) times daily.     Historical Provider, MD  simvastatin (ZOCOR) 40 MG tablet Take 40 mg by mouth daily. 05/22/15   Historical Provider, MD  sodium chloride (OCEAN) 0.65 % SOLN nasal spray Place 2 sprays into both nostrils 4 (four) times daily.    Historical Provider, MD  spironolactone (ALDACTONE) 25 MG tablet Take 25 mg by mouth daily.    Historical Provider, MD  traMADol (ULTRAM) 50 MG tablet Take 50 mg by mouth every 6 (six) hours as needed. for pain 05/16/15   Historical Provider, MD  triamcinolone (NASACORT) 55 MCG/ACT AERO nasal inhaler Place 2 sprays into the  nose daily.    Historical Provider, MD    Physical Exam: Filed Vitals:   06/11/15 2300  BP: 114/55  Pulse: 94  Temp: 98.1 F (36.7 C)  TempSrc: Oral  Resp: 20  SpO2: 99%     General:  Moderately built and nourished.  Eyes: Anicteric no pallor.  ENT: No discharge from the ears eyes nose and mouth.  Neck: Patient is in a collar.  Cardiovascular: S1-S2 heard.  Respiratory: No rhonchi or crepitations.  Abdomen: Soft nontender bowel sounds present.  Skin: Multiple bruises seen.  Musculoskeletal: No edema.  Psychiatric: Patient is sedated.  Neurologic: Patient is separated and further neurological exam not possible. Perla positive. No facial asymmetry.  Labs on Admission:  Basic Metabolic Panel:  Recent Labs Lab 06/11/15 1748  NA 139  K 3.5  CL 103  CO2 24  GLUCOSE 113*  BUN 71*  CREATININE 3.05*  CALCIUM 8.1*   Liver Function Tests: No results for input(s): AST, ALT, ALKPHOS, BILITOT, PROT, ALBUMIN in the last 168 hours. No results for input(s): LIPASE, AMYLASE in the last 168 hours. No results for input(s): AMMONIA in the last 168 hours. CBC:  Recent Labs Lab 06/11/15 1748  WBC 12.3*  HGB 10.1*  HCT 31.9*  MCV 76.6*  PLT 215   Cardiac Enzymes: No results for  input(s): CKTOTAL, CKMB, CKMBINDEX, TROPONINI in the last 168 hours.  BNP (last 3 results) No results for input(s): BNP in the last 8760 hours.  ProBNP (last 3 results) No results for input(s): PROBNP in the last 8760 hours.  CBG: No results for input(s): GLUCAP in the last 168 hours.  Radiological Exams on Admission: Ct Head Wo Contrast  06/11/2015   CLINICAL DATA:  Multiple falls today and over the past month. Slurred speech and confusion. Scleroderma.  EXAM: CT HEAD WITHOUT CONTRAST  TECHNIQUE: Contiguous axial images were obtained from the base of the skull through the vertex without intravenous contrast.  COMPARISON:  09/27/2014.  FINDINGS: Small 5 mm long axis focus of cortical contusion is observed in the LEFT superior frontal gyrus near the vertex. There is trace adjacent subarachnoid blood as seen on image 21.  No other areas concerning for parenchymal hemorrhage, acute infarction, mass lesion, or hydrocephalus. Slight premature atrophy. Chronic cerebral ischemia with hypoattenuation of white matter. Remote LEFT basal ganglia lacunar infarct.  No skull fracture is evident.  No scalp laceration.  Carotid siphon calcification without CT signs of proximal vascular thrombosis. Negative orbits. Negative sinuses and mastoids.  Compared with prior study in January, this abnormality was not present.  IMPRESSION: Small LEFT superior frontal cortical contusion with trace adjacent subarachnoid blood. This is likely posttraumatic. There is no significant mass effect. No skull fracture is observed.  Findings discussed with ordering provider.   Electronically Signed   By: Staci Righter M.D.   On: 06/11/2015 19:16   Ct Cervical Spine Wo Contrast  06/11/2015   CLINICAL DATA:  Multiple falls. Confusion. Evaluate for cervical spine injury.  EXAM: CT CERVICAL SPINE WITHOUT CONTRAST  TECHNIQUE: Multidetector CT imaging of the cervical spine was performed without intravenous contrast. Multiplanar CT image  reconstructions were also generated.  COMPARISON:  CT of the chest from 12/12/2014.  FINDINGS: There is fusion of the C5 and C6 vertebrae which is either congenital or non-instrumented ACDF. Solid arthrodesis.  There is abnormal loss of vertebral body height at C7, with slight anterior wedging and displacement of a small osseous fragment, but no significant anterior soft tissue  swelling. There may be a slight vertical fracture component to the LEFT of midline, with minimal superior endplate depression. No visible intraspinal hematoma. Multilevel facet arthropathy. Anatomic alignment. No odontoid injury.  Moderate atheromatous and dolichoectatic change of the vasculature. No neck masses. Lung apices clear. Slight anterior wedging, but no retropulsion and no involvement of the pedicles.  The lower cervical spine is incompletely evaluated on prior CT chest, but I believe C7 is visualized and was normal at that time.  IMPRESSION: Findings consistent with a mild posttraumatic anterior wedge superior endplate compression fracture of C7. No significant retropulsion or posterior element involvement. This injury is likely stable. No visible associated intraspinal hematoma.  Findings d/w Estill Bamberg who will relay findings to the EDP.   Electronically Signed   By: Staci Righter M.D.   On: 06/11/2015 19:31   Dg Knee Complete 4 Views Right  06/11/2015   CLINICAL DATA:  Abrasion and ecchymosis after falling at home multiple times today.  EXAM: RIGHT KNEE - COMPLETE 4+ VIEW  COMPARISON:  None.  FINDINGS: There is no evidence of fracture, dislocation, or joint effusion. There is no evidence of arthropathy or other focal bone abnormality. Soft tissues are unremarkable.  IMPRESSION: Negative.   Electronically Signed   By: Andreas Newport M.D.   On: 06/11/2015 20:08    EKG: Independently reviewed. Normal sinus rhythm with inferolateral ST-T changes.  Assessment/Plan Principal Problem:   Intracranial bleed (HCC) Active  Problems:   Raynaud disease   Chronic systolic heart failure (HCC)   Vaginal cancer (HCC)   Scleroderma (HCC)   ARF (acute renal failure) (HCC)   C7 cervical fracture (HCC)   CAD (coronary artery disease) s/p PCI.   Chronic anemia   Acute encephalopathy   1. Intracranial bleed and C7 compression fracture status post fall - I have discussed with Dr. Joya Salm on call neurosurgeon who will be seeing patient in consult. At this time Dr. Joya Salm is advised to continue with c-collar. Further recommendations per neurosurgery. 2. Acute on chronic renal failure - probably secondary to dehydration secondary to poor oral intake. I'm holding off patient's Cozaar Lasix spironolactone and patient has received 1 L normal saline in the ER and I am continuing with normal saline 100 mL per hour for next 12 hours. Caution that patient has chronic systolic heart failure and closely monitor respiratory status. 3. Frequent falls over the last 2 days - could be secondary to orthostatic changes. Holding off Lasix spironolactone. Continue gentle hydration and check orthostatics once patient is more alert and awake. If patient is ataxic may need further imaging. 4. Acute encephalopathy - at this time suspect primary secondary to pain medications. Will also be secondary to contusion. Closely observe. If does not improve then may consider MRI of brain. I'm going to check an ABG and ammonia levels. 5. Nausea with poor appetite - check LFTs. Check sonogram of the abdomen. 6. Chronic systolic heart failure last EF measured in May 2016 was 45% - holding off Cozaar Lasix spironolactone secondary to acute renal failure. Closely monitor respiratory status. 7. Chronic anemia - follow CBC. 8. History of vaginal cancer being followed by oncologist. 9. CAD status post stenting - check cardiac markers. Holding off Plavix and aspirin secondary to intracranial bleed.  I have reviewed patient's old charts some labs. Chest x-ray is  pending. Personally reviewed EKG. Discussed with on-call neurosurgeon.  DVT Prophylaxis - SCDs.  Code Status:  Full code.  Family Communication:  Discussed with patient's son.  Disposition Plan:  Admit to inpatient.    Sherle Mello N. [facility] Pager [phone].  If 7PM-7AM, please contact night-coverage [email or web address] Password [email or web address] 06/12/2015, 12:20 AM

## 2015-06-12 NOTE — Progress Notes (Signed)
Patient ID: Cheryl Hamilton, female   DOB: 04/11/1951, 63 y.o.   MRN: [id number] Neuro stable. repaeat ct head in the next 48 hours. Needs to use the cervical collar for at least 6 weeks.

## 2015-06-13 ENCOUNTER — Inpatient Hospital Stay (HOSPITAL_COMMUNITY): Payer: 59

## 2015-06-13 DIAGNOSIS — I509 Heart failure, unspecified: Secondary | ICD-10-CM

## 2015-06-13 DIAGNOSIS — M349 Systemic sclerosis, unspecified: Secondary | ICD-10-CM

## 2015-06-13 DIAGNOSIS — I629 Nontraumatic intracranial hemorrhage, unspecified: Secondary | ICD-10-CM

## 2015-06-13 DIAGNOSIS — S12600A Unspecified displaced fracture of seventh cervical vertebra, initial encounter for closed fracture: Secondary | ICD-10-CM

## 2015-06-13 DIAGNOSIS — S066X0S Traumatic subarachnoid hemorrhage without loss of consciousness, sequela: Secondary | ICD-10-CM

## 2015-06-13 DIAGNOSIS — N179 Acute kidney failure, unspecified: Secondary | ICD-10-CM

## 2015-06-13 DIAGNOSIS — I5022 Chronic systolic (congestive) heart failure: Secondary | ICD-10-CM

## 2015-06-13 DIAGNOSIS — I951 Orthostatic hypotension: Secondary | ICD-10-CM

## 2015-06-13 DIAGNOSIS — I7300 Raynaud's syndrome without gangrene: Secondary | ICD-10-CM

## 2015-06-13 DIAGNOSIS — D649 Anemia, unspecified: Secondary | ICD-10-CM

## 2015-06-13 DIAGNOSIS — S12691S Other nondisplaced fracture of seventh cervical vertebra, sequela: Secondary | ICD-10-CM

## 2015-06-13 LAB — BASIC METABOLIC PANEL
Anion gap: 6 (ref 5–15)
BUN: 37 mg/dL — AB (ref 6–20)
CHLORIDE: 112 mmol/L — AB (ref 101–111)
CO2: 23 mmol/L (ref 22–32)
CREATININE: 1.69 mg/dL — AB (ref 0.44–1.00)
Calcium: 7.8 mg/dL — ABNORMAL LOW (ref 8.9–10.3)
GFR calc Af Amer: 36 mL/min — ABNORMAL LOW (ref >60)
GFR calc non Af Amer: 31 mL/min — ABNORMAL LOW (ref >60)
GLUCOSE: 119 mg/dL — AB (ref 65–99)
POTASSIUM: 4.6 mmol/L (ref 3.5–5.1)
Sodium: 141 mmol/L (ref 135–145)

## 2015-06-13 LAB — URINE CULTURE: CULTURE: NO GROWTH

## 2015-06-13 LAB — CBC
HEMATOCRIT: 26.6 % — AB (ref 36.0–46.0)
Hemoglobin: 8 g/dL — ABNORMAL LOW (ref 12.0–15.0)
MCH: 24.4 pg — AB (ref 26.0–34.0)
MCHC: 30.1 g/dL (ref 30.0–36.0)
MCV: 81.1 fL (ref 78.0–100.0)
PLATELETS: 132 10*3/uL — AB (ref 150–400)
RBC: 3.28 MIL/uL — ABNORMAL LOW (ref 3.87–5.11)
RDW: 21.2 % — AB (ref 11.5–15.5)
WBC: 6.8 10*3/uL (ref 4.0–10.5)

## 2015-06-13 LAB — MAGNESIUM: MAGNESIUM: 2.2 mg/dL (ref 1.7–2.4)

## 2015-06-13 NOTE — Progress Notes (Signed)
  Echocardiogram 2D Echocardiogram has been performed.  Cheryl Hamilton 06/13/2015, 3:36 PM

## 2015-06-13 NOTE — Progress Notes (Signed)
PT Cancellation Note  Patient Details Name: Cheryl Hamilton MRN: [id number] DOB: 1950-09-13   Cancelled Treatment:    Reason Eval/Treat Not Completed: Patient declined, no reason specified. Pt reports she just got comfortable for the first time today. She did not want to get up. Pt is agreeable to work with PT tomorrow. Thanks,    Barbarann Ehlers. Terriyah Westra, PT, DPT [phone]  06/13/2015, 4:42 PM

## 2015-06-13 NOTE — Consult Note (Signed)
Physical Medicine and Rehabilitation Consult Reason for Consult: Traumatic subarachnoid hemorrhage and contusion with C7 fracture Referring Physician: [facility]   HPI: Cheryl Hamilton is a 64 y.o. right handed female with history of CAD with stenting maintained on aspirin, chronic systolic congestive heart failure, scleroderma, Raynaud's disease and vaginal cancer. Patient lives with her son who is disabled with short-term memory deficits and she uses a single-point cane and sometimes a walker prior to admission. Presented 06/12/2015 to the emergency room at [facility] after multiple falls. CT of the head showed a small left superior frontal cortical contusion with trace adjacent subarachnoid blood. Likely posttraumatic. No significant mass effect. CT cervical spine findings consistent with a mild posttraumatic anterior wedge superior endplate compression fracture of C7. No significant retropulsion or posterior element involvement. Neurosurgery Dr. Joya Salm advised conservative care. Placed in a cervical collar for 6-8 weeks. Hospital course pain management. Physical therapy evaluation completed 06/13/2015 with recommendations of physical medicine rehabilitation consult.   Review of Systems  Constitutional: Negative for fever and chills.  HENT: Negative for hearing loss.   Eyes: Negative for blurred vision and double vision.  Respiratory: Negative for cough.        Shortness of breath with exertion  Cardiovascular: Positive for palpitations and leg swelling.       Anginal chest pain  Gastrointestinal: Positive for constipation. Negative for nausea and vomiting.       GERD  Genitourinary: Negative for dysuria and hematuria.  Musculoskeletal: Positive for myalgias, back pain and falls.  Skin: Negative for rash.  Neurological: Positive for dizziness and weakness. Negative for seizures and headaches.   Past Medical History  Diagnosis Date  . CHF (congestive heart  failure) ([place])   . Hypertension   . Coronary artery disease   . Renal insufficiency   . Diabetes mellitus, type 2 ([place])   . Scleroderma ([place])   . Chronic back pain   . GERD (gastroesophageal reflux disease)   . Blue skin   . COPD (chronic obstructive pulmonary disease) ([place])   . Raynaud disease   . Shortness of breath dyspnea   . Anginal pain ([place])   . Edema   . Hyperlipemia   . Ovarian cancer ([place])     chemo/rad  . Vaginal cancer ([place])   . Vulvar cancer [facility])    Past Surgical History  Procedure Laterality Date  . Back surgery  1990  . Vulva surgery    . Nasal sinus surgery    . Cervical fusion    . Abdominal hysterectomy    . Cardiac catheterization  12/23/2013  . Coronary angioplasty with stent placement  12/23/2013  . Cardiac catheterization N/A 01/10/2015    Procedure: Left Heart Cath and Coronary Angiography;  Surgeon: Dionisio David, MD;  Location: [facility];  Service: Cardiovascular;  Laterality: N/A;  . Breast biopsy Left 01/15/12    neg  . Breast biopsy Left     neg bx/clip  . Breast biopsy Right     neg-bx/clip   Family History  Problem Relation Age of Onset  . Hypertension Other    Social History:  reports that she quit smoking about 23 months ago. Her smoking use included Cigarettes. She does not have any smokeless tobacco history on file. She reports that she does not drink alcohol or use illicit drugs. Allergies:  Allergies  Allergen Reactions  . Other Itching    Most narcotics cause itching, unsure of what  kinds   Medications Prior to Admission  Medication Sig Dispense Refill  . acetaminophen (TYLENOL) 325 MG tablet Take 325 mg by mouth every 4 (four) hours as needed.    Marland Kitchen albuterol (PROVENTIL HFA;VENTOLIN HFA) 108 (90 BASE) MCG/ACT inhaler Inhale 2 puffs into the lungs every 6 (six) hours as needed for wheezing or shortness of breath.    Marland Kitchen amitriptyline (ELAVIL) 50 MG tablet Take 1 tablet by mouth at bedtime.    Marland Kitchen aspirin EC 81 MG tablet  Take 81 mg by mouth daily.    . benzonatate (TESSALON) 100 MG capsule Take 100 mg by mouth 3 (three) times daily as needed for cough.   99  . carvedilol (COREG) 25 MG tablet Take 25 mg by mouth 2 (two) times daily with a meal.    . cetirizine (ZYRTEC) 5 MG tablet Take 5 mg by mouth daily.    . clopidogrel (PLAVIX) 75 MG tablet Take 75 mg by mouth daily.    . cyclobenzaprine (FLEXERIL) 5 MG tablet Take 1 tablet by mouth 3 (three) times daily as needed.  1  . ergocalciferol (VITAMIN D2) 50000 UNITS capsule Take 50,000 Units by mouth every Sunday.     . fenofibrate (TRICOR) 145 MG tablet Take 145 mg by mouth daily.    . furosemide (LASIX) 20 MG tablet Take 20 mg by mouth daily.    . hydrALAZINE (APRESOLINE) 25 MG tablet Take 25 mg by mouth 2 (two) times daily.  5  . hydroxychloroquine (PLAQUENIL) 200 MG tablet Take 200 mg by mouth 2 (two) times daily.    . ivabradine (CORLANOR) 5 MG TABS tablet Take 5 mg by mouth 2 (two) times daily with a meal.    . KLOR-CON 10 10 MEQ tablet Take 1 tablet by mouth 2 (two) times daily.    Marland Kitchen losartan (COZAAR) 25 MG tablet Take 25 mg by mouth daily.  4  . minocycline (MINOCIN,DYNACIN) 100 MG capsule Take 1 capsule by mouth 2 (two) times daily.    Marland Kitchen nystatin-triamcinolone (MYCOLOG II) cream Apply 1 application topically 2 (two) times daily as needed (for mouth).     . pantoprazole (PROTONIX) 40 MG tablet Take 40 mg by mouth 2 (two) times daily.     . simvastatin (ZOCOR) 40 MG tablet Take 40 mg by mouth daily.  5  . spironolactone (ALDACTONE) 25 MG tablet Take 25 mg by mouth daily.    Marland Kitchen triamcinolone (NASACORT) 55 MCG/ACT AERO nasal inhaler Place 2 sprays into the nose daily as needed (for allergies).       Home: Home Living Family/patient expects to be discharged to:: Unsure Living Arrangements: Children (reports son is disabled) Available Help at Discharge: Family Type of Home: House Home Access: Stairs to enter Technical brewer of Steps: 6-7 Entrance  Stairs-Rails: Right Home Layout: One level Bathroom Shower/Tub: Chiropodist: Standard Home Equipment: [employer] - single point, Grab bars - tub/shower, Environmental consultant - 2 wheels  Functional History: Prior Function Level of Independence: Independent with assistive device(s) Comments: used cane; limited cleaning Functional Status:  Mobility: Bed Mobility Overal bed mobility: Needs Assistance Bed Mobility: Rolling, Sidelying to Sit, Sit to Sidelying Rolling: Min assist Sidelying to sit: Mod assist Sit to sidelying: Mod assist General bed mobility comments: cues for technique. Transfers Overall transfer level: Needs assistance Transfers: Sit to/from Stand Sit to Stand: Min guard General transfer comment: RW in front for support       ADL: ADL Overall ADL's : Needs assistance/impaired  Eating/Feeding: Supervision/ safety, Sitting, Set up, Bed level Grooming: Bed level, Sitting, Wash/dry hands, Moderate assistance Lower Body Dressing: Maximal assistance, Sit to/from stand Toilet Transfer: Minimal assistance, Min guard, Ambulation, RW (Min guard-sit to stand ) Functional mobility during ADLs: Minimal assistance, Rolling walker  Cognition: Cognition Overall Cognitive Status: No family/caregiver present to determine baseline cognitive functioning Orientation Level: Oriented X4 Cognition Arousal/Alertness: Awake/alert Behavior During Therapy: Anxious Overall Cognitive Status: No family/caregiver present to determine baseline cognitive functioning  Blood pressure 98/48, pulse 79, temperature 98 F (36.7 C), temperature source Oral, resp. rate 20, weight 61.553 kg (135 lb 11.2 oz), SpO2 97 %. Physical Exam  Constitutional: She is oriented to person, place, and time.  64 year old frail right-handed female  HENT:  Multiple bruises to the face.  Eyes: EOM are normal.  Neck:  Cervical collar in place---TOO LARGE  Cardiovascular: Regular rhythm.   Respiratory: Effort  normal and breath sounds normal. No respiratory distress.  GI: Soft. Bowel sounds are normal. She exhibits no distension.  Neurological: She is alert and oriented to person, place, and time.  Moves all 4's. UE grossly 4/5 prox to distal. LE: 3+hf, 4/5 ke an adf/pf. Senses pain and LT in all 4. Cognitively displays good insight and awareness  Skin: Skin is warm and dry.  Psychiatric: She has a normal mood and affect. Her behavior is normal.    Results for orders placed or performed during the hospital encounter of 06/11/15 (from the past 24 hour(s))  Blood gas, arterial     Status: Abnormal   Collection Time: 06/12/15  3:36 PM  Result Value Ref Range   O2 Content 0.2 L/min   Delivery systems ROOM AIR    pH, Arterial 7.370 7.350 - 7.450   pCO2 arterial 33.9 (L) 35.0 - 45.0 mmHg   pO2, Arterial 50.1 (L) 80.0 - 100.0 mmHg   Bicarbonate 19.1 (L) 20.0 - 24.0 mEq/L   TCO2 20.2 0 - 100 mmol/L   Acid-base deficit 5.2 (H) 0.0 - 2.0 mmol/L   O2 Saturation 81.8 %   Patient temperature 98.6    Collection site RIGHT BRACHIAL    Drawn by [id number]    Sample type ARTERIAL DRAW    Allens test (pass/fail) PASS PASS  CBC     Status: Abnormal   Collection Time: 06/12/15  5:06 PM  Result Value Ref Range   WBC 13.0 (H) 4.0 - 10.5 K/uL   RBC 3.98 3.87 - 5.11 MIL/uL   Hemoglobin 9.7 (L) 12.0 - 15.0 g/dL   HCT 32.5 (L) 36.0 - 46.0 %   MCV 81.7 78.0 - 100.0 fL   MCH 24.4 (L) 26.0 - 34.0 pg   MCHC 29.8 (L) 30.0 - 36.0 g/dL   RDW 21.0 (H) 11.5 - 15.5 %   Platelets 173 150 - 400 K/uL  Magnesium     Status: Abnormal   Collection Time: 06/12/15  5:06 PM  Result Value Ref Range   Magnesium 1.4 (L) 1.7 - 2.4 mg/dL  Basic metabolic panel     Status: Abnormal   Collection Time: 06/13/15  4:01 AM  Result Value Ref Range   Sodium 141 135 - 145 mmol/L   Potassium 4.6 3.5 - 5.1 mmol/L   Chloride 112 (H) 101 - 111 mmol/L   CO2 23 22 - 32 mmol/L   Glucose, Bld 119 (H) 65 - 99 mg/dL   BUN 37 (H) 6 - 20 mg/dL    Creatinine, Ser 1.69 (H) 0.44 - 1.00 mg/dL  Calcium 7.8 (L) 8.9 - 10.3 mg/dL   GFR calc non Af Amer 31 (L) >60 mL/min   GFR calc Af Amer 36 (L) >60 mL/min   Anion gap 6 5 - 15  CBC     Status: Abnormal   Collection Time: 06/13/15  4:01 AM  Result Value Ref Range   WBC 6.8 4.0 - 10.5 K/uL   RBC 3.28 (L) 3.87 - 5.11 MIL/uL   Hemoglobin 8.0 (L) 12.0 - 15.0 g/dL   HCT 26.6 (L) 36.0 - 46.0 %   MCV 81.1 78.0 - 100.0 fL   MCH 24.4 (L) 26.0 - 34.0 pg   MCHC 30.1 30.0 - 36.0 g/dL   RDW 21.2 (H) 11.5 - 15.5 %   Platelets 132 (L) 150 - 400 K/uL  Magnesium     Status: None   Collection Time: 06/13/15  4:01 AM  Result Value Ref Range   Magnesium 2.2 1.7 - 2.4 mg/dL   Ct Head Wo Contrast  06/13/2015   CLINICAL DATA:  Followup closed head injury.  EXAM: CT HEAD WITHOUT CONTRAST  TECHNIQUE: Contiguous axial images were obtained from the base of the skull through the vertex without intravenous contrast.  COMPARISON:  Most recent 06/11/2015.  FINDINGS: Improved LEFT superior frontal cortical contusion, with adjacent subarachnoid blood. No new areas of hemorrhage. No significant mass effect or regional edema. Stable chronic changes as previously identified with mild atrophy and small vessel disease. No skull fracture. LEFT paramedian vertex scalp calcifications stable.  IMPRESSION: Improved LEFT superior frontal cortical contusion and adjacent subarachnoid blood. No significant worsening compared with priors.   Electronically Signed   By: Staci Righter M.D.   On: 06/13/2015 13:45   Ct Head Wo Contrast  06/11/2015   CLINICAL DATA:  Multiple falls today and over the past month. Slurred speech and confusion. Scleroderma.  EXAM: CT HEAD WITHOUT CONTRAST  TECHNIQUE: Contiguous axial images were obtained from the base of the skull through the vertex without intravenous contrast.  COMPARISON:  09/27/2014.  FINDINGS: Small 5 mm long axis focus of cortical contusion is observed in the LEFT superior frontal gyrus near  the vertex. There is trace adjacent subarachnoid blood as seen on image 21.  No other areas concerning for parenchymal hemorrhage, acute infarction, mass lesion, or hydrocephalus. Slight premature atrophy. Chronic cerebral ischemia with hypoattenuation of white matter. Remote LEFT basal ganglia lacunar infarct.  No skull fracture is evident.  No scalp laceration.  Carotid siphon calcification without CT signs of proximal vascular thrombosis. Negative orbits. Negative sinuses and mastoids.  Compared with prior study in January, this abnormality was not present.  IMPRESSION: Small LEFT superior frontal cortical contusion with trace adjacent subarachnoid blood. This is likely posttraumatic. There is no significant mass effect. No skull fracture is observed.  Findings discussed with ordering provider.   Electronically Signed   By: Staci Righter M.D.   On: 06/11/2015 19:16   Ct Cervical Spine Wo Contrast  06/11/2015   CLINICAL DATA:  Multiple falls. Confusion. Evaluate for cervical spine injury.  EXAM: CT CERVICAL SPINE WITHOUT CONTRAST  TECHNIQUE: Multidetector CT imaging of the cervical spine was performed without intravenous contrast. Multiplanar CT image reconstructions were also generated.  COMPARISON:  CT of the chest from 12/12/2014.  FINDINGS: There is fusion of the C5 and C6 vertebrae which is either congenital or non-instrumented ACDF. Solid arthrodesis.  There is abnormal loss of vertebral body height at C7, with slight anterior wedging and displacement of a small  osseous fragment, but no significant anterior soft tissue swelling. There may be a slight vertical fracture component to the LEFT of midline, with minimal superior endplate depression. No visible intraspinal hematoma. Multilevel facet arthropathy. Anatomic alignment. No odontoid injury.  Moderate atheromatous and dolichoectatic change of the vasculature. No neck masses. Lung apices clear. Slight anterior wedging, but no retropulsion and no  involvement of the pedicles.  The lower cervical spine is incompletely evaluated on prior CT chest, but I believe C7 is visualized and was normal at that time.  IMPRESSION: Findings consistent with a mild posttraumatic anterior wedge superior endplate compression fracture of C7. No significant retropulsion or posterior element involvement. This injury is likely stable. No visible associated intraspinal hematoma.  Findings d/w Estill Bamberg who will relay findings to the EDP.   Electronically Signed   By: Staci Righter M.D.   On: 06/11/2015 19:31   Dg Chest Port 1 View  06/12/2015   CLINICAL DATA:  Hypoxia.  EXAM: PORTABLE CHEST 1 VIEW  COMPARISON:  Earlier today.  FINDINGS: The cardiac silhouette remains mildly enlarged. Interval mild elevation of the left hemidiaphragm with stable mild left basilar atelectasis. Minimal right basilar atelectasis with improvement Otherwise, the lungs are hyperexpanded. Small amount of linear density at the left lung base. Diffuse osteopenia. Old left clavicle fracture and mildly comminuted distal right clavicle fracture with visible fracture lines on the right. Old, healed left rib fractures. There are also mildly displaced left eighth and minimally displaced left ninth posterior rib fractures without callus formation.  IMPRESSION: 1. Acute or subacute left eighth and ninth rib fractures. 2. Acute or subacute distal right clavicle fracture. 3. Old left clavicle fracture and old left rib fractures. 4. Interval mild elevation of the left hemidiaphragm. 5. Mild left basilar atelectasis without significant change and minimal right basilar atelectasis with improvement. 6. Mild cardiomegaly and mild changes of COPD.   Electronically Signed   By: Claudie Revering M.D.   On: 06/12/2015 12:33   Dg Chest Port 1 View  06/12/2015   CLINICAL DATA:  Shortness of breath.  Acute encephalopathy.  EXAM: PORTABLE CHEST 1 VIEW  COMPARISON:  11/24/2014 chest radiograph.  FINDINGS: Stable cardiomediastinal  silhouette with mild cardiomegaly. No pneumothorax. Trace bilateral pleural effusions. Mild pulmonary edema. Mild bibasilar atelectasis.  IMPRESSION: 1. Mild congestive heart failure. 2. Trace bilateral pleural effusions and mild bibasilar atelectasis.   Electronically Signed   By: Ilona Sorrel M.D.   On: 06/12/2015 07:54   Dg Knee Complete 4 Views Right  06/11/2015   CLINICAL DATA:  Abrasion and ecchymosis after falling at home multiple times today.  EXAM: RIGHT KNEE - COMPLETE 4+ VIEW  COMPARISON:  None.  FINDINGS: There is no evidence of fracture, dislocation, or joint effusion. There is no evidence of arthropathy or other focal bone abnormality. Soft tissues are unremarkable.  IMPRESSION: Negative.   Electronically Signed   By: Andreas Newport M.D.   On: 06/11/2015 20:08    Assessment/Plan: Diagnosis: Traumatic SAH, C7 compression fx after fall 1. Does the need for close, 24 hr/day medical supervision in concert with the patient's rehab needs make it unreasonable for this patient to be served in a less intensive setting? Yes 2. Co-Morbidities requiring supervision/potential complications: raynauds, ARF, CAD, CSCHF 3. Due to bladder management, bowel management, safety, skin/wound care, disease management, medication administration, pain management and patient education, does the patient require 24 hr/day rehab nursing? Yes 4. Does the patient require coordinated care of a physician, rehab nurse,  PT (1-2 hrs/day, 5 days/week), OT (1-2 hrs/day, 5 days/week) and SLP (1-2 hrs/day, 5 days/week) to address physical and functional deficits in the context of the above medical diagnosis(es)? Yes Addressing deficits in the following areas: balance, endurance, locomotion, strength, transferring, bowel/bladder control, bathing, dressing, feeding, grooming, toileting, cognition, speech and psychosocial support 5. Can the patient actively participate in an intensive therapy program of at least 3 hrs of therapy  per day at least 5 days per week? Yes 6. The potential for patient to make measurable gains while on inpatient rehab is excellent 7. Anticipated functional outcomes upon discharge from inpatient rehab are modified independent and supervision  with PT, modified independent and supervision with OT, modified independent and supervision with SLP. 8. Estimated rehab length of stay to reach the above functional goals is: 13-18 days 9. Does the patient have adequate social supports and living environment to accommodate these discharge functional goals? Yes and Potentially 10. Anticipated D/C setting: Home 11. Anticipated post D/C treatments: HH therapy and Outpatient therapy 12. Overall Rehab/Functional Prognosis: excellent  RECOMMENDATIONS: This patient's condition is appropriate for continued rehabilitative care in the following setting: CIR Patient has agreed to participate in recommended program. Yes Note that insurance prior authorization may be required for reimbursement for recommended care.  Comment: Rehab Admissions Coordinator to follow up.  Thanks,  Meredith Staggers, MD, [facility]     06/13/2015

## 2015-06-13 NOTE — Progress Notes (Signed)
[facility]  CONSULT SERVICE NOTE  Pt out of room for test. Will follow up in AM.   Meredith Staggers, MD, [facility] 06/13/2015

## 2015-06-13 NOTE — Evaluation (Signed)
Occupational Therapy Evaluation Patient Details Name: Cheryl Hamilton MRN: [id number] DOB: 1951/04/29 Today's Date: 06/13/2015    History of Present Illness 64 y.o. female with a past medical history of coronary artery disease status post stenting, chronic systolic heart failure, scleroderma, Raynaud's disease and vaginal contents are fell at home 3 times yesterday and subsequently went to the ER at [facility]. CT scan in the ER revealed a small subarachnoid hemorrhage and contusion and C7 fracture and therefore she was transferred to [facility] for further evaluation by neurosurgery   Clinical Impression   Pt admitted with above. Pt independent with ADLs, PTA. Feel pt will benefit from acute OT to increase independence prior to d/c. Recommending CIR consult.    Follow Up Recommendations  CIR    Equipment Recommendations  Other (comment) (defer to next venue)    Recommendations for Other Services       Precautions / Restrictions Precautions Precautions: Fall;Cervical Precaution Comments: educated  Required Braces or Orthoses: Cervical Brace Cervical Brace: Other (comment);At all times ([place] collar) Restrictions Weight Bearing Restrictions: No      Mobility Bed Mobility Overal bed mobility: Needs Assistance Bed Mobility: Rolling;Sidelying to Sit;Sit to Sidelying Rolling: Min assist Sidelying to sit: Mod assist     Sit to sidelying: Mod assist General bed mobility comments: cues for technique.  Transfers Overall transfer level: Needs assistance   Transfers: Sit to/from Stand Sit to Stand: Min guard         General transfer comment: RW in front for support     Balance    History of falls. Balance not formally assessed. Used RW for ambulation.                                        ADL Overall ADL's : Needs assistance/impaired Eating/Feeding: Supervision/ safety;Sitting;Set up;Bed level   Grooming:  Bed level;Sitting;Wash/dry hands;Moderate assistance               Lower Body Dressing: Maximal assistance;Sit to/from stand   Toilet Transfer: Minimal assistance;Min guard;Ambulation;RW (Min guard-sit to stand )           Functional mobility during ADLs: Minimal assistance;Rolling walker   ADL comments: Pt able to wash hands with no physical assist needed, but suspect some other grooming tasks would be difficult due to pain.       Vision  Pt wears glasses.   Perception     Praxis      Pertinent Vitals/Pain Pain Assessment: 0-10 Pain Score: 10-Worst pain ever Pain Location: left side and arms-left arm worse than right Pain Descriptors / Indicators: Sharp Pain Intervention(s): Limited activity within patient's tolerance;Monitored during session;Other (comment) (notified nurse)     Hand Dominance     Extremity/Trunk Assessment Upper Extremity Assessment Upper Extremity Assessment:  (pain in arms when asked to raise arms(shoulder flexion limited)   Lower Extremity Assessment Lower Extremity Assessment: Defer to PT evaluation       Communication Communication Communication: No difficulties   Cognition Arousal/Alertness: Awake/alert Behavior During Therapy: Anxious Overall Cognitive Status: No family/caregiver present to determine baseline cognitive functioning (pt fixated on cleaning syrup off of hands)                     General Comments       Exercises       Shoulder Instructions  Home Living Family/patient expects to be discharged to:: Unsure Living Arrangements: Children (reports son is disabled) Available Help at Discharge: Family Type of Home: House Home Access: Stairs to enter Technical brewer of Steps: 6-7 Entrance Stairs-Rails: Right Home Layout: One level     Bathroom Shower/Tub: Teacher, early years/pre: Standard     Home Equipment: Cane - single point;Grab bars - tub/shower;Walker - 2 wheels           Prior Functioning/Environment Level of Independence: Independent with assistive device(s)        Comments: used cane; limited cleaning    OT Diagnosis: Acute pain   OT Problem List: Decreased strength;Decreased knowledge of precautions;Decreased knowledge of use of DME or AE;Impaired balance (sitting and/or standing);Decreased range of motion;Decreased activity tolerance;Pain   OT Treatment/Interventions: Self-care/ADL training;DME and/or AE instruction;Therapeutic activities;Patient/family education;Balance training;Cognitive remediation/compensation;Therapeutic exercise    OT Goals(Current goals can be found in the care plan section) Acute Rehab OT Goals Patient Stated Goal: not stated OT Goal Formulation: With patient Time For Goal Achievement: 06/20/15 Potential to Achieve Goals: Good ADL Goals Pt Will Perform Upper Body Dressing: sitting;with min assist (clothing (not cervical collar)) Pt Will Perform Lower Body Dressing: with min assist;with adaptive equipment;sit to/from stand Pt Will Transfer to Toilet: with supervision;ambulating Pt Will Perform Toileting - Clothing Manipulation and hygiene: with supervision;sit to/from stand;with set-up  OT Frequency: Min 2X/week   Barriers to D/C:            Co-evaluation              End of Session Equipment Utilized During Treatment: Rolling walker;Gait belt;Cervical collar Nurse Communication: Other (comment);Mobility status (pain; fixated on getting hands clean)  Activity Tolerance: Patient limited by pain Patient left: in bed;with call bell/phone within reach;with bed alarm set   Time: 1537-9432 OT Time Calculation (min): 18 min Charges:  OT General Charges $OT Visit: 1 Procedure OT Evaluation $Initial OT Evaluation Tier I: 1 Procedure G-CodesBenito Mccreedy OTR/L [phone] 06/13/2015, 10:49 AM

## 2015-06-13 NOTE — Progress Notes (Addendum)
Inpatient Rehabilitation  Patient was screened by Gerlean Ren for appropriateness for an Inpatient Acute Rehab consult.  At this time, we are recommending Inpatient Rehab consult.  Please order when you feel appropriate.  I have text paged Dr. Sloan Leiter with recommendation for rehab consult.  [place] Admissions Coordinator Cell [phone] Office [phone]

## 2015-06-13 NOTE — Progress Notes (Signed)
PATIENT DETAILS Name: Cheryl Hamilton Age: 64 y.o. Sex: female Date of Birth: [birth year] Admit Date: 06/11/2015 Admitting Physician Rise Patience, MD ION:[id number], Chrissie Noa, MD  Brief narrative: Cheryl Hamilton is a 64 y.o. female with a past medical history of coronary artery disease status post stenting, chronic systolic heart failure, scleroderma, Raynaud's disease and vaginal contents are fell at home 3 times yesterday and subsequently went to the ER at [facility]. CT scan in the ER revealed a small subarachnoid hemorrhage and contusion and C7 fracture and therefore she was transferred to [facility] for further evaluation by neurosurgery  Subjective: Awake, alert. Complains of pain on her left side of the chest when she takes a deep breath.  Assessment/Plan: Principal Problem: Intracranial bleed (HCC)-left superior frontal contusion with trace subarachnoid bleed: Traumatic, continue to hold aspirin and Plavix. Neurosurgery following, repeat CT head on 10/4 shows no significant worsening when compared with prior CT.  Active Problems: Nondisplaced fracture of C7 vertebra: Cervical collar in place, nonsurgical management per neurosurgery.  Multiple falls: Secondary to orthostatic hypotension. PT eval completed, recommendations are for CIR-will consult  Orthostatic hypotension: No history of vomiting, diarrhea or GI bleeding. Suspect secondary to diuretics, antihypertensives. Continue to cautiously hydrate and continue to hold culprit medications. Repeat orthostatics today.  Acute renal failure: Secondary to prerenal azotemia-due to diuretics/antihypertensives. Improving with IV fluids. Follow  ? Acute hypoxic respiratory failure: Suspect secondary to atelectasis/left rib fracture. Not short of breath, O2 saturation this morning at 97% on room air. Supportive care with incentive spirometry and bronchodilators. Follow and initiate  further workup if only persistently hypoxic.  Anemia: Chronic microcytic anemia on initial presentation to the hospital-hemoglobin has gradually decreased. No bowel movement since admission-doubt GI bleed at this time. Suspect decrease is mostly from IV fluid dilution and acute illness. Continue to follow on monitor closely. Check FOBT and anemia panel  Mild thrombocytopenia: Follow for now. If significant drop, then will start further evaluation.  Chronic systolic heart failure: EF per last cardiac cath on 01/10/15 around 45%. Continue to hold diuretics, beta blocker. Continue Corlanor  Minimal troponin elevation: Suspect false positive elevation in the setting of acute renal failure. Check echocardiogram. EKG nonacute.  History of scleroderma/Raynaud's phenomena: Continue Plaquenil and minocycline.  History of vaginal cancer: Outpatient follow-up with oncology.  History of CAD: Status post PCI in March 2015-given ICH-aspirin/Plavix on hold. Beta blocker on hold given hypotension/orthostatics.  Dyslipidemia: Continue fenofibrate.  Disposition: Remain inpatient  Antimicrobial agents  See below  Anti-infectives    Start     Dose/Rate Route Frequency Ordered Stop   06/12/15 1000  minocycline (MINOCIN,DYNACIN) capsule 100 mg     100 mg Oral 2 times daily 06/12/15 0019     06/12/15 1000  hydroxychloroquine (PLAQUENIL) tablet 200 mg     200 mg Oral 2 times daily 06/12/15 0019        DVT Prophylaxis: SCD's  Code Status: Full code   Family Communication None at bedside  Procedures: None  CONSULTS:  Neurosurgery  Time spent 25 minutes-Greater than 50% of this time was spent in counseling, explanation of diagnosis, planning of further management, and coordination of care.  MEDICATIONS: Scheduled Meds: . carvedilol  25 mg Oral BID WC  . fenofibrate  160 mg Oral Daily  . hydrALAZINE  25 mg Oral BID  . hydroxychloroquine  200 mg Oral BID  .  ivabradine  5 mg Oral BID WC  .  loratadine  10 mg Oral Daily  . minocycline  100 mg Oral BID  . pantoprazole  40 mg Oral BID  . simvastatin  40 mg Oral Daily  . triamcinolone  2 spray Nasal Daily   Continuous Infusions: . sodium chloride 700 mL (06/12/15 1400)   PRN Meds:.acetaminophen **OR** acetaminophen, albuterol, bisacodyl, cyclobenzaprine, ondansetron **OR** ondansetron (ZOFRAN) IV    PHYSICAL EXAM: Vital signs in last 24 hours: Filed Vitals:   06/13/15 0209 06/13/15 0500 06/13/15 0625 06/13/15 1026  BP:   167/69 98/48  Pulse:   68 79  Temp: 97.5 F (36.4 C)  98.2 F (36.8 C) 98 F (36.7 C)  TempSrc: Axillary  Oral Oral  Resp:   20 20  Weight:  61.553 kg (135 lb 11.2 oz)    SpO2: 100%   97%    Weight change: 4.763 kg (10 lb 8 oz) Filed Weights   06/12/15 0149 06/13/15 0500  Weight: 56.79 kg (125 lb 3.2 oz) 61.553 kg (135 lb 11.2 oz)   Body mass index is 26.5 kg/(m^2).   Gen Exam: Awake and alert with clear speech.   Neck: Cervical collar in place Chest: B/L Clear.  CVS: S1 S2 Regular, no murmurs.  Abdomen: soft, BS +, non tender, non distended.  Extremities: no edema, lower extremities warm to touch. Neurologic: Non Focal.   Skin: No Rash.   Wounds: N/A.    Intake/Output from previous day: No intake or output data in the 24 hours ending 06/13/15 1409   LAB RESULTS: CBC  Recent Labs Lab 06/11/15 1748 06/12/15 0625 06/12/15 1706 06/13/15 0401  WBC 12.3* 8.1 13.0* 6.8  HGB 10.1* 8.8* 9.7* 8.0*  HCT 31.9* 28.5* 32.5* 26.6*  PLT 215 132* 173 132*  MCV 76.6* 79.4 81.7 81.1  MCH 24.3* 24.5* 24.4* 24.4*  MCHC 31.7* 30.9 29.8* 30.1  RDW 22.1* 20.7* 21.0* 21.2*  LYMPHSABS  --  0.9  --   --   MONOABS  --  0.6  --   --   EOSABS  --  0.1  --   --   BASOSABS  --  0.0  --   --     Chemistries   Recent Labs Lab 06/11/15 1748 06/12/15 0625 06/12/15 1706 06/13/15 0401  NA 139 139  --  141  K 3.5 3.0*  --  4.6  CL 103 108  --  112*  CO2 24 21*  --  23  GLUCOSE 113* 76  --   119*  BUN 71* 50*  --  37*  CREATININE 3.05* 2.04*  --  1.69*  CALCIUM 8.1* 7.5*  --  7.8*  MG  --   --  1.4* 2.2    CBG: No results for input(s): GLUCAP in the last 168 hours.  GFR Estimated Creatinine Clearance: 27.6 mL/min (by C-G formula based on Cr of 1.69).  Coagulation profile No results for input(s): INR, PROTIME in the last 168 hours.  Cardiac Enzymes  Recent Labs Lab 06/12/15 0100 06/12/15 0625 06/12/15 1207  TROPONINI 0.05* 0.05* 0.05*    Invalid input(s): POCBNP No results for input(s): DDIMER in the last 72 hours. No results for input(s): HGBA1C in the last 72 hours. No results for input(s): CHOL, HDL, LDLCALC, TRIG, CHOLHDL, LDLDIRECT in the last 72 hours. No results for input(s): TSH, T4TOTAL, T3FREE, THYROIDAB in the last 72 hours.  Invalid input(s): FREET3 No results for input(s): VITAMINB12, FOLATE, FERRITIN,  TIBC, IRON, RETICCTPCT in the last 72 hours. No results for input(s): LIPASE, AMYLASE in the last 72 hours.  Urine Studies No results for input(s): UHGB, CRYS in the last 72 hours.  Invalid input(s): UACOL, UAPR, USPG, UPH, UTP, UGL, UKET, UBIL, UNIT, UROB, ULEU, UEPI, UWBC, URBC, UBAC, CAST, UCOM, BILUA  MICROBIOLOGY: Recent Results (from the past 240 hour(s))  Urine culture     Status: None   Collection Time: 06/11/15  7:51 PM  Result Value Ref Range Status   Specimen Description URINE, RANDOM  Final   Special Requests NONE  Final   Culture NO GROWTH 2 DAYS  Final   Report Status 06/13/2015 FINAL  Final    RADIOLOGY STUDIES/RESULTS: Ct Head Wo Contrast  06/13/2015   CLINICAL DATA:  Followup closed head injury.  EXAM: CT HEAD WITHOUT CONTRAST  TECHNIQUE: Contiguous axial images were obtained from the base of the skull through the vertex without intravenous contrast.  COMPARISON:  Most recent 06/11/2015.  FINDINGS: Improved LEFT superior frontal cortical contusion, with adjacent subarachnoid blood. No new areas of hemorrhage. No significant  mass effect or regional edema. Stable chronic changes as previously identified with mild atrophy and small vessel disease. No skull fracture. LEFT paramedian vertex scalp calcifications stable.  IMPRESSION: Improved LEFT superior frontal cortical contusion and adjacent subarachnoid blood. No significant worsening compared with priors.   Electronically Signed   By: Staci Righter M.D.   On: 06/13/2015 13:45   Ct Head Wo Contrast  06/11/2015   CLINICAL DATA:  Multiple falls today and over the past month. Slurred speech and confusion. Scleroderma.  EXAM: CT HEAD WITHOUT CONTRAST  TECHNIQUE: Contiguous axial images were obtained from the base of the skull through the vertex without intravenous contrast.  COMPARISON:  09/27/2014.  FINDINGS: Small 5 mm long axis focus of cortical contusion is observed in the LEFT superior frontal gyrus near the vertex. There is trace adjacent subarachnoid blood as seen on image 21.  No other areas concerning for parenchymal hemorrhage, acute infarction, mass lesion, or hydrocephalus. Slight premature atrophy. Chronic cerebral ischemia with hypoattenuation of white matter. Remote LEFT basal ganglia lacunar infarct.  No skull fracture is evident.  No scalp laceration.  Carotid siphon calcification without CT signs of proximal vascular thrombosis. Negative orbits. Negative sinuses and mastoids.  Compared with prior study in January, this abnormality was not present.  IMPRESSION: Small LEFT superior frontal cortical contusion with trace adjacent subarachnoid blood. This is likely posttraumatic. There is no significant mass effect. No skull fracture is observed.  Findings discussed with ordering provider.   Electronically Signed   By: Staci Righter M.D.   On: 06/11/2015 19:16   Ct Cervical Spine Wo Contrast  06/11/2015   CLINICAL DATA:  Multiple falls. Confusion. Evaluate for cervical spine injury.  EXAM: CT CERVICAL SPINE WITHOUT CONTRAST  TECHNIQUE: Multidetector CT imaging of the  cervical spine was performed without intravenous contrast. Multiplanar CT image reconstructions were also generated.  COMPARISON:  CT of the chest from 12/12/2014.  FINDINGS: There is fusion of the C5 and C6 vertebrae which is either congenital or non-instrumented ACDF. Solid arthrodesis.  There is abnormal loss of vertebral body height at C7, with slight anterior wedging and displacement of a small osseous fragment, but no significant anterior soft tissue swelling. There may be a slight vertical fracture component to the LEFT of midline, with minimal superior endplate depression. No visible intraspinal hematoma. Multilevel facet arthropathy. Anatomic alignment. No odontoid injury.  Moderate atheromatous  and dolichoectatic change of the vasculature. No neck masses. Lung apices clear. Slight anterior wedging, but no retropulsion and no involvement of the pedicles.  The lower cervical spine is incompletely evaluated on prior CT chest, but I believe C7 is visualized and was normal at that time.  IMPRESSION: Findings consistent with a mild posttraumatic anterior wedge superior endplate compression fracture of C7. No significant retropulsion or posterior element involvement. This injury is likely stable. No visible associated intraspinal hematoma.  Findings d/w Estill Bamberg who will relay findings to the EDP.   Electronically Signed   By: Staci Righter M.D.   On: 06/11/2015 19:31   Dg Chest Port 1 View  06/12/2015   CLINICAL DATA:  Hypoxia.  EXAM: PORTABLE CHEST 1 VIEW  COMPARISON:  Earlier today.  FINDINGS: The cardiac silhouette remains mildly enlarged. Interval mild elevation of the left hemidiaphragm with stable mild left basilar atelectasis. Minimal right basilar atelectasis with improvement Otherwise, the lungs are hyperexpanded. Small amount of linear density at the left lung base. Diffuse osteopenia. Old left clavicle fracture and mildly comminuted distal right clavicle fracture with visible fracture lines on the  right. Old, healed left rib fractures. There are also mildly displaced left eighth and minimally displaced left ninth posterior rib fractures without callus formation.  IMPRESSION: 1. Acute or subacute left eighth and ninth rib fractures. 2. Acute or subacute distal right clavicle fracture. 3. Old left clavicle fracture and old left rib fractures. 4. Interval mild elevation of the left hemidiaphragm. 5. Mild left basilar atelectasis without significant change and minimal right basilar atelectasis with improvement. 6. Mild cardiomegaly and mild changes of COPD.   Electronically Signed   By: Claudie Revering M.D.   On: 06/12/2015 12:33   Dg Chest Port 1 View  06/12/2015   CLINICAL DATA:  Shortness of breath.  Acute encephalopathy.  EXAM: PORTABLE CHEST 1 VIEW  COMPARISON:  11/24/2014 chest radiograph.  FINDINGS: Stable cardiomediastinal silhouette with mild cardiomegaly. No pneumothorax. Trace bilateral pleural effusions. Mild pulmonary edema. Mild bibasilar atelectasis.  IMPRESSION: 1. Mild congestive heart failure. 2. Trace bilateral pleural effusions and mild bibasilar atelectasis.   Electronically Signed   By: Ilona Sorrel M.D.   On: 06/12/2015 07:54   Dg Knee Complete 4 Views Right  06/11/2015   CLINICAL DATA:  Abrasion and ecchymosis after falling at home multiple times today.  EXAM: RIGHT KNEE - COMPLETE 4+ VIEW  COMPARISON:  None.  FINDINGS: There is no evidence of fracture, dislocation, or joint effusion. There is no evidence of arthropathy or other focal bone abnormality. Soft tissues are unremarkable.  IMPRESSION: Negative.   Electronically Signed   By: Andreas Newport M.D.   On: 06/11/2015 20:08    Oren Binet, MD  [facility] Pager:[phone]  If 7PM-7AM, please contact night-coverage [email or web address] Password [email or web address] 06/13/2015, 2:09 PM   LOS: 2 days

## 2015-06-13 NOTE — Progress Notes (Signed)
Patient ID: Cheryl Hamilton, female   DOB: April 27, 1951, 64 y.o.   MRN: [id number] Neuro stable. Cervical collar on. Ct head this week to monitor bleeding

## 2015-06-13 NOTE — Progress Notes (Deleted)
PT Cancellation Note  Patient Details Name: Cheryl Hamilton MRN: [id number] DOB: 01/07/1951   Cancelled Treatment:    Reason Eval/Treat Not Completed: Patient declined, no reason specified.  Pt reports she just got comfortable for the first time today. She did not want to get up.  Pt is agreeable to work with PT tomorrow. Thanks,    Barbarann Ehlers. Jalan Fariss, PT, DPT [phone]   06/13/2015, 4:42 PM

## 2015-06-14 ENCOUNTER — Encounter
Admission: RE | Admit: 2015-06-14 | Discharge: 2015-06-14 | Disposition: A | Discharge status: Home / Self Care | Payer: 59 | Source: Ambulatory Visit | Attending: Internal Medicine | Admitting: Internal Medicine

## 2015-06-14 ENCOUNTER — Inpatient Hospital Stay (HOSPITAL_COMMUNITY): Payer: 59

## 2015-06-14 DIAGNOSIS — N179 Acute kidney failure, unspecified: Secondary | ICD-10-CM | POA: Diagnosis not present

## 2015-06-14 DIAGNOSIS — R609 Edema, unspecified: Secondary | ICD-10-CM

## 2015-06-14 DIAGNOSIS — I2510 Atherosclerotic heart disease of native coronary artery without angina pectoris: Secondary | ICD-10-CM

## 2015-06-14 LAB — CBC
HEMATOCRIT: 30.1 % — AB (ref 36.0–46.0)
Hemoglobin: 9.3 g/dL — ABNORMAL LOW (ref 12.0–15.0)
MCH: 24.7 pg — ABNORMAL LOW (ref 26.0–34.0)
MCHC: 30.9 g/dL (ref 30.0–36.0)
MCV: 80.1 fL (ref 78.0–100.0)
Platelets: 94 10*3/uL — ABNORMAL LOW (ref 150–400)
RBC: 3.76 MIL/uL — ABNORMAL LOW (ref 3.87–5.11)
RDW: 21.1 % — AB (ref 11.5–15.5)
WBC: 10.1 10*3/uL (ref 4.0–10.5)

## 2015-06-14 LAB — BASIC METABOLIC PANEL
ANION GAP: 11 (ref 5–15)
BUN: 23 mg/dL — ABNORMAL HIGH (ref 6–20)
CALCIUM: 8.2 mg/dL — AB (ref 8.9–10.3)
CO2: 17 mmol/L — AB (ref 22–32)
Chloride: 110 mmol/L (ref 101–111)
Creatinine, Ser: 1.23 mg/dL — ABNORMAL HIGH (ref 0.44–1.00)
GFR calc Af Amer: 53 mL/min — ABNORMAL LOW (ref >60)
GFR calc non Af Amer: 45 mL/min — ABNORMAL LOW (ref >60)
GLUCOSE: 91 mg/dL (ref 65–99)
Potassium: 4.5 mmol/L (ref 3.5–5.1)
Sodium: 138 mmol/L (ref 135–145)

## 2015-06-14 MED ORDER — TRAMADOL HCL 50 MG PO TABS: 50.0000 mg | ORAL_TABLET | Freq: Four times a day (QID) | ORAL | Status: DC | PRN

## 2015-06-14 MED ORDER — MORPHINE SULFATE (PF) 2 MG/ML IV SOLN
1.0000 mg | INTRAVENOUS | Status: DC | PRN
  Administered 2015-06-14 – 2015-06-15 (×3): 1 mg via INTRAVENOUS
  Filled 2015-06-14 (×3): qty 1

## 2015-06-14 NOTE — Progress Notes (Signed)
*  Preliminary Results* Bilateral lower extremity venous duplex completed. Bilateral lower extremities are negative for deep vein thrombosis. There is no evidence of Baker's cyst bilaterally.  06/14/2015  Maudry Mayhew, RVT, RDCS, RDMS

## 2015-06-14 NOTE — Evaluation (Signed)
Physical Therapy Evaluation Patient Details Name: Cheryl Hamilton MRN: [id number] DOB: Aug 16, 1951 Today's Date: 06/14/2015   History of Present Illness  pt presents after multiple falls at home sustaining L Superior Frontal Contusion and small SAH along with C7 fx.  pt with hx of CAD, HF, Scleroderma, CA, and Raynaud's.    Clinical Impression  Pt generally unsteady and with difficulty during cognitive tasks.  Pt admits to multiple falls since the first of the year, but she has very limited A at home as her son is disabled.  Pt will need continued therapies to maximize independence prior to returning to home.  Will continue to follow.      Follow Up Recommendations CIR    Equipment Recommendations  None recommended by PT    Recommendations for Other Services       Precautions / Restrictions Precautions Precautions: Fall;Cervical Precaution Comments: Discussed with RN ordering a different collar for pt.   Required Braces or Orthoses: Cervical Brace Cervical Brace: Hard collar;At all times Restrictions Weight Bearing Restrictions: No      Mobility  Bed Mobility Overal bed mobility: Needs Assistance Bed Mobility: Sit to Sidelying         Sit to sidelying: Mod assist General bed mobility comments: cues for technique and A for bringing LEs back to bed.  Transfers Overall transfer level: Needs assistance Equipment used: Rolling walker (2 wheeled) Transfers: Sit to/from Stand Sit to Stand: Min assist         General transfer comment: pt mildly unsteady with coming to standing and needs cues for UEs.    Ambulation/Gait Ambulation/Gait assistance: Min assist Ambulation Distance (Feet): 100 Feet Assistive device: Rolling walker (2 wheeled) Gait Pattern/deviations: Step-through pattern;Decreased stride length;Trunk flexed;Shuffle     General Gait Details: cues for safe use of RW and upright psoture.  pt easily distracted in hallway which affects balance.    Stairs             Wheelchair Mobility    Modified Rankin (Stroke Patients Only)       Balance Overall balance assessment: Needs assistance;History of Falls Sitting-balance support: No upper extremity supported;Feet supported Sitting balance-Leahy Scale: Good     Standing balance support: Single extremity supported;During functional activity Standing balance-Leahy Scale: Fair Standing balance comment: pt only able to stand without UE support for brief moment.                               Pertinent Vitals/Pain Pain Assessment: 0-10 Pain Score: 10-Worst pain ever Pain Location: L side Pain Descriptors / Indicators: Sharp;Sore Pain Intervention(s): Monitored during session;Premedicated before session;Repositioned    Home Living Family/patient expects to be discharged to:: Unsure Living Arrangements: Children (reports son is disabled) Available Help at Discharge: Family;Available PRN/intermittently Type of Home: House Home Access: Stairs to enter Entrance Stairs-Rails: Right Entrance Stairs-Number of Steps: 6-7 Home Layout: One level Home Equipment: Cane - single point;Grab bars - tub/shower;Walker - 2 wheels Additional Comments: Unsure type of RW as pt at times describing 4WW, 3WW, and 2WW.      Prior Function Level of Independence: Independent with assistive device(s)         Comments: used cane; limited cleaning     Hand Dominance        Extremity/Trunk Assessment   Upper Extremity Assessment: Defer to OT evaluation           Lower Extremity Assessment: Generalized weakness  Cervical / Trunk Assessment: Kyphotic  Communication   Communication: No difficulties  Cognition Arousal/Alertness: Awake/alert Behavior During Therapy: Anxious Overall Cognitive Status: No family/caregiver present to determine baseline cognitive functioning Area of Impairment: Attention;Memory;Following commands;Safety/judgement;Awareness;Problem solving    Current Attention Level: Alternating Memory: Decreased short-term memory Following Commands: Follows multi-step commands with increased time Safety/Judgement: Decreased awareness of safety;Decreased awareness of deficits Awareness: Emergent Problem Solving: Difficulty sequencing;Requires verbal cues;Requires tactile cues General Comments: pt often tangential in conversation and needs re-direction back on topic.      General Comments      Exercises        Assessment/Plan    PT Assessment Patient needs continued PT services  PT Diagnosis Difficulty walking;Generalized weakness;Acute pain   PT Problem List Decreased strength;Decreased activity tolerance;Decreased balance;Decreased mobility;Decreased coordination;Decreased cognition;Decreased knowledge of use of DME;Decreased safety awareness;Decreased knowledge of precautions;Pain  PT Treatment Interventions DME instruction;Gait training;Stair training;Functional mobility training;Therapeutic activities;Therapeutic exercise;Balance training;Neuromuscular re-education;Cognitive remediation;Patient/family education   PT Goals (Current goals can be found in the Care Plan section) Acute Rehab PT Goals Patient Stated Goal: not stated PT Goal Formulation: With patient Time For Goal Achievement: 06/21/15 Potential to Achieve Goals: Good    Frequency Min 3X/week   Barriers to discharge Decreased caregiver support      Co-evaluation               End of Session Equipment Utilized During Treatment: Gait belt;Cervical collar Activity Tolerance: Patient tolerated treatment well Patient left: in bed;with call bell/phone within reach;with bed alarm set Nurse Communication: Mobility status         Time: 2542-7062 PT Time Calculation (min) (ACUTE ONLY): 33 min   Charges:   PT Evaluation $Initial PT Evaluation Tier I: 1 Procedure PT Treatments $Gait Training: 8-22 mins   PT G CodesCatarina Hartshorn,  [place] 06/14/2015, 10:22 AM

## 2015-06-14 NOTE — Progress Notes (Signed)
PATIENT DETAILS Name: Cheryl Hamilton Age: 64 y.o. Sex: female Date of Birth: July 30, 1951 Admit Date: 06/11/2015 Admitting Physician Rise Patience, MD PPJ:[id number], Chrissie Noa, MD  Brief narrative: Cheryl Hamilton is a 64 y.o. female with a past medical history of coronary artery disease status post stenting, chronic systolic heart failure, scleroderma, Raynaud's disease and vaginal contents are fell at home 3 times yesterday and subsequently went to the ER at [facility]. CT scan in the ER revealed a small subarachnoid hemorrhage and contusion and C7 fracture and therefore she was transferred to [facility] for further evaluation by neurosurgery  Subjective: Awake, alert. Some pleuritic pain in the left chest. Less dizzy on standing in am dictating this morning.  Assessment/Plan: Principal Problem: Intracranial bleed (HCC)-left superior frontal contusion with trace subarachnoid bleed: Traumatic, continue to hold aspirin and Plavix-spoke with Dr. Joya Salm- Neurosurgery- okay to restart aspirin later this week,but to only restart Plavix in 2 weeks.Neurosurgery following, repeat CT head on 10/4 shows no significant worsening when compared with prior CT.  Active Problems: Nondisplaced fracture of C7 vertebra: Cervical collar in place, nonsurgical management per neurosurgery.  Multiple falls: Secondary to orthostatic hypotension. PT eval completed, recommendations are for CIR  Orthostatic hypotension: No history of vomiting, diarrhea or GI bleeding. Suspect secondary to diuretics, antihypertensives. Still orthostatic-but improving-and decrease IV fluid to 40 mL an hour. Would continue to hold diuretics/antihypertensives for now. Repeat orthostatics in a.m.   Acute renal failure: Secondary to prerenal azotemia-due to diuretics/antihypersignificantly improved with IV fluids. Follow  ? Acute hypoxic respiratory failure: Suspect a false ready-but  could be secondary to atelectasis/left rib fracture. Not short of breat nor hypoxic. Supportive care with incentive spirometry and bronchodilators. Follow and initiate further workup if only persistently hypoxic.  Anemia: Chronic microcytic anemia on initial presentation to the hospital-hemoglobin has gradually decreased. No bowel movement since admission-doubt GI bleed at this time. Suspect decrease is mostly from IV fluid dilution and acute illness. Continue to follow on monitor closely. Check FOBT and anemia panel if further drop in Hb.  Thrombocytopenia: ?Etiology-discontinue Plaquenil and Minocycline for now-follow  Chronic systolic heart failure: EF per last cardiac cath on 01/10/15 around 45%. Continue to hold diuretics, beta blocker. Continue Corlanor  Minimal troponin elevation: Suspect false positive elevation in the setting of acute renal failure. Check echocardiogram. EKG nonacute.  History of scleroderma/Raynaud's phenomena: Holding both Plaquenil and minocycline-due to worsening thrombocytopenia.  History of vaginal cancer: Outpatient follow-up with oncology.  History of CAD: Status post PCI in March 2015-given ICH-aspirin/Plavix on hold. Beta blocker on hold given hypotension/orthostatics.  Dyslipidemia: Continue fenofibrate.  Disposition: Remain inpatient-CIR tomorrow?  Antimicrobial agents  See below  Anti-infectives    Start     Dose/Rate Route Frequency Ordered Stop   06/12/15 1000  minocycline (MINOCIN,DYNACIN) capsule 100 mg     100 mg Oral 2 times daily 06/12/15 0019     06/12/15 1000  hydroxychloroquine (PLAQUENIL) tablet 200 mg     200 mg Oral 2 times daily 06/12/15 0019        DVT Prophylaxis: SCD's  Code Status: Full code   Family Communication None at bedside  Procedures: None  CONSULTS:  Neurosurgery  Time spent 25 minutes-Greater than 50% of this time was spent in counseling, explanation of diagnosis, planning of further management, and  coordination of care.  MEDICATIONS: Scheduled Meds: . fenofibrate  160  mg Oral Daily  . hydroxychloroquine  200 mg Oral BID  . ivabradine  5 mg Oral BID WC  . loratadine  10 mg Oral Daily  . minocycline  100 mg Oral BID  . pantoprazole  40 mg Oral BID  . simvastatin  40 mg Oral Daily  . triamcinolone  2 spray Nasal Daily   Continuous Infusions: . sodium chloride 40 mL/hr at 06/14/15 1125   PRN Meds:.acetaminophen **OR** acetaminophen, albuterol, bisacodyl, cyclobenzaprine, ondansetron **OR** ondansetron (ZOFRAN) IV    PHYSICAL EXAM: Vital signs in last 24 hours: Filed Vitals:   06/13/15 2147 06/14/15 0144 06/14/15 0500 06/14/15 0533  BP: 119/56 102/58  161/80  Pulse: 91 92  87  Temp: 98.5 F (36.9 C) 98.4 F (36.9 C)  98.3 F (36.8 C)  TempSrc: Oral Oral  Oral  Resp: 20 20  20   Weight:   62.234 kg (137 lb 3.2 oz)   SpO2: 100% 100%  99%    Weight change: 0.68 kg (1 lb 8 oz) Filed Weights   06/12/15 0149 06/13/15 0500 06/14/15 0500  Weight: 56.79 kg (125 lb 3.2 oz) 61.553 kg (135 lb 11.2 oz) 62.234 kg (137 lb 3.2 oz)   Body mass index is 26.8 kg/(m^2).   Gen Exam: Awake and alert with clear speech.   Neck: Cervical collar in place Chest: B/L Clear.  CVS: S1 S2 Regular, no murmurs.  Abdomen: soft, BS +, non tender, non distended.  Extremities: no edema, lower extremities warm to touch. Neurologic: Non Focal.   Skin: No Rash.   Wounds: N/A.    Intake/Output from previous day: No intake or output data in the 24 hours ending 06/14/15 1136   LAB RESULTS: CBC  Recent Labs Lab 06/11/15 1748 06/12/15 0625 06/12/15 1706 06/13/15 0401 06/14/15 0714  WBC 12.3* 8.1 13.0* 6.8 10.1  HGB 10.1* 8.8* 9.7* 8.0* 9.3*  HCT 31.9* 28.5* 32.5* 26.6* 30.1*  PLT 215 132* 173 132* 94*  MCV 76.6* 79.4 81.7 81.1 80.1  MCH 24.3* 24.5* 24.4* 24.4* 24.7*  MCHC 31.7* 30.9 29.8* 30.1 30.9  RDW 22.1* 20.7* 21.0* 21.2* 21.1*  LYMPHSABS  --  0.9  --   --   --   MONOABS  --  0.6   --   --   --   EOSABS  --  0.1  --   --   --   BASOSABS  --  0.0  --   --   --     Chemistries   Recent Labs Lab 06/11/15 1748 06/12/15 0625 06/12/15 1706 06/13/15 0401 06/14/15 0714  NA 139 139  --  141 138  K 3.5 3.0*  --  4.6 4.5  CL 103 108  --  112* 110  CO2 24 21*  --  23 17*  GLUCOSE 113* 76  --  119* 91  BUN 71* 50*  --  37* 23*  CREATININE 3.05* 2.04*  --  1.69* 1.23*  CALCIUM 8.1* 7.5*  --  7.8* 8.2*  MG  --   --  1.4* 2.2  --     CBG: No results for input(s): GLUCAP in the last 168 hours.  GFR Estimated Creatinine Clearance: 38.1 mL/min (by C-G formula based on Cr of 1.23).  Coagulation profile No results for input(s): INR, PROTIME in the last 168 hours.  Cardiac Enzymes  Recent Labs Lab 06/12/15 0100 06/12/15 0625 06/12/15 1207  TROPONINI 0.05* 0.05* 0.05*    Invalid input(s): POCBNP No results for  input(s): DDIMER in the last 72 hours. No results for input(s): HGBA1C in the last 72 hours. No results for input(s): CHOL, HDL, LDLCALC, TRIG, CHOLHDL, LDLDIRECT in the last 72 hours. No results for input(s): TSH, T4TOTAL, T3FREE, THYROIDAB in the last 72 hours.  Invalid input(s): FREET3 No results for input(s): VITAMINB12, FOLATE, FERRITIN, TIBC, IRON, RETICCTPCT in the last 72 hours. No results for input(s): LIPASE, AMYLASE in the last 72 hours.  Urine Studies No results for input(s): UHGB, CRYS in the last 72 hours.  Invalid input(s): UACOL, UAPR, USPG, UPH, UTP, UGL, UKET, UBIL, UNIT, UROB, ULEU, UEPI, UWBC, URBC, UBAC, CAST, UCOM, BILUA  MICROBIOLOGY: Recent Results (from the past 240 hour(s))  Urine culture     Status: None   Collection Time: 06/11/15  7:51 PM  Result Value Ref Range Status   Specimen Description URINE, RANDOM  Final   Special Requests NONE  Final   Culture NO GROWTH 2 DAYS  Final   Report Status 06/13/2015 FINAL  Final    RADIOLOGY STUDIES/RESULTS: Ct Head Wo Contrast  06/13/2015   CLINICAL DATA:  Followup closed  head injury.  EXAM: CT HEAD WITHOUT CONTRAST  TECHNIQUE: Contiguous axial images were obtained from the base of the skull through the vertex without intravenous contrast.  COMPARISON:  Most recent 06/11/2015.  FINDINGS: Improved LEFT superior frontal cortical contusion, with adjacent subarachnoid blood. No new areas of hemorrhage. No significant mass effect or regional edema. Stable chronic changes as previously identified with mild atrophy and small vessel disease. No skull fracture. LEFT paramedian vertex scalp calcifications stable.  IMPRESSION: Improved LEFT superior frontal cortical contusion and adjacent subarachnoid blood. No significant worsening compared with priors.   Electronically Signed   By: Staci Righter M.D.   On: 06/13/2015 13:45   Ct Head Wo Contrast  06/11/2015   CLINICAL DATA:  Multiple falls today and over the past month. Slurred speech and confusion. Scleroderma.  EXAM: CT HEAD WITHOUT CONTRAST  TECHNIQUE: Contiguous axial images were obtained from the base of the skull through the vertex without intravenous contrast.  COMPARISON:  09/27/2014.  FINDINGS: Small 5 mm long axis focus of cortical contusion is observed in the LEFT superior frontal gyrus near the vertex. There is trace adjacent subarachnoid blood as seen on image 21.  No other areas concerning for parenchymal hemorrhage, acute infarction, mass lesion, or hydrocephalus. Slight premature atrophy. Chronic cerebral ischemia with hypoattenuation of white matter. Remote LEFT basal ganglia lacunar infarct.  No skull fracture is evident.  No scalp laceration.  Carotid siphon calcification without CT signs of proximal vascular thrombosis. Negative orbits. Negative sinuses and mastoids.  Compared with prior study in January, this abnormality was not present.  IMPRESSION: Small LEFT superior frontal cortical contusion with trace adjacent subarachnoid blood. This is likely posttraumatic. There is no significant mass effect. No skull fracture  is observed.  Findings discussed with ordering provider.   Electronically Signed   By: Staci Righter M.D.   On: 06/11/2015 19:16   Ct Cervical Spine Wo Contrast  06/11/2015   CLINICAL DATA:  Multiple falls. Confusion. Evaluate for cervical spine injury.  EXAM: CT CERVICAL SPINE WITHOUT CONTRAST  TECHNIQUE: Multidetector CT imaging of the cervical spine was performed without intravenous contrast. Multiplanar CT image reconstructions were also generated.  COMPARISON:  CT of the chest from 12/12/2014.  FINDINGS: There is fusion of the C5 and C6 vertebrae which is either congenital or non-instrumented ACDF. Solid arthrodesis.  There is abnormal loss of  vertebral body height at C7, with slight anterior wedging and displacement of a small osseous fragment, but no significant anterior soft tissue swelling. There may be a slight vertical fracture component to the LEFT of midline, with minimal superior endplate depression. No visible intraspinal hematoma. Multilevel facet arthropathy. Anatomic alignment. No odontoid injury.  Moderate atheromatous and dolichoectatic change of the vasculature. No neck masses. Lung apices clear. Slight anterior wedging, but no retropulsion and no involvement of the pedicles.  The lower cervical spine is incompletely evaluated on prior CT chest, but I believe C7 is visualized and was normal at that time.  IMPRESSION: Findings consistent with a mild posttraumatic anterior wedge superior endplate compression fracture of C7. No significant retropulsion or posterior element involvement. This injury is likely stable. No visible associated intraspinal hematoma.  Findings d/w Estill Bamberg who will relay findings to the EDP.   Electronically Signed   By: Staci Righter M.D.   On: 06/11/2015 19:31   Dg Chest Port 1 View  06/12/2015   CLINICAL DATA:  Hypoxia.  EXAM: PORTABLE CHEST 1 VIEW  COMPARISON:  Earlier today.  FINDINGS: The cardiac silhouette remains mildly enlarged. Interval mild elevation of the  left hemidiaphragm with stable mild left basilar atelectasis. Minimal right basilar atelectasis with improvement Otherwise, the lungs are hyperexpanded. Small amount of linear density at the left lung base. Diffuse osteopenia. Old left clavicle fracture and mildly comminuted distal right clavicle fracture with visible fracture lines on the right. Old, healed left rib fractures. There are also mildly displaced left eighth and minimally displaced left ninth posterior rib fractures without callus formation.  IMPRESSION: 1. Acute or subacute left eighth and ninth rib fractures. 2. Acute or subacute distal right clavicle fracture. 3. Old left clavicle fracture and old left rib fractures. 4. Interval mild elevation of the left hemidiaphragm. 5. Mild left basilar atelectasis without significant change and minimal right basilar atelectasis with improvement. 6. Mild cardiomegaly and mild changes of COPD.   Electronically Signed   By: Claudie Revering M.D.   On: 06/12/2015 12:33   Dg Chest Port 1 View  06/12/2015   CLINICAL DATA:  Shortness of breath.  Acute encephalopathy.  EXAM: PORTABLE CHEST 1 VIEW  COMPARISON:  11/24/2014 chest radiograph.  FINDINGS: Stable cardiomediastinal silhouette with mild cardiomegaly. No pneumothorax. Trace bilateral pleural effusions. Mild pulmonary edema. Mild bibasilar atelectasis.  IMPRESSION: 1. Mild congestive heart failure. 2. Trace bilateral pleural effusions and mild bibasilar atelectasis.   Electronically Signed   By: Ilona Sorrel M.D.   On: 06/12/2015 07:54   Dg Knee Complete 4 Views Right  06/11/2015   CLINICAL DATA:  Abrasion and ecchymosis after falling at home multiple times today.  EXAM: RIGHT KNEE - COMPLETE 4+ VIEW  COMPARISON:  None.  FINDINGS: There is no evidence of fracture, dislocation, or joint effusion. There is no evidence of arthropathy or other focal bone abnormality. Soft tissues are unremarkable.  IMPRESSION: Negative.   Electronically Signed   By: Andreas Newport M.D.   On: 06/11/2015 20:08    Oren Binet, MD  [facility] Pager:[phone]  If 7PM-7AM, please contact night-coverage [email or web address] Password [email or web address] 06/14/2015, 11:36 AM   LOS: 3 days

## 2015-06-14 NOTE — Progress Notes (Signed)
Inpatient Rehabilitation  I met with the patient and spoke with her son [name] [facility]), [phone], regarding pt's post acute rehab needs.  Pt. does not have 24 hour supervision as her disabled son who lives in the home has a lawn care business and is gone much of the day.  Per Dr. Naaman Plummer and due to pt's  frequent falls, 24 hour supervision is recommended.  Son and pt. agreeable for SNF level post acute rehab to allow a longer rehab recovery period before return back home.  I have notified Courtney Robarge, CM, and Dysheka Bibbs, SW.  Pt. Says she wants a facility in [place] if possible.  I will sign off.  Please call if questions.  [facility] Admissions Coordinator Cell [phone] Office [phone]

## 2015-06-14 NOTE — Progress Notes (Signed)
Patient ID: Cheryl Hamilton, female   DOB: 12/03/1950, 64 y.o.   MRN: [id number] Neuro stable. Can start asa anytime and plavix in 10 days.

## 2015-06-14 NOTE — Clinical Social Work Note (Signed)
Clinical Social Work Assessment  Patient Details  Name: Cheryl Hamilton MRN: [id number] Date of Birth: 1950-11-04  Date of referral:  06/14/15               Reason for consult:  Facility Placement                Permission sought to share information with:  Family Supports Permission granted to share information::  Yes, Verbal Permission Granted  Name::     Cheryl Hamilton   Relationship::  son   Contact Information:  [phone]  Housing/Transportation Living arrangements for the past 2 months:  [place] of Information:  Patient, Adult Children Patient Interpreter Needed:  None Criminal Activity/Legal Involvement Pertinent to Current Situation/Hospitalization:  No - Comment as needed Significant Relationships:  Adult Children Lives with:  Adult Children Do you feel safe going back to the place where you live?  No Need for family participation in patient care:  Yes (Comment)  Care giving concerns: Matt reported that   Facilities manager / plan:  CSW met with the pt at the bedside. Pt asked CSW to call her son Catalina Antigua. CSW introduced self and purpose of the visit. CSW discussed SNF rehab. CSW explained the SNF process. CSW explained insurance and its relation to SNF placement. CSW emailed Matt the SNF list. CSW answered all questions in which the pt/Matt inquired about. CSW will continue to follow this pt and assist with discharge as needed.   Employment status:  Retired Nurse, adult PT Recommendations:  Inpatient [facility] / Referral to community resources:  [place]  Patient/Family's Response to care: The pt reported the care she received has been well.   Patient/Family's Understanding of and Emotional Response to Diagnosis, Current Treatment, and Prognosis: The pt acknowledged her diagnosis. Pt reported that she knows she will need additional assist prior to going home. Pt agrees to SNF.    Emotional  Assessment Appearance:  Appears stated age Attitude/Demeanor/Rapport:   (Calm ) Affect (typically observed):  Appropriate Orientation:  Oriented to Self, Oriented to Place, Oriented to Situation, Oriented to  Time Alcohol / Substance use:  Not Applicable Psych involvement (Current and /or in the community):  No (Comment)  Discharge Needs  Concerns to be addressed:  Denies Needs/Concerns at this time Readmission within the last 30 days:  No Current discharge risk:  None Barriers to Discharge:  No Barriers Identified   Shelita Steptoe, LCSW 06/14/2015, 3:14 PM

## 2015-06-14 NOTE — Clinical Social Work Placement (Signed)
   CLINICAL SOCIAL WORK PLACEMENT  NOTE  Date:  06/14/2015  Patient Details  Name: Cheryl Hamilton MRN: [id number] Date of Birth: 1950-10-01  Clinical Social Work is seeking post-discharge placement for this patient at the [place] level of care (*CSW will initial, date and re-position this form in  chart as items are completed):  Yes   Patient/family provided with [facility] Work Department's list of facilities offering this level of care within the geographic area requested by the patient (or if unable, by the patient's family).  Yes   Patient/family informed of their freedom to choose among providers that offer the needed level of care, that participate in Medicare, Medicaid or managed care program needed by the patient, have an available bed and are willing to accept the patient.  Yes   Patient/family informed of [facility]'s ownership interest in [facility] and [facility], as well as of the fact that they are under no obligation to receive care at these facilities.  PASRR submitted to EDS on       PASRR number received on       Existing PASRR number confirmed on 06/14/15     FL2 transmitted to all facilities in geographic area requested by pt/family on 06/14/15     FL2 transmitted to all facilities within larger geographic area on       Patient informed that his/her managed care company has contracts with or will negotiate with certain facilities, including the following:            Patient/family informed of bed offers received.  Patient chooses bed at       Physician recommends and patient chooses bed at      Patient to be transferred to   on  .  Patient to be transferred to facility by       Patient family notified on   of transfer.  Name of family member notified:        PHYSICIAN       Additional Comment:    _______________________________________________ Greta Doom, LCSW 06/14/2015, 3:19 PM

## 2015-06-15 LAB — BASIC METABOLIC PANEL
Anion gap: 8 (ref 5–15)
BUN: 19 mg/dL (ref 6–20)
CHLORIDE: 108 mmol/L (ref 101–111)
CO2: 23 mmol/L (ref 22–32)
CREATININE: 1.28 mg/dL — AB (ref 0.44–1.00)
Calcium: 8.2 mg/dL — ABNORMAL LOW (ref 8.9–10.3)
GFR calc Af Amer: 50 mL/min — ABNORMAL LOW (ref >60)
GFR calc non Af Amer: 43 mL/min — ABNORMAL LOW (ref >60)
Glucose, Bld: 107 mg/dL — ABNORMAL HIGH (ref 65–99)
POTASSIUM: 4.5 mmol/L (ref 3.5–5.1)
SODIUM: 139 mmol/L (ref 135–145)

## 2015-06-15 LAB — CBC
HEMATOCRIT: 28.6 % — AB (ref 36.0–46.0)
HEMOGLOBIN: 8.7 g/dL — AB (ref 12.0–15.0)
MCH: 24.9 pg — AB (ref 26.0–34.0)
MCHC: 30.4 g/dL (ref 30.0–36.0)
MCV: 81.7 fL (ref 78.0–100.0)
Platelets: 121 10*3/uL — ABNORMAL LOW (ref 150–400)
RBC: 3.5 MIL/uL — AB (ref 3.87–5.11)
RDW: 21.3 % — ABNORMAL HIGH (ref 11.5–15.5)
WBC: 12.5 10*3/uL — ABNORMAL HIGH (ref 4.0–10.5)

## 2015-06-15 MED ORDER — TRAMADOL HCL 50 MG PO TABS: 50.0000 mg | ORAL_TABLET | Freq: Four times a day (QID) | ORAL | Status: DC | PRN

## 2015-06-15 NOTE — Progress Notes (Signed)
Pt discharged to [facility] in [place]. Left unit via stretcher with PTAR at 1215pm. IV removed. Discharge instructions and prescription for Tramadol sent to SNF. Wendee Copp, RN

## 2015-06-15 NOTE — Discharge Summary (Signed)
PATIENT DETAILS Name: Cheryl Hamilton Age: 64 y.o. Sex: female Date of Birth: [birth year] MRN: [id number]. Admitting Physician: Rise Patience, MD EUM:[id number], Chrissie Noa, MD  Admit Date: 06/11/2015 Discharge date: 06/15/2015  Recommendations for Outpatient Follow-up:  1. All antihypertensives/diuretics on hold-due to orthostatics/soft blood pressure. Please resume slowly in the next few days. 2. Restarted aspirin-resume Plavix in 10 days 3. Please ensure follow-up with neurosurgery-Dr Botero 4. Please repeat CBC/BMET in 1 week-to assess thrombocytopenia, anemia and renal function 5. Keep cervical collar in place until seen by neurosurgery. 6. RIGHT ARM SLING FOR COMFORT  PRIMARY DISCHARGE DIAGNOSIS:  Principal Problem:   Intracranial bleed (HCC) Active Problems:   Raynaud disease   Chronic systolic heart failure (HCC)   Vaginal cancer (HCC)   Scleroderma (HCC)   ARF (acute renal failure) (HCC)   C7 cervical fracture (HCC)   CAD (coronary artery disease) s/p PCI.   Chronic anemia   Acute encephalopathy   Orthostatic hypotension      PAST MEDICAL HISTORY: Past Medical History  Diagnosis Date  . CHF (congestive heart failure) ([place])   . Hypertension   . Coronary artery disease   . Renal insufficiency   . Diabetes mellitus, type 2 ([place])   . Scleroderma ([place])   . Chronic back pain   . GERD (gastroesophageal reflux disease)   . Blue skin   . COPD (chronic obstructive pulmonary disease) ([place])   . Raynaud disease   . Shortness of breath dyspnea   . Anginal pain ([place])   . Edema   . Hyperlipemia   . Ovarian cancer ([place])     chemo/rad  . Vaginal cancer ([place])   . Vulvar cancer ([place])     DISCHARGE MEDICATIONS: Current Discharge Medication List    START taking these medications   Details  traMADol (ULTRAM) 50 MG tablet Take 1 tablet (50 mg total) by mouth every 6 (six) hours as needed for moderate pain. Qty: 30 tablet, Refills: 0      CONTINUE these  medications which have NOT CHANGED   Details  acetaminophen (TYLENOL) 325 MG tablet Take 325 mg by mouth every 4 (four) hours as needed.    albuterol (PROVENTIL HFA;VENTOLIN HFA) 108 (90 BASE) MCG/ACT inhaler Inhale 2 puffs into the lungs every 6 (six) hours as needed for wheezing or shortness of breath.    aspirin EC 81 MG tablet Take 81 mg by mouth daily.    benzonatate (TESSALON) 100 MG capsule Take 100 mg by mouth 3 (three) times daily as needed for cough.  Refills: 99    cetirizine (ZYRTEC) 5 MG tablet Take 5 mg by mouth daily.    cyclobenzaprine (FLEXERIL) 5 MG tablet Take 1 tablet by mouth 3 (three) times daily as needed. Refills: 1    ergocalciferol (VITAMIN D2) 50000 UNITS capsule Take 50,000 Units by mouth every Sunday.     fenofibrate (TRICOR) 145 MG tablet Take 145 mg by mouth daily.    hydroxychloroquine (PLAQUENIL) 200 MG tablet Take 200 mg by mouth 2 (two) times daily.    ivabradine (CORLANOR) 5 MG TABS tablet Take 5 mg by mouth 2 (two) times daily with a meal.    minocycline (MINOCIN,DYNACIN) 100 MG capsule Take 1 capsule by mouth 2 (two) times daily.    nystatin-triamcinolone (MYCOLOG II) cream Apply 1 application topically 2 (two) times daily as needed (for mouth).     pantoprazole (PROTONIX) 40 MG tablet Take 40 mg by mouth 2 (two) times daily.  simvastatin (ZOCOR) 40 MG tablet Take 40 mg by mouth daily. Refills: 5    triamcinolone (NASACORT) 55 MCG/ACT AERO nasal inhaler Place 2 sprays into the nose daily as needed (for allergies).       STOP taking these medications     amitriptyline (ELAVIL) 50 MG tablet      carvedilol (COREG) 25 MG tablet      clopidogrel (PLAVIX) 75 MG tablet      furosemide (LASIX) 20 MG tablet      hydrALAZINE (APRESOLINE) 25 MG tablet      KLOR-CON 10 10 MEQ tablet      losartan (COZAAR) 25 MG tablet      spironolactone (ALDACTONE) 25 MG tablet      bisacodyl (DULCOLAX) 10 MG suppository         ALLERGIES:     Allergies  Allergen Reactions  . Other Itching    Most narcotics cause itching, unsure of what kinds    BRIEF HPI:  See H&P, Labs, Consult and Test reports for all details in brief, patient is a 64 y.o. female with a past medical history of coronary artery disease status post stenting, chronic systolic heart failure, scleroderma, Raynaud's disease and vaginal contents are fell at home 3 times and subsequently went to the ER at [facility]. CT scan in the ER revealed a small subarachnoid hemorrhage and contusion and C7 fracture and therefore she was transferred to [facility] for further evaluation by neurosurgery  CONSULTATIONS:   Neurosurgery  PERTINENT RADIOLOGIC STUDIES: Ct Head Wo Contrast  06/13/2015   CLINICAL DATA:  Followup closed head injury.  EXAM: CT HEAD WITHOUT CONTRAST  TECHNIQUE: Contiguous axial images were obtained from the base of the skull through the vertex without intravenous contrast.  COMPARISON:  Most recent 06/11/2015.  FINDINGS: Improved LEFT superior frontal cortical contusion, with adjacent subarachnoid blood. No new areas of hemorrhage. No significant mass effect or regional edema. Stable chronic changes as previously identified with mild atrophy and small vessel disease. No skull fracture. LEFT paramedian vertex scalp calcifications stable.  IMPRESSION: Improved LEFT superior frontal cortical contusion and adjacent subarachnoid blood. No significant worsening compared with priors.   Electronically Signed   By: Staci Righter M.D.   On: 06/13/2015 13:45   Ct Head Wo Contrast  06/11/2015   CLINICAL DATA:  Multiple falls today and over the past month. Slurred speech and confusion. Scleroderma.  EXAM: CT HEAD WITHOUT CONTRAST  TECHNIQUE: Contiguous axial images were obtained from the base of the skull through the vertex without intravenous contrast.  COMPARISON:  09/27/2014.  FINDINGS: Small 5 mm long axis focus of cortical contusion is  observed in the LEFT superior frontal gyrus near the vertex. There is trace adjacent subarachnoid blood as seen on image 21.  No other areas concerning for parenchymal hemorrhage, acute infarction, mass lesion, or hydrocephalus. Slight premature atrophy. Chronic cerebral ischemia with hypoattenuation of white matter. Remote LEFT basal ganglia lacunar infarct.  No skull fracture is evident.  No scalp laceration.  Carotid siphon calcification without CT signs of proximal vascular thrombosis. Negative orbits. Negative sinuses and mastoids.  Compared with prior study in January, this abnormality was not present.  IMPRESSION: Small LEFT superior frontal cortical contusion with trace adjacent subarachnoid blood. This is likely posttraumatic. There is no significant mass effect. No skull fracture is observed.  Findings discussed with ordering provider.   Electronically Signed   By: Staci Righter M.D.   On: 06/11/2015  19:16   Ct Cervical Spine Wo Contrast  06/11/2015   CLINICAL DATA:  Multiple falls. Confusion. Evaluate for cervical spine injury.  EXAM: CT CERVICAL SPINE WITHOUT CONTRAST  TECHNIQUE: Multidetector CT imaging of the cervical spine was performed without intravenous contrast. Multiplanar CT image reconstructions were also generated.  COMPARISON:  CT of the chest from 12/12/2014.  FINDINGS: There is fusion of the C5 and C6 vertebrae which is either congenital or non-instrumented ACDF. Solid arthrodesis.  There is abnormal loss of vertebral body height at C7, with slight anterior wedging and displacement of a small osseous fragment, but no significant anterior soft tissue swelling. There may be a slight vertical fracture component to the LEFT of midline, with minimal superior endplate depression. No visible intraspinal hematoma. Multilevel facet arthropathy. Anatomic alignment. No odontoid injury.  Moderate atheromatous and dolichoectatic change of the vasculature. No neck masses. Lung apices clear. Slight  anterior wedging, but no retropulsion and no involvement of the pedicles.  The lower cervical spine is incompletely evaluated on prior CT chest, but I believe C7 is visualized and was normal at that time.  IMPRESSION: Findings consistent with a mild posttraumatic anterior wedge superior endplate compression fracture of C7. No significant retropulsion or posterior element involvement. This injury is likely stable. No visible associated intraspinal hematoma.  Findings d/w Estill Bamberg who will relay findings to the EDP.   Electronically Signed   By: Staci Righter M.D.   On: 06/11/2015 19:31   Dg Chest Port 1 View  06/12/2015   CLINICAL DATA:  Hypoxia.  EXAM: PORTABLE CHEST 1 VIEW  COMPARISON:  Earlier today.  FINDINGS: The cardiac silhouette remains mildly enlarged. Interval mild elevation of the left hemidiaphragm with stable mild left basilar atelectasis. Minimal right basilar atelectasis with improvement Otherwise, the lungs are hyperexpanded. Small amount of linear density at the left lung base. Diffuse osteopenia. Old left clavicle fracture and mildly comminuted distal right clavicle fracture with visible fracture lines on the right. Old, healed left rib fractures. There are also mildly displaced left eighth and minimally displaced left ninth posterior rib fractures without callus formation.  IMPRESSION: 1. Acute or subacute left eighth and ninth rib fractures. 2. Acute or subacute distal right clavicle fracture. 3. Old left clavicle fracture and old left rib fractures. 4. Interval mild elevation of the left hemidiaphragm. 5. Mild left basilar atelectasis without significant change and minimal right basilar atelectasis with improvement. 6. Mild cardiomegaly and mild changes of COPD.   Electronically Signed   By: Claudie Revering M.D.   On: 06/12/2015 12:33   Dg Chest Port 1 View  06/12/2015   CLINICAL DATA:  Shortness of breath.  Acute encephalopathy.  EXAM: PORTABLE CHEST 1 VIEW  COMPARISON:  11/24/2014 chest  radiograph.  FINDINGS: Stable cardiomediastinal silhouette with mild cardiomegaly. No pneumothorax. Trace bilateral pleural effusions. Mild pulmonary edema. Mild bibasilar atelectasis.  IMPRESSION: 1. Mild congestive heart failure. 2. Trace bilateral pleural effusions and mild bibasilar atelectasis.   Electronically Signed   By: Ilona Sorrel M.D.   On: 06/12/2015 07:54   Dg Knee Complete 4 Views Right  06/11/2015   CLINICAL DATA:  Abrasion and ecchymosis after falling at home multiple times today.  EXAM: RIGHT KNEE - COMPLETE 4+ VIEW  COMPARISON:  None.  FINDINGS: There is no evidence of fracture, dislocation, or joint effusion. There is no evidence of arthropathy or other focal bone abnormality. Soft tissues are unremarkable.  IMPRESSION: Negative.   Electronically Signed   By: Shaune Pascal  Alroy Dust M.D.   On: 06/11/2015 20:08     PERTINENT LAB RESULTS: CBC:  Recent Labs  06/14/15 0714 06/15/15 0603  WBC 10.1 12.5*  HGB 9.3* 8.7*  HCT 30.1* 28.6*  PLT 94* 121*   CMET CMP     Component Value Date/Time   NA 139 06/15/2015 0603   NA 135* 08/22/2014 1034   K 4.5 06/15/2015 0603   K 3.1* 08/22/2014 1034   CL 108 06/15/2015 0603   CL 100 08/22/2014 1034   CO2 23 06/15/2015 0603   CO2 28 08/22/2014 1034   GLUCOSE 107* 06/15/2015 0603   GLUCOSE 132* 08/22/2014 1034   BUN 19 06/15/2015 0603   BUN 14 08/22/2014 1034   CREATININE 1.28* 06/15/2015 0603   CREATININE 1.20 08/22/2014 1034   CALCIUM 8.2* 06/15/2015 0603   CALCIUM 7.1* 08/22/2014 1034   PROT 4.5* 06/12/2015 0625   PROT 7.5 11/25/2013 1624   ALBUMIN 2.1* 06/12/2015 0625   ALBUMIN 3.4 11/25/2013 1624   AST 34 06/12/2015 0625   AST 20 11/25/2013 1624   ALT 28 06/12/2015 0625   ALT 16 11/25/2013 1624   ALKPHOS 107 06/12/2015 0625   ALKPHOS 140* 11/25/2013 1624   BILITOT 0.8 06/12/2015 0625   BILITOT 0.5 11/25/2013 1624   GFRNONAA 43* 06/15/2015 0603   GFRNONAA 48* 08/22/2014 1034   GFRNONAA 51* 12/24/2013 0357   GFRAA  50* 06/15/2015 0603   GFRAA 58* 08/22/2014 1034   GFRAA 59* 12/24/2013 0357    GFR Estimated Creatinine Clearance: 36 mL/min (by C-G formula based on Cr of 1.28). No results for input(s): LIPASE, AMYLASE in the last 72 hours.  Recent Labs  06/12/15 1207  TROPONINI 0.05*   Invalid input(s): POCBNP No results for input(s): DDIMER in the last 72 hours. No results for input(s): HGBA1C in the last 72 hours. No results for input(s): CHOL, HDL, LDLCALC, TRIG, CHOLHDL, LDLDIRECT in the last 72 hours. No results for input(s): TSH, T4TOTAL, T3FREE, THYROIDAB in the last 72 hours.  Invalid input(s): FREET3 No results for input(s): VITAMINB12, FOLATE, FERRITIN, TIBC, IRON, RETICCTPCT in the last 72 hours. Coags: No results for input(s): INR in the last 72 hours.  Invalid input(s): PT Microbiology: Recent Results (from the past 240 hour(s))  Urine culture     Status: None   Collection Time: 06/11/15  7:51 PM  Result Value Ref Range Status   Specimen Description URINE, RANDOM  Final   Special Requests NONE  Final   Culture NO GROWTH 2 DAYS  Final   Report Status 06/13/2015 FINAL  Final     BRIEF HOSPITAL COURSE:   Principal Problem: Intracranial bleed (HCC)-left superior frontal contusion with trace subarachnoid bleed: Traumatic, admitted-both aspirin and Plavix were held initially. Neurosurgery was consulted,repeat CT head on 10/4 shows no significant worsening when compared with prior CT.no focal neurological deficits on exam, spoke with neurosurgery-okay to restart aspirin, but to restart Plavix in 10 days.  Active Problems: Nondisplaced fracture of C7 vertebra: Cervical collar in place, nonsurgical management per neurosurgery.please ensure follow-up with neurosurgery, and keep cervical collar in place  Multiple falls: Secondary to orthostatic hypotension. PT eval completed, recommendations were for CIR-however upon further evaluation by acute rehabilitation-felt more appropriate  for SNF.  Orthostatic hypotension: No history of vomiting, diarrhea or GI bleeding. Suspect secondary to diuretics, antihypertensives,amitriptyline. Still orthostatic-but improving.Managed by IV fluids and holding antihypertensives/diuretics. Although still somewhat orthostatic-significantly less symptomatic-claims that she was able to ambulate to the bathroomwithout dizziness with assistance.since improving-have  placed TED hose-will continue to hold antihypertensives medications, diuretics, amitriptyline-and have patient follow with primary M.D. Or M.D. at SNF for further close monitoring.Would be very cautious with starting antihypertensives/diuretics  Acute renal failure: Secondary to prerenal azotemia-due to diuretics/antihypersignificantly improved with IV fluids.please recheck electrolytes in one week while at SNF  ? Acute hypoxic respiratory failure: Suspect a false ready-but could be secondary to atelectasis/left rib fracture. Not short of breath nor hypoxic. Supportive care with incentive spirometry and bronchodilators. Plans were to Follow and initiate further workup if only persistently hypoxic-continues to be stable on room air.Lower extremity Dopplers negative for DVT.  LEFT EIGHTH AND NINTH RIB [place] with supportive care, continue incentive spirometry while at SNF.  Right distal clavicular fracture: Spoke with orthopedics-Dr. Murphy-recommended sling for comfort, follow-up with orthopedics.  Anemia: Chronic microcytic anemia on initial presentation to the hospital-hemoglobin has gradually decreased. No bowel movement since admission-doubt GI bleed at this time. Suspect decrease is mostly from IV fluid dilution and acute illness. Hemoglobin stable at 8.7, stable for further monitoring/workup in the outpatient setting.  Thrombocytopenia: ?Etiology-platelet count dropped to 94,000 on 10/5, on 10/6 improving up to 121. Although briefly discontinued Plaquenil and minocycline-suspect  that these are not the culprit medications-Will resume on discharge-Will need repeat CBC in 1 week to ensure that they could count continues to improve. If no improvement, consider referral to hematology and discontinuing minocycline/Plaquenil.  Chronic systolic heart failure: EF per last cardiac cath on 01/10/15 around 45%-But 60-65% by echo on 10/4. Continue to hold diuretics, beta blocker. Continue Corlanor  Moderate pulmonary hypertension: Seen on echocardiogram-suspect incidental finding-stable for further workup to be done in the outpatient setting  History of scleroderma/Raynauds phenomena: Continue Plaquenil and minocycline. Please see above regarding thrombocytopenia  History of vaginal cancer: Outpatient follow-up with oncology.  History of CAD: Status post PCI in March 2015-given ICH-aspirin/Plavix initially on hold-aspirin now resumed-see above for further details. Beta blocker on hold given hypotension/orthostatics.  Dyslipidemia: Continue fenofibrate.  TODAY-DAY OF DISCHARGE:  Subjective:   Shanti Agresti today has no headache,no chest abdominal pain,no new weakness tingling or numbness, feels much better wants to go home today.   Objective:   Blood pressure 98/51, pulse 94, temperature 98.5 F (36.9 C), temperature source Oral, resp. rate 20, weight 59.875 kg (132 lb), SpO2 93 %.  Intake/Output Summary (Last 24 hours) at 06/15/15 1046 Last data filed at 06/14/15 2210  Gross per 24 hour  Intake    720 ml  Output      0 ml  Net    720 ml   Filed Weights   06/13/15 0500 06/14/15 0500 06/15/15 0431  Weight: 61.553 kg (135 lb 11.2 oz) 62.234 kg (137 lb 3.2 oz) 59.875 kg (132 lb)    Exam Awake Alert, Oriented *3, No new F.N deficits, Normal affect [place].AT,PERRAL. Cervical collar in place Supple Neck,No JVD, No cervical lymphadenopathy appriciated.  Symmetrical Chest wall movement, Good air movement bilaterally, CTAB RRR,No Gallops,Rubs or new Murmurs, No Parasternal  Heave +ve B.Sounds, Abd Soft, Non tender, No organomegaly appriciated, No rebound -guarding or rigidity. No Cyanosis, Clubbing or edema, No new Rash or bruise  DISCHARGE CONDITION: Stable  DISPOSITION: SNF  DISCHARGE INSTRUCTIONS:    Activity:  As tolerated with Full fall precautions use walker/cane & assistance as needed  Get Medicines reviewed and adjusted: Please take all your medications with you for your next visit with your Primary MD  Please request your Primary MD to go over all hospital tests  and procedure/radiological results at the follow up, please ask your Primary MD to get all Hospital records sent to his/her office.  If you experience worsening of your admission symptoms, develop shortness of breath, life threatening emergency, suicidal or homicidal thoughts you must seek medical attention immediately by calling 911 or calling your MD immediately  if symptoms less severe.  You must read complete instructions/literature along with all the possible adverse reactions/side effects for all the Medicines you take and that have been prescribed to you. Take any new Medicines after you have completely understood and accpet all the possible adverse reactions/side effects.   Do not drive when taking Pain medications.   Do not take more than prescribed Pain, Sleep and Anxiety Medications  Special Instructions: If you have smoked or chewed Tobacco  in the last 2 yrs please stop smoking, stop any regular Alcohol  and or any Recreational drug use.  Wear Seat belts while driving.  Please note  You were cared for by a hospitalist during your hospital stay. Once you are discharged, your primary care physician will handle any further medical issues. Please note that NO REFILLS for any discharge medications will be authorized once you are discharged, as it is imperative that you return to your primary care physician (or establish a relationship with a primary care physician if you do not  have one) for your aftercare needs so that they can reassess your need for medications and monitor your lab values.  Diet recommendation: Heart Healthy diet  Discharge Instructions    Call MD for:  persistant nausea and vomiting    Complete by:  As directed      Call MD for:  severe uncontrolled pain    Complete by:  As directed      Diet - low sodium heart healthy    Complete by:  As directed      Increase activity slowly    Complete by:  As directed            Follow-up Information    Follow up with [facility], DALE E, MD. Schedule an appointment as soon as possible for a visit in 2 weeks.   Specialty:  Family Medicine   Contact information:   [place] [address] [phone]       Follow up with MURPHY, Ernesta Amble, MD. Schedule an appointment as soon as possible for a visit in 2 weeks.   Specialty:  Orthopedic Surgery   Contact information:   [address] [place] [id number] [phone]       Total Time spent on discharge equals  45 minutes.  SignedOren Binet 06/15/2015 10:46 AM

## 2015-06-15 NOTE — Clinical Social Work Placement (Signed)
   CLINICAL SOCIAL WORK PLACEMENT  NOTE  Date:  06/15/2015  Patient Details  Name: Cheryl Hamilton MRN: [id number] Date of Birth: 09-24-1950  Clinical Social Work is seeking post-discharge placement for this patient at the [place] level of care (*CSW will initial, date and re-position this form in  chart as items are completed):  Yes   Patient/family provided with [place] Work Department's list of facilities offering this level of care within the geographic area requested by the patient (or if unable, by the patient's family).  Yes   Patient/family informed of their freedom to choose among providers that offer the needed level of care, that participate in Medicare, Medicaid or managed care program needed by the patient, have an available bed and are willing to accept the patient.  Yes   Patient/family informed of [place]'s ownership interest in [facility] and [facility], as well as of the fact that they are under no obligation to receive care at these facilities.  PASRR submitted to EDS on       PASRR number received on       Existing PASRR number confirmed on 06/14/15     FL2 transmitted to all facilities in geographic area requested by pt/family on 06/14/15     FL2 transmitted to all facilities within larger geographic area on       Patient informed that his/her managed care company has contracts with or will negotiate with certain facilities, including the following:        Yes   Patient/family informed of bed offers received.  Patient chooses bed at [facility]     Physician recommends and patient chooses bed at      Patient to be transferred to [facility] on 06/15/15.  Patient to be transferred to facility by [facility]      Patient family notified on 06/15/15 of transfer.  Name of family member notified:  [name], son      PHYSICIAN       Additional Comment:     _______________________________________________ Greta Doom, LCSW 06/15/2015, 10:12 AM

## 2015-06-20 ENCOUNTER — Emergency Department
Admission: EM | Admit: 2015-06-20 | Discharge: 2015-06-20 | Disposition: A | Discharge status: Home / Self Care | Payer: 59 | Attending: Emergency Medicine | Admitting: Emergency Medicine

## 2015-06-20 DIAGNOSIS — R4182 Altered mental status, unspecified: Secondary | ICD-10-CM | POA: Diagnosis present

## 2015-06-20 DIAGNOSIS — Z87891 Personal history of nicotine dependence: Secondary | ICD-10-CM | POA: Diagnosis not present

## 2015-06-20 DIAGNOSIS — Z79899 Other long term (current) drug therapy: Secondary | ICD-10-CM | POA: Diagnosis not present

## 2015-06-20 DIAGNOSIS — Z7982 Long term (current) use of aspirin: Secondary | ICD-10-CM | POA: Diagnosis not present

## 2015-06-20 DIAGNOSIS — I10 Essential (primary) hypertension: Secondary | ICD-10-CM | POA: Insufficient documentation

## 2015-06-20 DIAGNOSIS — R609 Edema, unspecified: Secondary | ICD-10-CM | POA: Insufficient documentation

## 2015-06-20 DIAGNOSIS — R404 Transient alteration of awareness: Secondary | ICD-10-CM | POA: Insufficient documentation

## 2015-06-20 LAB — COMPREHENSIVE METABOLIC PANEL
ALBUMIN: 2.2 g/dL — AB (ref 3.5–5.0)
ALK PHOS: 185 U/L — AB (ref 38–126)
ALT: 48 U/L (ref 14–54)
AST: 34 U/L (ref 15–41)
Anion gap: 5 (ref 5–15)
BILIRUBIN TOTAL: 1.2 mg/dL (ref 0.3–1.2)
BUN: 21 mg/dL — AB (ref 6–20)
CO2: 27 mmol/L (ref 22–32)
CREATININE: 1.13 mg/dL — AB (ref 0.44–1.00)
Calcium: 8.4 mg/dL — ABNORMAL LOW (ref 8.9–10.3)
Chloride: 104 mmol/L (ref 101–111)
GFR calc Af Amer: 58 mL/min — ABNORMAL LOW (ref >60)
GFR calc non Af Amer: 50 mL/min — ABNORMAL LOW (ref >60)
GLUCOSE: 80 mg/dL (ref 65–99)
Potassium: 4.3 mmol/L (ref 3.5–5.1)
Sodium: 136 mmol/L (ref 135–145)
TOTAL PROTEIN: 5.4 g/dL — AB (ref 6.5–8.1)

## 2015-06-20 LAB — CBC WITH DIFFERENTIAL/PLATELET
BASOS ABS: 0.1 10*3/uL (ref 0–0.1)
Basophils Relative: 1 %
EOS PCT: 1 %
Eosinophils Absolute: 0.1 10*3/uL (ref 0–0.7)
HCT: 30.2 % — ABNORMAL LOW (ref 35.0–47.0)
Hemoglobin: 9.3 g/dL — ABNORMAL LOW (ref 12.0–16.0)
LYMPHS PCT: 8 %
Lymphs Abs: 0.9 10*3/uL — ABNORMAL LOW (ref 1.0–3.6)
MCH: 24.8 pg — ABNORMAL LOW (ref 26.0–34.0)
MCHC: 30.8 g/dL — ABNORMAL LOW (ref 32.0–36.0)
MCV: 80.5 fL (ref 80.0–100.0)
MONO ABS: 0.7 10*3/uL (ref 0.2–0.9)
Monocytes Relative: 7 %
Neutro Abs: 9.3 10*3/uL — ABNORMAL HIGH (ref 1.4–6.5)
Neutrophils Relative %: 83 %
PLATELETS: 135 10*3/uL — AB (ref 150–440)
RBC: 3.75 MIL/uL — ABNORMAL LOW (ref 3.80–5.20)
RDW: 23.6 % — AB (ref 11.5–14.5)
WBC: 11.1 10*3/uL — ABNORMAL HIGH (ref 3.6–11.0)

## 2015-06-20 NOTE — ED Notes (Signed)
AAOx3.  Skin warm and dry.  No SOB/ DOE.   

## 2015-06-20 NOTE — ED Notes (Signed)
Ems from [facility] at [place] for altered mental status, hypotension. Pt with known c7 fx

## 2015-06-20 NOTE — ED Provider Notes (Signed)
[facility] Emergency Department Provider Note  ____________________________________________  Time seen: 9:35 AM on arrival by EMS  I have reviewed the triage vital signs and the nursing notes.   HISTORY  Chief Complaint Altered Mental Status    HPI Cheryl Hamilton is a 64 y.o. female who sent to the ED from her nursing home due to altered mental status and confusion. The patient has a cervical spine fracture which is maintained and a cervical collar pending follow-up with neurosurgery and is taking opioid pain medication. This morning she went to the bathroom and fell asleep on the toilet. No fall or head injury. They found her initial blood pressure to be low at 80/50 and called EMS. EMS report that on their arrival her pupils were pinpoint. They did not give any Narcan reversal.  Currently the patient denies any complaints and states that she feels that her usual baseline.     Past Medical History  Diagnosis Date  . CHF (congestive heart failure) ([place])   . Hypertension   . Coronary artery disease   . Renal insufficiency   . Diabetes mellitus, type 2 ([place])   . Scleroderma ([place])   . Chronic back pain   . GERD (gastroesophageal reflux disease)   . Blue skin   . COPD (chronic obstructive pulmonary disease) ([place])   . Raynaud disease   . Shortness of breath dyspnea   . Anginal pain ([place])   . Edema   . Hyperlipemia   . Ovarian cancer ([place])     chemo/rad  . 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 Vulvar cancer [facility])      Patient Active Problem List   Diagnosis Date Noted  . Scleroderma ([place]) 06/12/2015  . ARF (acute renal failure) ([place]) 06/12/2015  . Intracranial bleed ([place]) 06/12/2015  . C7 cervical fracture ([place]) 06/12/2015  . CAD (coronary artery disease) s/p PCI. 06/12/2015  . Chronic anemia 06/12/2015  . Acute encephalopathy 06/12/2015  . Orthostatic hypotension 06/12/2015  . 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 Raynaud disease 12/28/2014  . Chronic  systolic heart failure ([place]) 12/28/2014     Past Surgical History  Procedure Laterality Date  . Back surgery  1990  . Vulva surgery    . Nasal sinus surgery    . Cervical fusion    . Abdominal hysterectomy    . Cardiac catheterization  12/23/2013  . Coronary angioplasty with stent placement  12/23/2013  . Cardiac catheterization N/A 01/10/2015    Procedure: Left Heart Cath and Coronary Angiography;  Surgeon: Dionisio David, MD;  Location: [facility];  Service: Cardiovascular;  Laterality: N/A;  . Breast biopsy Left 01/15/12    neg  . Breast biopsy Left     neg bx/clip  . Breast biopsy Right     neg-bx/clip     Current Outpatient Rx  Name  Route  Sig  Dispense  Refill  . acetaminophen (TYLENOL) 325 MG tablet   Oral   Take 650 mg by mouth 4 (four) times daily.          Marland Kitchen acetaminophen (TYLENOL) 325 MG tablet   Oral   Take 325 mg by mouth every 4 (four) hours as needed for mild pain, moderate pain or fever.         Marland Kitchen albuterol (PROVENTIL HFA;VENTOLIN HFA) 108 (90 BASE) MCG/ACT inhaler   Inhalation   Inhale 2 puffs into the lungs every 6 (six) hours as needed for wheezing or shortness of breath.         Marland Kitchen  aspirin EC 81 MG tablet   Oral   Take 81 mg by mouth daily.         . benzonatate (TESSALON) 100 MG capsule   Oral   Take 100 mg by mouth 3 (three) times daily as needed for cough.       99   . carvedilol (COREG) 25 MG tablet   Oral   Take 25 mg by mouth 2 (two) times daily with a meal.         . cetirizine (ZYRTEC) 5 MG tablet   Oral   Take 5 mg by mouth daily.         . cyclobenzaprine (FLEXERIL) 5 MG tablet   Oral   Take 1 tablet by mouth 3 (three) times daily as needed for muscle spasms.       1   . fenofibrate (TRICOR) 145 MG tablet   Oral   Take 145 mg by mouth daily.         . hydroxychloroquine (PLAQUENIL) 200 MG tablet   Oral   Take 200 mg by mouth 2 (two) times daily.         . ivabradine (CORLANOR) 5 MG TABS tablet    Oral   Take 5 mg by mouth 2 (two) times daily with a meal.         . Lidocaine, Anorectal, 5 % CREA   Topical   Apply 1 application topically 3 (three) times daily as needed (pain). Apply a thin film to tender cuticle on right index finger         . minocycline (MINOCIN,DYNACIN) 100 MG capsule   Oral   Take 1 capsule by mouth 2 (two) times daily.         Marland Kitchen morphine (MSIR) 15 MG tablet   Oral   Take 15 mg by mouth every 4 (four) hours as needed for severe pain.         Marland Kitchen nystatin-triamcinolone (MYCOLOG II) cream   Topical   Apply 1 application topically 2 (two) times daily as needed (mouth).          . ondansetron (ZOFRAN) 4 MG tablet   Oral   Take 4 mg by mouth every 6 (six) hours as needed for nausea or vomiting.         . pantoprazole (PROTONIX) 40 MG tablet   Oral   Take 40 mg by mouth 2 (two) times daily.          . simvastatin (ZOCOR) 40 MG tablet   Oral   Take 40 mg by mouth every morning.       5   . traMADol (ULTRAM) 50 MG tablet   Oral   Take 1 tablet (50 mg total) by mouth every 6 (six) hours as needed for moderate pain.   30 tablet   0   . triamcinolone (NASACORT) 55 MCG/ACT AERO nasal inhaler   Nasal   Place 2 sprays into the nose daily as needed (allergies).          . ergocalciferol (VITAMIN D2) 50000 UNITS capsule   Oral   Take 50,000 Units by mouth every Sunday.             Allergies Other   Family History  Problem Relation Age of Onset  . Hypertension Other     Social History Social History  Substance Use Topics  . Smoking status: Former Smoker    Types: Cigarettes    Quit date: 07/12/2013  .  Smokeless tobacco: Not on file  . Alcohol Use: No    Review of Systems  Constitutional:   No fever or chills. No weight changes Eyes:   No blurry vision or double vision.  ENT:   No sore throat. Cardiovascular:   No chest pain. Respiratory:   No dyspnea or cough. Gastrointestinal:   Negative for abdominal pain,  vomiting and diarrhea.  No BRBPR or melena. Genitourinary:   Negative for dysuria, urinary retention, bloody urine, or difficulty urinating. Musculoskeletal:   Negative for back pain. No joint swelling or pain.chronic neck pain from recent injury Skin:   Negative for rash. Neurological:   Negative for headaches, focal weakness or numbness. Psychiatric:  No anxiety or depression.   Endocrine:  No hot/cold intolerance, changes in energy, or sleep difficulty.  10-point ROS otherwise negative.  ____________________________________________   PHYSICAL EXAM:  VITAL SIGNS: ED Triage Vitals  Enc Vitals Group     BP 06/20/15 1000 122/69 mmHg     Pulse Rate 06/20/15 1000 72     Resp 06/20/15 1000 7     Temp 06/20/15 1005 98 F (36.7 C)     Temp Source 06/20/15 1005 Oral     SpO2 06/20/15 1000 93 %     Weight 06/20/15 1005 130 lb (58.968 kg)     Height 06/20/15 1005 5' (1.524 m)     Head Cir --      Peak Flow --      Pain Score 06/20/15 1007 4     Pain Loc --      Pain Edu? --      Excl. in [place]? --      Constitutional:   Alert and oriented4. Knows where she lives and who the president is.. Well appearing and in no distress. Eyes:   No scleral icterus. No conjunctival pallor. PERRL. EOMI ENT   Head:   Normocephalic and atraumatic.   Nose:   No congestion/rhinnorhea. No septal hematoma   Mouth/Throat:   MMM, no pharyngeal erythema. No peritonsillar mass. No uvula shift.   Neck:   No stridor. No SubQ emphysema. No meningismus. Hematological/Lymphatic/Immunilogical:   No cervical lymphadenopathy. Cardiovascular:   RRR. Normal and symmetric distal pulses are present in all extremities. No murmurs, rubs, or gallops. Respiratory:   Normal respiratory effort without tachypnea nor retractions. Breath sounds are clear and equal bilaterally. No wheezes/rales/rhonchi. Gastrointestinal:   Soft and nontender. No distention. There is no CVA tenderness.  No rebound, rigidity, or  guarding. Genitourinary:   deferred Musculoskeletal:   Nontender with normal range of motion in all extremities. No joint effusions.  No lower extremity tenderness.  1+ pitting edema bilateral lower extremities. Neurologic:   Normal speech and language.  CN 2-10 normal. Motor grossly intact. No pronator drift.  Normal gait. No gross focal neurologic deficits are appreciated.  Skin:    Skin is warm, dry and intact. No rash noted.  No petechiae, purpura, or bullae.grayish blue discoloration of the skin diffusely which the patient reports is due to her scleroderma Psychiatric:   Mood and affect are normal. Speech and behavior are normal. Patient exhibits appropriate insight and judgment.  ____________________________________________    LABS (pertinent positives/negatives) (all labs ordered are listed, but only abnormal results are displayed) Labs Reviewed  COMPREHENSIVE METABOLIC PANEL - Abnormal; Notable for the following:    BUN 21 (*)    Creatinine, Ser 1.13 (*)    Calcium 8.4 (*)    Total Protein 5.4 (*)  Albumin 2.2 (*)    Alkaline Phosphatase 185 (*)    GFR calc non Af Amer 50 (*)    GFR calc Af Amer 58 (*)    All other components within normal limits  CBC WITH DIFFERENTIAL/PLATELET - Abnormal; Notable for the following:    WBC 11.1 (*)    RBC 3.75 (*)    Hemoglobin 9.3 (*)    HCT 30.2 (*)    MCH 24.8 (*)    MCHC 30.8 (*)    RDW 23.6 (*)    Platelets 135 (*)    Neutro Abs 9.3 (*)    Lymphs Abs 0.9 (*)    All other components within normal limits   ____________________________________________   EKG  Interpreted by me Normal sinus rhythm rate of 72. Normal axis intervals QRS and ST segments and T waves.  ____________________________________________    RADIOLOGY    ____________________________________________   PROCEDURES   ____________________________________________   INITIAL IMPRESSION / ASSESSMENT AND PLAN / ED COURSE  Pertinent labs & imaging  results that were available during my care of the patient were reviewed by me and considered in my medical decision making (see chart for details).  Patient brought to emergency room for an episode of confusion which has resolved prior to arrival. Based on history and current physical exam, this is most likely due to opioid intoxication which has since resolved spontaneously. The patient is well-appearing and not in distress. We checked some screening labs and EKG which were unremarkable. We'll discharge her home and have her follow up with primary care as usual.     ____________________________________________   FINAL CLINICAL IMPRESSION(S) / ED DIAGNOSES  Final diagnoses:  Transient alteration of awareness      Carrie Mew, MD 06/20/15 1224

## 2015-06-20 NOTE — Discharge Instructions (Signed)
You were seen in the emergency room today for confusion. This is likely due to your pain medication. By the time you arrived in the emergency room, you had return to normal. Your blood tests today were unremarkable. Please continue to follow up with your primary care doctor as usual.

## 2015-06-20 NOTE — ED Notes (Signed)
Pt with hard collar in place, upper and lower body with resolving bruises of various ages, lower ext +2 edema, shallow breath sounds, a/o, normotensive

## 2015-06-21 ENCOUNTER — Other Ambulatory Visit: Payer: Self-pay | Admitting: Internal Medicine

## 2015-06-21 DIAGNOSIS — R4182 Altered mental status, unspecified: Secondary | ICD-10-CM

## 2015-06-21 DIAGNOSIS — S0990XA Unspecified injury of head, initial encounter: Secondary | ICD-10-CM

## 2015-06-21 DIAGNOSIS — R410 Disorientation, unspecified: Secondary | ICD-10-CM

## 2015-06-22 DIAGNOSIS — N179 Acute kidney failure, unspecified: Secondary | ICD-10-CM | POA: Diagnosis present

## 2015-06-22 LAB — BASIC METABOLIC PANEL
Anion gap: 8 (ref 5–15)
BUN: 20 mg/dL (ref 6–20)
CO2: 25 mmol/L (ref 22–32)
Calcium: 8.4 mg/dL — ABNORMAL LOW (ref 8.9–10.3)
Chloride: 104 mmol/L (ref 101–111)
Creatinine, Ser: 1.1 mg/dL — ABNORMAL HIGH (ref 0.44–1.00)
GFR calc Af Amer: >60 mL/min (ref >60)
GFR, EST NON AFRICAN AMERICAN: 52 mL/min — AB (ref >60)
GLUCOSE: 148 mg/dL — AB (ref 65–99)
POTASSIUM: 3.9 mmol/L (ref 3.5–5.1)
Sodium: 137 mmol/L (ref 135–145)

## 2015-06-22 LAB — CBC WITH DIFFERENTIAL/PLATELET
Basophils Absolute: 0 10*3/uL (ref 0–0.1)
Basophils Relative: 0 %
EOS PCT: 1 %
Eosinophils Absolute: 0.1 10*3/uL (ref 0–0.7)
HCT: 30.7 % — ABNORMAL LOW (ref 35.0–47.0)
Hemoglobin: 9.5 g/dL — ABNORMAL LOW (ref 12.0–16.0)
LYMPHS ABS: 0.8 10*3/uL — AB (ref 1.0–3.6)
LYMPHS PCT: 10 %
MCH: 25.5 pg — AB (ref 26.0–34.0)
MCHC: 30.8 g/dL — ABNORMAL LOW (ref 32.0–36.0)
MCV: 82.9 fL (ref 80.0–100.0)
MONO ABS: 0.5 10*3/uL (ref 0.2–0.9)
Monocytes Relative: 6 %
Neutro Abs: 6.4 10*3/uL (ref 1.4–6.5)
Neutrophils Relative %: 83 %
PLATELETS: 176 10*3/uL (ref 150–440)
RBC: 3.7 MIL/uL — AB (ref 3.80–5.20)
RDW: 25 % — AB (ref 11.5–14.5)
WBC: 7.7 10*3/uL (ref 3.6–11.0)

## 2015-06-26 ENCOUNTER — Ambulatory Visit
Admission: RE | Admit: 2015-06-26 | Discharge: 2015-06-26 | Disposition: A | Discharge status: Home / Self Care | Payer: 59 | Source: Ambulatory Visit | Attending: Internal Medicine | Admitting: Internal Medicine

## 2015-06-26 DIAGNOSIS — G319 Degenerative disease of nervous system, unspecified: Secondary | ICD-10-CM | POA: Diagnosis not present

## 2015-06-26 DIAGNOSIS — I7090 Unspecified atherosclerosis: Secondary | ICD-10-CM | POA: Diagnosis not present

## 2015-06-26 DIAGNOSIS — S0990XA Unspecified injury of head, initial encounter: Secondary | ICD-10-CM | POA: Insufficient documentation

## 2015-06-26 DIAGNOSIS — X58XXXA Exposure to other specified factors, initial encounter: Secondary | ICD-10-CM | POA: Diagnosis not present

## 2015-06-26 DIAGNOSIS — R410 Disorientation, unspecified: Secondary | ICD-10-CM

## 2015-06-26 DIAGNOSIS — R4182 Altered mental status, unspecified: Secondary | ICD-10-CM | POA: Insufficient documentation

## 2015-06-30 DIAGNOSIS — N179 Acute kidney failure, unspecified: Secondary | ICD-10-CM | POA: Diagnosis not present

## 2015-07-01 LAB — GLUCOSE, CAPILLARY
GLUCOSE-CAPILLARY: 160 mg/dL — AB (ref 65–99)
Glucose-Capillary: 83 mg/dL (ref 65–99)

## 2015-07-11 ENCOUNTER — Encounter
Admission: RE | Admit: 2015-07-11 | Discharge: 2015-07-11 | Disposition: A | Discharge status: Home / Self Care | Payer: 59 | Source: Ambulatory Visit | Attending: Internal Medicine | Admitting: Internal Medicine

## 2015-09-07 ENCOUNTER — Emergency Department: Payer: Medicare Other

## 2015-09-07 ENCOUNTER — Inpatient Hospital Stay
Admission: EM | Admit: 2015-09-07 | Discharge: 2015-09-10 | DRG: 291 | Disposition: A | Discharge status: Home / Self Care | Payer: Medicare Other | Attending: Internal Medicine | Admitting: Internal Medicine

## 2015-09-07 ENCOUNTER — Inpatient Hospital Stay
Admit: 2015-09-07 | Discharge: 2015-09-07 | Disposition: A | Discharge status: Home / Self Care | Payer: Medicare Other | Attending: Internal Medicine | Admitting: Internal Medicine

## 2015-09-07 DIAGNOSIS — Z888 Allergy status to other drugs, medicaments and biological substances status: Secondary | ICD-10-CM | POA: Diagnosis not present

## 2015-09-07 DIAGNOSIS — I248 Other forms of acute ischemic heart disease: Secondary | ICD-10-CM | POA: Diagnosis present

## 2015-09-07 DIAGNOSIS — I509 Heart failure, unspecified: Secondary | ICD-10-CM | POA: Diagnosis not present

## 2015-09-07 DIAGNOSIS — Z7982 Long term (current) use of aspirin: Secondary | ICD-10-CM

## 2015-09-07 DIAGNOSIS — I5043 Acute on chronic combined systolic (congestive) and diastolic (congestive) heart failure: Secondary | ICD-10-CM | POA: Diagnosis present

## 2015-09-07 DIAGNOSIS — Z955 Presence of coronary angioplasty implant and graft: Secondary | ICD-10-CM

## 2015-09-07 DIAGNOSIS — Z87891 Personal history of nicotine dependence: Secondary | ICD-10-CM | POA: Diagnosis not present

## 2015-09-07 DIAGNOSIS — J9601 Acute respiratory failure with hypoxia: Secondary | ICD-10-CM | POA: Diagnosis present

## 2015-09-07 DIAGNOSIS — R0902 Hypoxemia: Secondary | ICD-10-CM

## 2015-09-07 DIAGNOSIS — J440 Chronic obstructive pulmonary disease with acute lower respiratory infection: Secondary | ICD-10-CM | POA: Diagnosis present

## 2015-09-07 DIAGNOSIS — M549 Dorsalgia, unspecified: Secondary | ICD-10-CM | POA: Diagnosis present

## 2015-09-07 DIAGNOSIS — R778 Other specified abnormalities of plasma proteins: Secondary | ICD-10-CM

## 2015-09-07 DIAGNOSIS — E119 Type 2 diabetes mellitus without complications: Secondary | ICD-10-CM | POA: Diagnosis present

## 2015-09-07 DIAGNOSIS — E785 Hyperlipidemia, unspecified: Secondary | ICD-10-CM | POA: Diagnosis present

## 2015-09-07 DIAGNOSIS — I2510 Atherosclerotic heart disease of native coronary artery without angina pectoris: Secondary | ICD-10-CM | POA: Diagnosis present

## 2015-09-07 DIAGNOSIS — Z8543 Personal history of malignant neoplasm of ovary: Secondary | ICD-10-CM | POA: Diagnosis not present

## 2015-09-07 DIAGNOSIS — Z79899 Other long term (current) drug therapy: Secondary | ICD-10-CM

## 2015-09-07 DIAGNOSIS — E876 Hypokalemia: Secondary | ICD-10-CM | POA: Diagnosis present

## 2015-09-07 DIAGNOSIS — Z7902 Long term (current) use of antithrombotics/antiplatelets: Secondary | ICD-10-CM | POA: Diagnosis not present

## 2015-09-07 DIAGNOSIS — Z23 Encounter for immunization: Secondary | ICD-10-CM

## 2015-09-07 DIAGNOSIS — G8929 Other chronic pain: Secondary | ICD-10-CM | POA: Diagnosis present

## 2015-09-07 DIAGNOSIS — Z9861 Coronary angioplasty status: Secondary | ICD-10-CM | POA: Diagnosis not present

## 2015-09-07 DIAGNOSIS — K219 Gastro-esophageal reflux disease without esophagitis: Secondary | ICD-10-CM | POA: Diagnosis present

## 2015-09-07 DIAGNOSIS — J441 Chronic obstructive pulmonary disease with (acute) exacerbation: Secondary | ICD-10-CM | POA: Diagnosis not present

## 2015-09-07 DIAGNOSIS — I110 Hypertensive heart disease with heart failure: Principal | ICD-10-CM | POA: Diagnosis present

## 2015-09-07 DIAGNOSIS — R0600 Dyspnea, unspecified: Secondary | ICD-10-CM | POA: Diagnosis present

## 2015-09-07 DIAGNOSIS — J209 Acute bronchitis, unspecified: Secondary | ICD-10-CM | POA: Diagnosis not present

## 2015-09-07 DIAGNOSIS — M349 Systemic sclerosis, unspecified: Secondary | ICD-10-CM | POA: Diagnosis present

## 2015-09-07 DIAGNOSIS — Z8544 Personal history of malignant neoplasm of other female genital organs: Secondary | ICD-10-CM

## 2015-09-07 DIAGNOSIS — I7300 Raynaud's syndrome without gangrene: Secondary | ICD-10-CM | POA: Diagnosis present

## 2015-09-07 DIAGNOSIS — R7989 Other specified abnormal findings of blood chemistry: Secondary | ICD-10-CM

## 2015-09-07 DIAGNOSIS — Z79891 Long term (current) use of opiate analgesic: Secondary | ICD-10-CM | POA: Diagnosis not present

## 2015-09-07 DIAGNOSIS — R000 Tachycardia, unspecified: Secondary | ICD-10-CM

## 2015-09-07 LAB — BLOOD GAS, ARTERIAL
ACID-BASE EXCESS: 0.7 mmol/L (ref 0.0–3.0)
Bicarbonate: 22.9 mEq/L (ref 21.0–28.0)
FIO2: 28
O2 SAT: 95 %
PCO2 ART: 28 mmHg — AB (ref 32.0–48.0)
PO2 ART: 67 mmHg — AB (ref 83.0–108.0)
Patient temperature: 37
pH, Arterial: 7.52 — ABNORMAL HIGH (ref 7.350–7.450)

## 2015-09-07 LAB — INFLUENZA PANEL BY PCR (TYPE A & B)
H1N1FLUPCR: NOT DETECTED
INFLAPCR: NEGATIVE
INFLBPCR: NEGATIVE

## 2015-09-07 LAB — CBC WITH DIFFERENTIAL/PLATELET
BASOS ABS: 0 10*3/uL (ref 0–0.1)
Basophils Relative: 0 %
EOS ABS: 0 10*3/uL (ref 0–0.7)
EOS PCT: 0 %
HCT: 35.3 % (ref 35.0–47.0)
Hemoglobin: 11 g/dL — ABNORMAL LOW (ref 12.0–16.0)
LYMPHS PCT: 8 %
Lymphs Abs: 0.9 10*3/uL — ABNORMAL LOW (ref 1.0–3.6)
MCH: 23.9 pg — AB (ref 26.0–34.0)
MCHC: 31.3 g/dL — ABNORMAL LOW (ref 32.0–36.0)
MCV: 76.3 fL — AB (ref 80.0–100.0)
MONO ABS: 0.8 10*3/uL (ref 0.2–0.9)
Monocytes Relative: 7 %
Neutro Abs: 9.8 10*3/uL — ABNORMAL HIGH (ref 1.4–6.5)
Neutrophils Relative %: 85 %
PLATELETS: 274 10*3/uL (ref 150–440)
RBC: 4.62 MIL/uL (ref 3.80–5.20)
RDW: 19.1 % — AB (ref 11.5–14.5)
WBC: 11.5 10*3/uL — ABNORMAL HIGH (ref 3.6–11.0)

## 2015-09-07 LAB — PROTIME-INR
INR: 1.37
Prothrombin Time: 17 seconds — ABNORMAL HIGH (ref 11.4–15.0)

## 2015-09-07 LAB — COMPREHENSIVE METABOLIC PANEL
ALBUMIN: 2.8 g/dL — AB (ref 3.5–5.0)
ALT: 23 U/L (ref 14–54)
ANION GAP: 12 (ref 5–15)
AST: 26 U/L (ref 15–41)
Alkaline Phosphatase: 167 U/L — ABNORMAL HIGH (ref 38–126)
BUN: 8 mg/dL (ref 6–20)
CO2: 23 mmol/L (ref 22–32)
Calcium: 6.5 mg/dL — ABNORMAL LOW (ref 8.9–10.3)
Chloride: 102 mmol/L (ref 101–111)
Creatinine, Ser: 0.8 mg/dL (ref 0.44–1.00)
GFR calc non Af Amer: >60 mL/min (ref >60)
Glucose, Bld: 251 mg/dL — ABNORMAL HIGH (ref 65–99)
POTASSIUM: 2.8 mmol/L — AB (ref 3.5–5.1)
SODIUM: 137 mmol/L (ref 135–145)
TOTAL PROTEIN: 6.9 g/dL (ref 6.5–8.1)
Total Bilirubin: 0.8 mg/dL (ref 0.3–1.2)

## 2015-09-07 LAB — LACTIC ACID, PLASMA
LACTIC ACID, VENOUS: 2 mmol/L (ref 0.5–2.0)
Lactic Acid, Venous: 2.4 mmol/L (ref 0.5–2.0)

## 2015-09-07 LAB — MRSA PCR SCREENING: MRSA by PCR: NEGATIVE

## 2015-09-07 LAB — GLUCOSE, CAPILLARY
GLUCOSE-CAPILLARY: 117 mg/dL — AB (ref 65–99)
Glucose-Capillary: 217 mg/dL — ABNORMAL HIGH (ref 65–99)
Glucose-Capillary: 153 mg/dL — ABNORMAL HIGH (ref 65–99)

## 2015-09-07 LAB — TROPONIN I
TROPONIN I: 0.29 ng/mL — AB (ref <0.031)
TROPONIN I: 0.44 ng/mL — AB (ref <0.031)
TROPONIN I: 0.5 ng/mL — AB (ref <0.031)

## 2015-09-07 LAB — BRAIN NATRIURETIC PEPTIDE

## 2015-09-07 LAB — APTT: APTT: 32 s (ref 24–36)

## 2015-09-07 MED ORDER — ONDANSETRON HCL 4 MG/2ML IJ SOLN: 4.0000 mg | Freq: Four times a day (QID) | INTRAMUSCULAR | Status: DC | PRN

## 2015-09-07 MED ORDER — HEPARIN (PORCINE) IN NACL 100-0.45 UNIT/ML-% IJ SOLN: 10.0000 [IU]/kg/h | Freq: Once | INTRAMUSCULAR | Status: DC

## 2015-09-07 MED ORDER — FUROSEMIDE 10 MG/ML IJ SOLN
40.0000 mg | Freq: Once | INTRAMUSCULAR | Status: AC
  Administered 2015-09-07: 40 mg via INTRAVENOUS
  Filled 2015-09-07: qty 4

## 2015-09-07 MED ORDER — LEVOFLOXACIN IN D5W 750 MG/150ML IV SOLN
750.0000 mg | INTRAVENOUS | Status: DC
  Administered 2015-09-08: 750 mg via INTRAVENOUS
  Filled 2015-09-07 (×2): qty 150

## 2015-09-07 MED ORDER — NITROGLYCERIN 2 % TD OINT
TOPICAL_OINTMENT | TRANSDERMAL | Status: AC
  Administered 2015-09-07: 0.5 [in_us] via TOPICAL
  Filled 2015-09-07: qty 1

## 2015-09-07 MED ORDER — ONDANSETRON HCL 4 MG PO TABS: 4.0000 mg | ORAL_TABLET | Freq: Four times a day (QID) | ORAL | Status: DC | PRN

## 2015-09-07 MED ORDER — SODIUM CHLORIDE 0.9 % IJ SOLN
3.0000 mL | Freq: Two times a day (BID) | INTRAMUSCULAR | Status: DC
  Administered 2015-09-07 – 2015-09-09 (×4): 3 mL via INTRAVENOUS

## 2015-09-07 MED ORDER — METOPROLOL TARTRATE 1 MG/ML IV SOLN
2.5000 mg | Freq: Once | INTRAVENOUS | Status: AC
  Administered 2015-09-07: 2.5 mg via INTRAVENOUS

## 2015-09-07 MED ORDER — INSULIN ASPART 100 UNIT/ML ~~LOC~~ SOLN
0.0000 [IU] | Freq: Three times a day (TID) | SUBCUTANEOUS | Status: DC
  Administered 2015-09-07: 5 [IU] via SUBCUTANEOUS
  Administered 2015-09-08: 2 [IU] via SUBCUTANEOUS
  Administered 2015-09-08: 3 [IU] via SUBCUTANEOUS
  Administered 2015-09-08: 2 [IU] via SUBCUTANEOUS
  Administered 2015-09-09: 8 [IU] via SUBCUTANEOUS
  Administered 2015-09-09: 5 [IU] via SUBCUTANEOUS
  Administered 2015-09-10 (×2): 3 [IU] via SUBCUTANEOUS
  Filled 2015-09-07 (×2): qty 3
  Filled 2015-09-07 (×2): qty 2
  Filled 2015-09-07: qty 3
  Filled 2015-09-07: qty 8
  Filled 2015-09-07: qty 5

## 2015-09-07 MED ORDER — GUAIFENESIN-DM 100-10 MG/5ML PO SYRP: 5.0000 mL | ORAL_SOLUTION | ORAL | Status: DC | PRN

## 2015-09-07 MED ORDER — IVABRADINE HCL 5 MG PO TABS
5.0000 mg | ORAL_TABLET | Freq: Two times a day (BID) | ORAL | Status: DC
  Administered 2015-09-07 – 2015-09-10 (×6): 5 mg via ORAL
  Filled 2015-09-07 (×9): qty 1

## 2015-09-07 MED ORDER — IPRATROPIUM-ALBUTEROL 0.5-2.5 (3) MG/3ML IN SOLN
3.0000 mL | Freq: Once | RESPIRATORY_TRACT | Status: AC
  Administered 2015-09-07: 3 mL via RESPIRATORY_TRACT

## 2015-09-07 MED ORDER — SODIUM CHLORIDE 0.9 % IV SOLN: 250.0000 mL | INTRAVENOUS | Status: DC | PRN

## 2015-09-07 MED ORDER — SODIUM CHLORIDE 0.9 % IJ SOLN
3.0000 mL | Freq: Two times a day (BID) | INTRAMUSCULAR | Status: DC
  Administered 2015-09-07 – 2015-09-10 (×7): 3 mL via INTRAVENOUS

## 2015-09-07 MED ORDER — METOPROLOL TARTRATE 1 MG/ML IV SOLN
INTRAVENOUS | Status: AC
  Administered 2015-09-07: 2.5 mg via INTRAVENOUS
  Filled 2015-09-07: qty 5

## 2015-09-07 MED ORDER — ASPIRIN 81 MG PO CHEW
324.0000 mg | CHEWABLE_TABLET | Freq: Once | ORAL | Status: AC
  Administered 2015-09-07: 324 mg via ORAL

## 2015-09-07 MED ORDER — FUROSEMIDE 10 MG/ML IJ SOLN
40.0000 mg | Freq: Two times a day (BID) | INTRAMUSCULAR | Status: DC
  Administered 2015-09-07 – 2015-09-10 (×6): 40 mg via INTRAVENOUS
  Filled 2015-09-07 (×6): qty 4

## 2015-09-07 MED ORDER — SODIUM CHLORIDE 0.9 % IJ SOLN: 3.0000 mL | INTRAMUSCULAR | Status: DC | PRN

## 2015-09-07 MED ORDER — PANTOPRAZOLE SODIUM 40 MG PO TBEC
40.0000 mg | DELAYED_RELEASE_TABLET | Freq: Two times a day (BID) | ORAL | Status: DC
  Administered 2015-09-07 – 2015-09-10 (×7): 40 mg via ORAL
  Filled 2015-09-07 (×7): qty 1

## 2015-09-07 MED ORDER — POTASSIUM CHLORIDE 20 MEQ/15ML (10%) PO SOLN
40.0000 meq | ORAL | Status: AC
  Administered 2015-09-07 (×2): 40 meq via ORAL
  Filled 2015-09-07 (×4): qty 30

## 2015-09-07 MED ORDER — NITROGLYCERIN 2 % TD OINT
0.5000 [in_us] | TOPICAL_OINTMENT | Freq: Once | TRANSDERMAL | Status: AC
  Administered 2015-09-07: 0.5 [in_us] via TOPICAL

## 2015-09-07 MED ORDER — DILTIAZEM HCL 25 MG/5ML IV SOLN: 10.0000 mg | INTRAVENOUS | Status: DC | PRN

## 2015-09-07 MED ORDER — ACETAMINOPHEN 325 MG PO TABS
650.0000 mg | ORAL_TABLET | Freq: Four times a day (QID) | ORAL | Status: DC | PRN
Start: 2015-09-07 — End: 2015-09-10
  Administered 2015-09-08: 650 mg via ORAL
  Filled 2015-09-07: qty 2

## 2015-09-07 MED ORDER — SIMVASTATIN 40 MG PO TABS
40.0000 mg | ORAL_TABLET | ORAL | Status: DC
  Administered 2015-09-08 – 2015-09-10 (×3): 40 mg via ORAL
  Filled 2015-09-07 (×4): qty 1

## 2015-09-07 MED ORDER — ASPIRIN 81 MG PO CHEW
CHEWABLE_TABLET | ORAL | Status: AC
  Administered 2015-09-07: 324 mg via ORAL
  Filled 2015-09-07: qty 4

## 2015-09-07 MED ORDER — IOHEXOL 350 MG/ML SOLN
75.0000 mL | Freq: Once | INTRAVENOUS | Status: AC | PRN
  Administered 2015-09-07: 75 mL via INTRAVENOUS

## 2015-09-07 MED ORDER — ALBUTEROL SULFATE (2.5 MG/3ML) 0.083% IN NEBU
2.5000 mg | INHALATION_SOLUTION | RESPIRATORY_TRACT | Status: DC | PRN
  Administered 2015-09-08: 2.5 mg via RESPIRATORY_TRACT
  Filled 2015-09-07: qty 3

## 2015-09-07 MED ORDER — CLOPIDOGREL BISULFATE 75 MG PO TABS
75.0000 mg | ORAL_TABLET | Freq: Every day | ORAL | Status: DC
Start: 2015-09-07 — End: 2015-09-08
  Administered 2015-09-07: 75 mg via ORAL
  Filled 2015-09-07 (×2): qty 1

## 2015-09-07 MED ORDER — HEPARIN (PORCINE) IN NACL 100-0.45 UNIT/ML-% IJ SOLN
600.0000 [IU]/h | INTRAMUSCULAR | Status: DC
  Filled 2015-09-07: qty 250

## 2015-09-07 MED ORDER — HEPARIN BOLUS VIA INFUSION
3100.0000 [IU] | Freq: Once | INTRAVENOUS | Status: DC
  Filled 2015-09-07: qty 3100

## 2015-09-07 MED ORDER — AMITRIPTYLINE HCL 25 MG PO TABS
25.0000 mg | ORAL_TABLET | Freq: Every day | ORAL | Status: DC
  Administered 2015-09-08 – 2015-09-10 (×3): 25 mg via ORAL
  Filled 2015-09-07 (×4): qty 1

## 2015-09-07 MED ORDER — POLYETHYLENE GLYCOL 3350 17 G PO PACK: 17.0000 g | PACK | Freq: Every day | ORAL | Status: DC | PRN

## 2015-09-07 MED ORDER — ENOXAPARIN SODIUM 40 MG/0.4ML ~~LOC~~ SOLN
40.0000 mg | SUBCUTANEOUS | Status: DC
  Administered 2015-09-07 – 2015-09-09 (×3): 40 mg via SUBCUTANEOUS
  Filled 2015-09-07 (×3): qty 0.4

## 2015-09-07 MED ORDER — PNEUMOCOCCAL VAC POLYVALENT 25 MCG/0.5ML IJ INJ
0.5000 mL | INJECTION | INTRAMUSCULAR | Status: AC
  Administered 2015-09-08: 0.5 mL via INTRAMUSCULAR
  Filled 2015-09-07: qty 0.5

## 2015-09-07 MED ORDER — INSULIN ASPART 100 UNIT/ML ~~LOC~~ SOLN
0.0000 [IU] | Freq: Every day | SUBCUTANEOUS | Status: DC
  Filled 2015-09-07: qty 5

## 2015-09-07 MED ORDER — DOCUSATE SODIUM 100 MG PO CAPS
100.0000 mg | ORAL_CAPSULE | Freq: Two times a day (BID) | ORAL | Status: DC
  Administered 2015-09-07 – 2015-09-10 (×5): 100 mg via ORAL
  Filled 2015-09-07 (×7): qty 1

## 2015-09-07 MED ORDER — CARVEDILOL 12.5 MG PO TABS
25.0000 mg | ORAL_TABLET | Freq: Two times a day (BID) | ORAL | Status: DC
  Administered 2015-09-07 – 2015-09-10 (×6): 25 mg via ORAL
  Filled 2015-09-07: qty 4
  Filled 2015-09-07 (×2): qty 2
  Filled 2015-09-07: qty 4
  Filled 2015-09-07 (×2): qty 2

## 2015-09-07 MED ORDER — BENZONATATE 100 MG PO CAPS: 100.0000 mg | ORAL_CAPSULE | Freq: Three times a day (TID) | ORAL | Status: DC | PRN

## 2015-09-07 MED ORDER — ASPIRIN EC 81 MG PO TBEC: 81.0000 mg | DELAYED_RELEASE_TABLET | Freq: Every day | ORAL | Status: DC

## 2015-09-07 MED ORDER — ASPIRIN EC 81 MG PO TBEC
81.0000 mg | DELAYED_RELEASE_TABLET | Freq: Every day | ORAL | Status: DC
  Administered 2015-09-07 – 2015-09-10 (×4): 81 mg via ORAL
  Filled 2015-09-07 (×4): qty 1

## 2015-09-07 MED ORDER — BENZONATATE 100 MG PO CAPS
100.0000 mg | ORAL_CAPSULE | Freq: Once | ORAL | Status: AC
  Administered 2015-09-07: 100 mg via ORAL

## 2015-09-07 MED ORDER — ENALAPRILAT 1.25 MG/ML IV SOLN
1.2500 mg | Freq: Once | INTRAVENOUS | Status: AC
  Administered 2015-09-07: 1.25 mg via INTRAVENOUS
  Filled 2015-09-07: qty 2

## 2015-09-07 MED ORDER — IPRATROPIUM-ALBUTEROL 0.5-2.5 (3) MG/3ML IN SOLN
RESPIRATORY_TRACT | Status: AC
  Administered 2015-09-07: 3 mL via RESPIRATORY_TRACT
  Filled 2015-09-07: qty 3

## 2015-09-07 MED ORDER — ACETAMINOPHEN 650 MG RE SUPP: 650.0000 mg | Freq: Four times a day (QID) | RECTAL | Status: DC | PRN

## 2015-09-07 MED ORDER — BENZONATATE 100 MG PO CAPS
ORAL_CAPSULE | ORAL | Status: AC
  Administered 2015-09-07: 100 mg via ORAL
  Filled 2015-09-07: qty 1

## 2015-09-07 MED ORDER — HEPARIN SODIUM (PORCINE) 5000 UNIT/ML IJ SOLN: 60.0000 [IU]/kg | Freq: Once | INTRAMUSCULAR | Status: DC

## 2015-09-07 NOTE — Progress Notes (Signed)
ANTIBIOTIC CONSULT NOTE - INITIAL  Pharmacy Consult for Levaquin Indication: rule out pneumonia  Allergies  Allergen Reactions  . Other Itching    Most narcotics cause itching, unsure of what kinds    Patient Measurements: Height: 5' (152.4 cm) Weight: 115 lb (52.164 kg) IBW/kg (Calculated) : 45.5  Vital Signs: Temp: 96.6 F (35.9 C) (12/29 0752) Temp Source: Axillary (12/29 0752) BP: 135/82 mmHg (12/29 1030) Pulse Rate: 114 (12/29 1030) Intake/Output from previous day:   Intake/Output from this shift: Total I/O In: -  Out: 100 [Urine:100]  Labs:  Recent Labs  09/07/15 0758  WBC 11.5*  HGB 11.0*  PLT 274  CREATININE 0.80   Estimated Creatinine Clearance: 50.4 mL/min (by C-G formula based on Cr of 0.8). No results for input(s): VANCOTROUGH, VANCOPEAK, VANCORANDOM, GENTTROUGH, GENTPEAK, GENTRANDOM, TOBRATROUGH, TOBRAPEAK, TOBRARND, AMIKACINPEAK, AMIKACINTROU, AMIKACIN in the last 72 hours.   Microbiology: No results found for this or any previous visit (from the past 720 hour(s)).  Medical History: Past Medical History  Diagnosis Date  . CHF (congestive heart failure) ([place])   . Hypertension   . Coronary artery disease   . Renal insufficiency   . Scleroderma ([place])   . Chronic back pain   . GERD (gastroesophageal reflux disease)   . Blue skin   . COPD (chronic obstructive pulmonary disease) ([place])   . Raynaud disease   . Shortness of breath dyspnea   . Anginal pain ([place])   . Edema   . Hyperlipemia   . Ovarian cancer ([place])     chemo/rad  . 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 Vulvar cancer ([place])     Medications:  Scheduled:  . aspirin EC  81 mg Oral Daily  . docusate sodium  100 mg Oral BID  . enoxaparin (LOVENOX) injection  40 mg Subcutaneous Q24H  . furosemide  40 mg Intravenous BID  . insulin aspart  0-15 Units Subcutaneous TID WC  . insulin aspart  0-5 Units Subcutaneous QHS  . levofloxacin (LEVAQUIN) IV  750 mg Intravenous Q24H  . potassium chloride  40  mEq Oral 6 times per day  . sodium chloride  3 mL Intravenous Q12H  . sodium chloride  3 mL Intravenous Q12H   Infusions:    Assessment: 64 y/o F admitted with respiratory failure with acute on chronic CHF and possible PNA.   CrCl= 50 ml/min  Plan:  Leavqiun 750 mg iv daily ordered. Renal function is borderline for dosage adjustment. Will continue Levaquin as ordered for now and f/u renal function and culture results.   Ulice Dash D 09/07/2015,12:16 PM

## 2015-09-07 NOTE — H&P (Signed)
[facility] [place] NAME: Cheryl Hamilton    MR#:  [id number]  DATE OF BIRTH:  Jan 10, 1951  DATE OF ADMISSION:  09/07/2015  PRIMARY CARE PHYSICIAN: [facility], Chrissie Noa, MD   REQUESTING/REFERRING PHYSICIAN: Dr. Cinda Quest  CHIEF COMPLAINT:   Chief Complaint  Patient presents with  . Shortness of Breath    HISTORY OF PRESENT ILLNESS:  Cheryl Hamilton  is a 64 y.o. female with a known history of CHF, Subarachnoid bleed(06/2015), HTN , DM here with 2 days SOB. Orthopnea. Sputum-colorless. Saw here PCP and is on treatment for Acute bronchitis. Also has chest heaviness. Troponin 0.29. CXR shows CHF. Received 1 dose IV lasix in ED. EKG showed ST vs MAT in ED at 130s. Received Lopressor IV. Has h/o scleroderma and bluish discoloration of skin due to meds.   PAST MEDICAL HISTORY:   Past Medical History  Diagnosis Date  . CHF (congestive heart failure) ([place])   . Hypertension   . Coronary artery disease   . Renal insufficiency   . Diabetes mellitus, type 2 ([place])   . Scleroderma ([place])   . Chronic back pain   . GERD (gastroesophageal reflux disease)   . Blue skin   . COPD (chronic obstructive pulmonary disease) ([place])   . Raynaud disease   . Shortness of breath dyspnea   . Anginal pain ([place])   . Edema   . Hyperlipemia   . Ovarian cancer ([place])     chemo/rad  . 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 Vulvar cancer ([place])     PAST SURGICAL HISTORY:   Past Surgical History  Procedure Laterality Date  . Back surgery  1990  . Vulva surgery    . Nasal sinus surgery    . Cervical fusion    . Abdominal hysterectomy    . Cardiac catheterization  12/23/2013  . Coronary angioplasty with stent placement  12/23/2013  . Cardiac catheterization N/A 01/10/2015    Procedure: Left Heart Cath and Coronary Angiography;  Surgeon: Dionisio David, MD;  Location: [facility];  Service: Cardiovascular;  Laterality: N/A;  . Breast biopsy Left 01/15/12    neg   . Breast biopsy Left     neg bx/clip  . Breast biopsy Right     neg-bx/clip    SOCIAL HISTORY:   Social History  Substance Use Topics  . Smoking status: Former Smoker    Types: Cigarettes    Quit date: 07/12/2013  . Smokeless tobacco: Not on file  . Alcohol Use: No    FAMILY HISTORY:   Family History  Problem Relation Age of Onset  . Hypertension Other     DRUG ALLERGIES:   Allergies  Allergen Reactions  . Other Itching    Most narcotics cause itching, unsure of what kinds    REVIEW OF SYSTEMS:   Review of Systems  Constitutional: Positive for malaise/fatigue. Negative for fever, chills and weight loss.  HENT: Negative for hearing loss and nosebleeds.   Eyes: Negative for blurred vision, double vision and pain.  Respiratory: Positive for cough and sputum production. Negative for hemoptysis, shortness of breath and wheezing.   Cardiovascular: Positive for chest pain and orthopnea. Negative for palpitations and leg swelling.  Gastrointestinal: Negative for nausea, vomiting, abdominal pain, diarrhea and constipation.  Genitourinary: Negative for dysuria and hematuria.  Musculoskeletal: Negative for myalgias, back pain and falls.  Skin: Negative for rash.  Neurological: Positive for weakness. Negative for dizziness, tremors, sensory  change, speech change, focal weakness, seizures and headaches.  Endo/Heme/Allergies: Does not bruise/bleed easily.  Psychiatric/Behavioral: Negative for depression and memory loss. The patient is not nervous/anxious.     MEDICATIONS AT HOME:   Prior to Admission medications   Medication Sig Start Date End Date Taking? Authorizing Provider  acetaminophen (TYLENOL) 325 MG tablet Take 650 mg by mouth 4 (four) times daily.     Historical Provider, MD  acetaminophen (TYLENOL) 325 MG tablet Take 325 mg by mouth every 4 (four) hours as needed for mild pain, moderate pain or fever.    Historical Provider, MD  albuterol (PROVENTIL HFA;VENTOLIN  HFA) 108 (90 BASE) MCG/ACT inhaler Inhale 2 puffs into the lungs every 6 (six) hours as needed for wheezing or shortness of breath.    Historical Provider, MD  amitriptyline (ELAVIL) 25 MG tablet Take 1 tablet by mouth daily. 08/10/15   Historical Provider, MD  aspirin EC 81 MG tablet Take 81 mg by mouth daily.    Historical Provider, MD  benzonatate (TESSALON) 100 MG capsule Take 100 mg by mouth 3 (three) times daily as needed for cough.  03/21/15   Historical Provider, MD  carvedilol (COREG) 25 MG tablet Take 25 mg by mouth 2 (two) times daily with a meal.    Historical Provider, MD  cetirizine (ZYRTEC) 5 MG tablet Take 5 mg by mouth daily.    Historical Provider, MD  clopidogrel (PLAVIX) 75 MG tablet Take 75 mg by mouth daily. 08/10/15   Historical Provider, MD  cyclobenzaprine (FLEXERIL) 5 MG tablet Take 1 tablet by mouth 3 (three) times daily as needed for muscle spasms.  04/27/15   Historical Provider, MD  ergocalciferol (VITAMIN D2) 50000 UNITS capsule Take 50,000 Units by mouth every Sunday.     Historical Provider, MD  fenofibrate (TRICOR) 145 MG tablet Take 145 mg by mouth daily.    Historical Provider, MD  hydroxychloroquine (PLAQUENIL) 200 MG tablet Take 200 mg by mouth 2 (two) times daily.    Historical Provider, MD  ivabradine (CORLANOR) 5 MG TABS tablet Take 5 mg by mouth 2 (two) times daily with a meal.    Historical Provider, MD  Lidocaine, Anorectal, 5 % CREA Apply 1 application topically 3 (three) times daily as needed (pain). Apply a thin film to tender cuticle on right index finger    Historical Provider, MD  losartan (COZAAR) 25 MG tablet Take 25 mg by mouth daily. 08/10/15   Historical Provider, MD  minocycline (MINOCIN,DYNACIN) 100 MG capsule Take 1 capsule by mouth 2 (two) times daily. 04/13/15   Historical Provider, MD  morphine (MSIR) 15 MG tablet Take 15 mg by mouth every 4 (four) hours as needed for severe pain.    Historical Provider, MD  nystatin-triamcinolone (MYCOLOG II)  cream Apply 1 application topically 2 (two) times daily as needed (mouth).     Historical Provider, MD  ondansetron (ZOFRAN) 4 MG tablet Take 4 mg by mouth every 6 (six) hours as needed for nausea or vomiting.    Historical Provider, MD  pantoprazole (PROTONIX) 40 MG tablet Take 40 mg by mouth 2 (two) times daily.     Historical Provider, MD  simvastatin (ZOCOR) 40 MG tablet Take 40 mg by mouth every morning.     Historical Provider, MD  traMADol (ULTRAM) 50 MG tablet Take 1 tablet (50 mg total) by mouth every 6 (six) hours as needed for moderate pain. 06/15/15   Shanker Kristeen Mans, MD  triamcinolone (NASACORT) 55 MCG/ACT AERO  nasal inhaler Place 2 sprays into the nose daily as needed (allergies).     Historical Provider, MD      VITAL SIGNS:  Blood pressure 90/76, pulse 110, temperature 96.6 F (35.9 C), temperature source Axillary, resp. rate 21, height 5' (1.524 m), weight 52.164 kg (115 lb), SpO2 100 %.  PHYSICAL EXAMINATION:  Physical Exam  GENERAL:  64 y.o.-year-old patient lying in the bed with resp distress EYES: Pupils equal, round, reactive to light and accommodation. No scleral icterus. Extraocular muscles intact.  HEENT: Head atraumatic, normocephalic. Oropharynx and nasopharynx clear. No oropharyngeal erythema, moist oral mucosa  NECK:  Supple, no jugular venous distention. No thyroid enlargement, no tenderness.  LUNGS: Increased work of breathing. Crackles both inspiratory and expriratory CARDIOVASCULAR: S1, S2 normal. No murmurs, rubs, or gallops.  ABDOMEN: Soft, nontender, nondistended. Bowel sounds present. No organomegaly or mass.  EXTREMITIES: No pedal edema or clubbing. + 2 pedal & radial pulses b/l.   NEUROLOGIC: Cranial nerves II through XII are intact. No focal Motor or sensory deficits appreciated b/l PSYCHIATRIC: The patient is alert and oriented x 3. Good affect.  SKIN: No obvious rash, lesion, or ulcer.  Bluish discoloration of face and extremities- chronic per  patient  LABORATORY PANEL:   CBC  Recent Labs Lab 09/07/15 0758  WBC 11.5*  HGB 11.0*  HCT 35.3  PLT 274   ------------------------------------------------------------------------------------------------------------------  Chemistries   Recent Labs Lab 09/07/15 0758  NA 137  K 2.8*  CL 102  CO2 23  GLUCOSE 251*  BUN 8  CREATININE 0.80  CALCIUM 6.5*  AST 26  ALT 23  ALKPHOS 167*  BILITOT 0.8   ------------------------------------------------------------------------------------------------------------------  Cardiac Enzymes  Recent Labs Lab 09/07/15 0758  TROPONINI 0.29*   ------------------------------------------------------------------------------------------------------------------  RADIOLOGY:  Dg Chest Portable 1 View  09/07/2015  CLINICAL DATA:  64 year old female with shortness breath for the past 3 days, recently diagnosis with bronchitis. History of COPD. Dry cough and difficulty speaking. Cyanosis. History of scleroderma. EXAM: PORTABLE CHEST 1 VIEW COMPARISON:  Chest x-ray 06/12/2015. FINDINGS: Lung volumes are low and there are subtle reticular markings in the periphery of the mid to lower lungs bilaterally. Mild diffuse peribronchial cuffing. Bibasilar opacities favored to largely reflect areas of subsegmental atelectasis. No definite consolidative airspace disease. Blunting of the costophrenic sulci bilaterally may indicate trace bilateral pleural effusions, or could be technique related. Mild cephalization of the pulmonary vasculature. Heart size is mildly enlarged. The patient is rotated to the right on today's exam, resulting in distortion of the mediastinal contours and reduced diagnostic sensitivity and specificity for mediastinal pathology. Multiple healing fractures of left ribs and distal right clavicle are noted. Lucent lesion in the medial aspect of the right clavicle. Old healed fracture of the distal left clavicle again noted. IMPRESSION: 1.  Cardiomegaly with cephalization of the pulmonary vasculature and diffuse prominence of interstitial markings. This is likely reflective of congestive heart failure. 2. However, the patient has low lung volumes and the reticular markings throughout the mid to lower lungs bilaterally may also be a sign of progressive interstitial lung disease, particularly in this patient with history of scleroderma. Follow-up nonemergent high-resolution chest CT is suggested in the next 2-3 weeks after complete resolution of the patient's acute illness (to ensure resolution of the edema if present) to better evaluate these findings. 3. New lucent lesion in the medial aspect of the right clavicle. Given the patient's history of both vaginal an ovarian cancer, the possibility of metastatic disease is not  excluded, and attention to this lesion at type of follow-up high-resolution chest CT is recommended. Electronically Signed   By: Vinnie Langton M.D.   On: 09/07/2015 08:21     IMPRESSION AND PLAN:   * Acute on chronic diastolic chf - IV Lasix - Input and Output - Counseled to limit fluids and Salt - Monitor Bun/Cr and Potassium - Echo -Cardiology consult  * Acute hypoxic resp failure O2. Wean as tolerated Nebs PRN Consult pulmonary - Pneumonia?  * Elevated troponin - Likely Demand ischemia Trend troponin. Consult cardiology Check Echo  * ST vs MAT Cardiology consult IV cardizem PRN  * Hypokalemia replace PO  * HTN Hold meds due to boderline low BP  * DM SSI, ADA  * DVT prophylaxis Lovenox   All the records are reviewed and case discussed with ED provider. Management plans discussed with the patient, family and they are in agreement.  CODE STATUS: FULL  TOTAL CRITICAL CARE TIME TAKING CARE OF THIS PATIENT: 45 minutes.    Hillary Bow R M.D on 09/07/2015 at 9:42 AM  Between 7am to 6pm - Pager - [phone]  After 6pm go to [email or web address] - password [email or web address] [place]  [facility]  Office  [phone]  CC: Primary care physician; [facility], Chrissie Noa, MD     Note: This dictation was prepared with Dragon dictation along with smaller phrase technology. Any transcriptional errors that result from this process are unintentional.

## 2015-09-07 NOTE — Progress Notes (Signed)
*  PRELIMINARY RESULTS* Echocardiogram 2D Echocardiogram has been performed.  Cheryl Hamilton 09/07/2015, 4:51 PM

## 2015-09-07 NOTE — ED Provider Notes (Signed)
[facility] Emergency Department Provider Note  ____________________________________________  Time seen: Approximately 8:05 AM  I have reviewed the triage vital signs and the nursing notes.   HISTORY  Chief Complaint Shortness of Breath    HPI Cheryl Hamilton is a 64 y.o. female who complains of shortness of breath or cough. She short of breath 2 nights ago and again last night last night was worse. She reports her chest feels very heavy. She denies any fever says the cough is productive of small amounts of sputum she cannot say if there is any color in them. Shortness of breath is worse when she lays down. She says she saw her doctor yesterday and he is treating her for bronchitis with some antibiotics twice a day for 10 days. Patient reports her skin blueness of her skin is chronic and normal for her.  Past Medical History  Diagnosis Date  . CHF (congestive heart failure) ([place])   . Hypertension   . Coronary artery disease   . Renal insufficiency   . Diabetes mellitus, type 2 ([place])   . Scleroderma ([place])   . Chronic back pain   . GERD (gastroesophageal reflux disease)   . Blue skin   . COPD (chronic obstructive pulmonary disease) ([place])   . Raynaud disease   . Shortness of breath dyspnea   . Anginal pain ([place])   . Edema   . Hyperlipemia   . Ovarian cancer ([place])     chemo/rad  . 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 Vulvar cancer [facility])     Patient Active Problem List   Diagnosis Date Noted  . Scleroderma ([place]) 06/12/2015  . ARF (acute renal failure) ([place]) 06/12/2015  . Intracranial bleed ([place]) 06/12/2015  . C7 cervical fracture ([place]) 06/12/2015  . CAD (coronary artery disease) s/p PCI. 06/12/2015  . Chronic anemia 06/12/2015  . Acute encephalopathy 06/12/2015  . Orthostatic hypotension 06/12/2015  . Vaginal cancer ([place]) 04/26/2015  . Raynaud disease 12/28/2014  . Chronic systolic heart failure ([place]) 12/28/2014    Past Surgical History  Procedure  Laterality Date  . Back surgery  1990  . Vulva surgery    . Nasal sinus surgery    . Cervical fusion    . Abdominal hysterectomy    . Cardiac catheterization  12/23/2013  . Coronary angioplasty with stent placement  12/23/2013  . Cardiac catheterization N/A 01/10/2015    Procedure: Left Heart Cath and Coronary Angiography;  Surgeon: Dionisio David, MD;  Location: [facility];  Service: Cardiovascular;  Laterality: N/A;  . Breast biopsy Left 01/15/12    neg  . Breast biopsy Left     neg bx/clip  . Breast biopsy Right     neg-bx/clip    Current Outpatient Rx  Name  Route  Sig  Dispense  Refill  . acetaminophen (TYLENOL) 325 MG tablet   Oral   Take 650 mg by mouth 4 (four) times daily.          Marland Kitchen acetaminophen (TYLENOL) 325 MG tablet   Oral   Take 325 mg by mouth every 4 (four) hours as needed for mild pain, moderate pain or fever.         Marland Kitchen albuterol (PROVENTIL HFA;VENTOLIN HFA) 108 (90 BASE) MCG/ACT inhaler   Inhalation   Inhale 2 puffs into the lungs every 6 (six) hours as needed for wheezing or shortness of breath.         Marland Kitchen amitriptyline (ELAVIL) 25 MG tablet  Oral   Take 1 tablet by mouth daily.      3   . aspirin EC 81 MG tablet   Oral   Take 81 mg by mouth daily.         . benzonatate (TESSALON) 100 MG capsule   Oral   Take 100 mg by mouth 3 (three) times daily as needed for cough.       99   . carvedilol (COREG) 25 MG tablet   Oral   Take 25 mg by mouth 2 (two) times daily with a meal.         . cetirizine (ZYRTEC) 5 MG tablet   Oral   Take 5 mg by mouth daily.         . clopidogrel (PLAVIX) 75 MG tablet   Oral   Take 75 mg by mouth daily.      5   . cyclobenzaprine (FLEXERIL) 5 MG tablet   Oral   Take 1 tablet by mouth 3 (three) times daily as needed for muscle spasms.       1   . ergocalciferol (VITAMIN D2) 50000 UNITS capsule   Oral   Take 50,000 Units by mouth every Sunday.          . fenofibrate (TRICOR) 145 MG  tablet   Oral   Take 145 mg by mouth daily.         . hydroxychloroquine (PLAQUENIL) 200 MG tablet   Oral   Take 200 mg by mouth 2 (two) times daily.         . ivabradine (CORLANOR) 5 MG TABS tablet   Oral   Take 5 mg by mouth 2 (two) times daily with a meal.         . Lidocaine, Anorectal, 5 % CREA   Topical   Apply 1 application topically 3 (three) times daily as needed (pain). Apply a thin film to tender cuticle on right index finger         . losartan (COZAAR) 25 MG tablet   Oral   Take 25 mg by mouth daily.      4   . minocycline (MINOCIN,DYNACIN) 100 MG capsule   Oral   Take 1 capsule by mouth 2 (two) times daily.         Marland Kitchen morphine (MSIR) 15 MG tablet   Oral   Take 15 mg by mouth every 4 (four) hours as needed for severe pain.         Marland Kitchen nystatin-triamcinolone (MYCOLOG II) cream   Topical   Apply 1 application topically 2 (two) times daily as needed (mouth).          . ondansetron (ZOFRAN) 4 MG tablet   Oral   Take 4 mg by mouth every 6 (six) hours as needed for nausea or vomiting.         . pantoprazole (PROTONIX) 40 MG tablet   Oral   Take 40 mg by mouth 2 (two) times daily.          . simvastatin (ZOCOR) 40 MG tablet   Oral   Take 40 mg by mouth every morning.       5   . traMADol (ULTRAM) 50 MG tablet   Oral   Take 1 tablet (50 mg total) by mouth every 6 (six) hours as needed for moderate pain.   30 tablet   0   . triamcinolone (NASACORT) 55 MCG/ACT AERO nasal inhaler   Nasal  Place 2 sprays into the nose daily as needed (allergies).            Allergies Other  Family History  Problem Relation Age of Onset  . Hypertension Other     Social History Social History  Substance Use Topics  . Smoking status: Former Smoker    Types: Cigarettes    Quit date: 07/12/2013  . Smokeless tobacco: Not on file  . Alcohol Use: No    Review of Systems Constitutional: No fever/chills Eyes: No visual changes. ENT: No sore  throat. Cardiovascular: See history of present illness Respiratory: See history of present illness Gastrointestinal: No abdominal pain.  No nausea, no vomiting.  No diarrhea.  No constipation. Genitourinary: Negative for dysuria. Musculoskeletal: Negative for back pain. Skin: Negative for rash. Neurological: Negative for headaches, focal weakness or numbness.  10-point ROS otherwise negative.  ____________________________________________   PHYSICAL EXAM:  VITAL SIGNS: ED Triage Vitals  Enc Vitals Group     BP 09/07/15 0752 167/114 mmHg     Pulse Rate 09/07/15 0752 133     Resp 09/07/15 0752 45     Temp 09/07/15 0752 96.6 F (35.9 C)     Temp Source 09/07/15 0752 Axillary     SpO2 09/07/15 0748 96 %     Weight 09/07/15 0752 115 lb (52.164 kg)     Height 09/07/15 0752 5' (1.524 m)     Head Cir --      Peak Flow --      Pain Score 09/07/15 0800 9     Pain Loc --      Pain Edu? --      Excl. in [place]? --     Constitutional: Alert and oriented. In mild respiratory distress Eyes: Conjunctivae are normal. PERRL. EOMI. Head: Atraumatic. Nose: No congestion/rhinnorhea. Mouth/Throat: Mucous membranes are moist.  Oropharynx non-erythematous. Neck: No stridor.  Trace JVD }Cardiovascular: Tachycardia, regular rhythm. Grossly normal heart sounds.  Good peripheral circulation. Respiratory: Normal respiratory effort.  No retractions. Lungs crackles in bases Gastrointestinal: Soft and nontender. No distention. No abdominal bruits. No CVA tenderness. No organomegaly no hepatojugular reflux Musculoskeletal: No lower extremity tenderness trace edema.  No joint effusions. Neurologic:  Normal speech and language. No gross focal neurologic deficits are appreciated. No gait instability. Skin:  Skin is warm, dry and intact. Patient has blue skin which she reports is chronic   ____________________________________________   LABS (all labs ordered are listed, but only abnormal results are  displayed)  Labs Reviewed  COMPREHENSIVE METABOLIC PANEL - Abnormal; Notable for the following:    Potassium 2.8 (*)    Glucose, Bld 251 (*)    Calcium 6.5 (*)    Albumin 2.8 (*)    Alkaline Phosphatase 167 (*)    All other components within normal limits  TROPONIN I - Abnormal; Notable for the following:    Troponin I 0.29 (*)    All other components within normal limits  LACTIC ACID, PLASMA - Abnormal; Notable for the following:    Lactic Acid, Venous 2.4 (*)    All other components within normal limits  CBC WITH DIFFERENTIAL/PLATELET - Abnormal; Notable for the following:    WBC 11.5 (*)    Hemoglobin 11.0 (*)    MCV 76.3 (*)    MCH 23.9 (*)    MCHC 31.3 (*)    RDW 19.1 (*)    Neutro Abs 9.8 (*)    Lymphs Abs 0.9 (*)    All other components  within normal limits  BLOOD GAS, ARTERIAL - Abnormal; Notable for the following:    pH, Arterial 7.52 (*)    pCO2 arterial 28 (*)    pO2, Arterial 67 (*)    All other components within normal limits  PROTIME-INR - Abnormal; Notable for the following:    Prothrombin Time 17.0 (*)    All other components within normal limits  APTT  BRAIN NATRIURETIC PEPTIDE  LACTIC ACID, PLASMA   ____________________________________________  EKG  EKG read and interpreted by me shows sinus tachycardia at a rate of 133 normal axis diffuse ST-T wave flattening very poor baseline EKG #2 sinus tachycardia versus possible multi focal tachycardia at a rate of 115 normal axis no acute ST-T changes    ____________________________________________  RADIOLOGY  Chest x-ray read and interpreted by me shows chronic congestive heart failure Radiologist and interpreted film raises the possibility of also interstitial lung disease and sees a lucency in the clavicle which could represent a met ____________________________________________   PROCEDURES  Procedure(s) performed:   Critical Care performed critical care time 15 minutes Discussed with hospitalist  patient is to Has a respiratory alkalosis tachycardic with a history of vulvar cancer and her cough is not really productive at all we'll CT her to rule out the possibility of a PE ____________________________________________   INITIAL IMPRESSION / ASSESSMENT AND PLAN / ED COURSE  Pertinent labs & imaging results that were available during my care of the patient were reviewed by me and considered in my medical decision making (see chart for details).   ____________________________________________   FINAL CLINICAL IMPRESSION(S) / ED DIAGNOSES  Final diagnoses:  Dyspnea  Hypoxia  Elevated troponin  Tachycardia  Elevated lactic acid level      Nena Polio, MD 09/07/15 [phone]

## 2015-09-07 NOTE — ED Notes (Signed)
Patient presents to the ED with shortness of breath via EMS from home.  Patient reports shortness of breath began Monday, patient saw pcp yesterday and was diagnosed with bronchitis.  Patient has COPD normally.  Patient reports during the night having increased shortness of breath.  Patient has a dry cough in triage and is having difficulty speaking.  Patient is a cyanotic color, due to medication from sclera-derma medication.  Patient's skin is very dry.  Patient's lung sounds are diminished with some wheezing.

## 2015-09-07 NOTE — Progress Notes (Signed)
Cheryl Hamilton is a 64 y.o. female  [id number]  Primary [place]  Reason for Consultation: elevated troponins  HPI: 89 YOWF presented with severe shortness of breat which got worse to the poit could not sleep and came to ER.   Review of Systems: No chest pain  Past Medical History  Diagnosis Date  . CHF (congestive heart failure) ([place])   . Hypertension   . Coronary artery disease   . Renal insufficiency   . Scleroderma ([place])   . Chronic back pain   . GERD (gastroesophageal reflux disease)   . Blue skin   . COPD (chronic obstructive pulmonary disease) ([place])   . Raynaud disease   . Shortness of breath dyspnea   . Anginal pain ([place])   . Edema   . Hyperlipemia   . Ovarian cancer ([place])     chemo/rad  . Vaginal cancer ([place])   . Vulvar cancer ([place])     Medications Prior to Admission  Medication Sig Dispense Refill  . acetaminophen (TYLENOL) 325 MG tablet Take 325 mg by mouth every 4 (four) hours as needed for mild pain, moderate pain or fever.    Marland Kitchen albuterol (PROVENTIL HFA;VENTOLIN HFA) 108 (90 BASE) MCG/ACT inhaler Inhale 2 puffs into the lungs every 6 (six) hours as needed for wheezing or shortness of breath.    Marland Kitchen amitriptyline (ELAVIL) 25 MG tablet Take 1 tablet by mouth daily.  3  . aspirin EC 81 MG tablet Take 81 mg by mouth daily.    . benzonatate (TESSALON) 100 MG capsule Take 100 mg by mouth 3 (three) times daily as needed for cough.   99  . carvedilol (COREG) 25 MG tablet Take 25 mg by mouth 2 (two) times daily with a meal.    . cefdinir (OMNICEF) 300 MG capsule Take 300 mg by mouth 2 (two) times daily.    . clopidogrel (PLAVIX) 75 MG tablet Take 75 mg by mouth daily.  5  . furosemide (LASIX) 20 MG tablet Take 20 mg by mouth daily.    . ivabradine (CORLANOR) 5 MG TABS tablet Take 5 mg by mouth 2 (two) times daily with a meal.    . losartan (COZAAR) 25 MG tablet Take 25 mg by mouth daily.  4  . ondansetron (ZOFRAN) 4 MG tablet Take 4 mg by mouth  every 6 (six) hours as needed for nausea or vomiting.    . pantoprazole (PROTONIX) 40 MG tablet Take 1 tablet by mouth 2 (two) times daily.    . simvastatin (ZOCOR) 40 MG tablet Take 40 mg by mouth every morning.   5     . amitriptyline  25 mg Oral Daily  . aspirin EC  81 mg Oral Daily  . carvedilol  25 mg Oral BID WC  . clopidogrel  75 mg Oral Daily  . docusate sodium  100 mg Oral BID  . enoxaparin (LOVENOX) injection  40 mg Subcutaneous Q24H  . furosemide  40 mg Intravenous BID  . insulin aspart  0-15 Units Subcutaneous TID WC  . insulin aspart  0-5 Units Subcutaneous QHS  . ivabradine  5 mg Oral BID WC  . levofloxacin (LEVAQUIN) IV  750 mg Intravenous Q24H  . pantoprazole  40 mg Oral BID  . [START ON 09/08/2015] pneumococcal 23 valent vaccine  0.5 mL Intramuscular Tomorrow-1000  . potassium chloride  40 mEq Oral 6 times per day  . [START ON 09/08/2015] simvastatin  40 mg Oral BH-[id number]  .  sodium chloride  3 mL Intravenous Q12H  . sodium chloride  3 mL Intravenous Q12H    Infusions:    Allergies  Allergen Reactions  . Other Itching    Most narcotics cause itching, unsure of what kinds    Social History   Social History  . Marital Status: Single    Spouse Name: N/A  . Number of Children: N/A  . Years of Education: N/A   Occupational History  . Not on file.   Social History Main Topics  . Smoking status: Former Smoker    Types: Cigarettes    Quit date: 07/12/2013  . Smokeless tobacco: Not on file  . Alcohol Use: No  . Drug Use: No  . Sexual Activity: Not on file   Other Topics Concern  . Not on file   Social History Narrative    Family History  Problem Relation Age of Onset  . Hypertension Other     PHYSICAL EXAM: Filed Vitals:   09/07/15 1030 09/07/15 1200  BP: 135/82   Pulse: 114   Temp:  98 F (36.7 C)  Resp: 23      Intake/Output Summary (Last 24 hours) at 09/07/15 1515 Last data filed at 09/07/15 1200  Gross per 24 hour  Intake    240  ml  Output    200 ml  Net     40 ml    General:  Well appearing. No respiratory difficulty HEENT: normal Neck: supple. no JVD. Carotids 2+ bilat; no bruits. No lymphadenopathy or thryomegaly appreciated. Cor: PMI nondisplaced. Regular rate & rhythm. No rubs, gallops or murmurs. Lungs: clear Abdomen: soft, nontender, nondistended. No hepatosplenomegaly. No bruits or masses. Good bowel sounds. Extremities: no cyanosis, clubbing, rash, edema Neuro: alert & oriented x 3, cranial nerves grossly intact. moves all 4 extremities w/o difficulty. Affect pleasant.  ECG: Sinus tachycardia with non-specific st and t changes  Results for orders placed or performed during the hospital encounter of 09/07/15 (from the past 24 hour(s))  Comprehensive metabolic panel     Status: Abnormal   Collection Time: 09/07/15  7:58 AM  Result Value Ref Range   Sodium 137 135 - 145 mmol/L   Potassium 2.8 (LL) 3.5 - 5.1 mmol/L   Chloride 102 101 - 111 mmol/L   CO2 23 22 - 32 mmol/L   Glucose, Bld 251 (H) 65 - 99 mg/dL   BUN 8 6 - 20 mg/dL   Creatinine, Ser 0.80 0.44 - 1.00 mg/dL   Calcium 6.5 (L) 8.9 - 10.3 mg/dL   Total Protein 6.9 6.5 - 8.1 g/dL   Albumin 2.8 (L) 3.5 - 5.0 g/dL   AST 26 15 - 41 U/L   ALT 23 14 - 54 U/L   Alkaline Phosphatase 167 (H) 38 - 126 U/L   Total Bilirubin 0.8 0.3 - 1.2 mg/dL   GFR calc non Af Amer >60 >60 mL/min   GFR calc Af Amer >60 >60 mL/min   Anion gap 12 5 - 15  Brain natriuretic peptide     Status: Abnormal   Collection Time: 09/07/15  7:58 AM  Result Value Ref Range   B Natriuretic Peptide >4500.0 (H) 0.0 - 100.0 pg/mL  Troponin I     Status: Abnormal   Collection Time: 09/07/15  7:58 AM  Result Value Ref Range   Troponin I 0.29 (H) <0.031 ng/mL  CBC with Differential     Status: Abnormal   Collection Time: 09/07/15  7:58 AM  Result  Value Ref Range   WBC 11.5 (H) 3.6 - 11.0 K/uL   RBC 4.62 3.80 - 5.20 MIL/uL   Hemoglobin 11.0 (L) 12.0 - 16.0 g/dL   HCT 35.3 35.0  - 47.0 %   MCV 76.3 (L) 80.0 - 100.0 fL   MCH 23.9 (L) 26.0 - 34.0 pg   MCHC 31.3 (L) 32.0 - 36.0 g/dL   RDW 19.1 (H) 11.5 - 14.5 %   Platelets 274 150 - 440 K/uL   Neutrophils Relative % 85 %   Neutro Abs 9.8 (H) 1.4 - 6.5 K/uL   Lymphocytes Relative 8 %   Lymphs Abs 0.9 (L) 1.0 - 3.6 K/uL   Monocytes Relative 7 %   Monocytes Absolute 0.8 0.2 - 0.9 K/uL   Eosinophils Relative 0 %   Eosinophils Absolute 0.0 0 - 0.7 K/uL   Basophils Relative 0 %   Basophils Absolute 0.0 0 - 0.1 K/uL  Protime-INR     Status: Abnormal   Collection Time: 09/07/15  7:58 AM  Result Value Ref Range   Prothrombin Time 17.0 (H) 11.4 - 15.0 seconds   INR 1.37   APTT     Status: None   Collection Time: 09/07/15  7:58 AM  Result Value Ref Range   aPTT 32 24 - 36 seconds  Lactic acid, plasma     Status: Abnormal   Collection Time: 09/07/15  8:11 AM  Result Value Ref Range   Lactic Acid, Venous 2.4 (HH) 0.5 - 2.0 mmol/L  Blood gas, arterial     Status: Abnormal   Collection Time: 09/07/15  9:02 AM  Result Value Ref Range   FIO2 28.00    Delivery systems NASAL CANNULA    pH, Arterial 7.52 (H) 7.350 - 7.450   pCO2 arterial 28 (L) 32.0 - 48.0 mmHg   pO2, Arterial 67 (L) 83.0 - 108.0 mmHg   Bicarbonate 22.9 21.0 - 28.0 mEq/L   Acid-Base Excess 0.7 0.0 - 3.0 mmol/L   O2 Saturation 95.0 %   Patient temperature 37.0    Collection site RIGHT RADIAL    Sample type ARTERIAL DRAW    Allens test (pass/fail) PASS PASS  Glucose, capillary     Status: Abnormal   Collection Time: 09/07/15 11:30 AM  Result Value Ref Range   Glucose-Capillary 117 (H) 65 - 99 mg/dL  MRSA PCR Screening     Status: None   Collection Time: 09/07/15 11:43 AM  Result Value Ref Range   MRSA by PCR NEGATIVE NEGATIVE  Lactic acid, plasma     Status: None   Collection Time: 09/07/15 11:51 AM  Result Value Ref Range   Lactic Acid, Venous 2.0 0.5 - 2.0 mmol/L  Troponin I (q 6hr x 3)     Status: Abnormal   Collection Time: 09/07/15 11:51  AM  Result Value Ref Range   Troponin I 0.44 (H) <0.031 ng/mL   Ct Angio Chest Pe W/cm &/or Wo Cm  09/07/2015  CLINICAL DATA:  Hypoxia and difficulty breathing EXAM: CT ANGIOGRAPHY CHEST WITH CONTRAST TECHNIQUE: Multidetector CT imaging of the chest was performed using the standard protocol during bolus administration of intravenous contrast. Multiplanar CT image reconstructions and MIPs were obtained to evaluate the vascular anatomy. CONTRAST:  34mL OMNIPAQUE IOHEXOL 350 MG/ML SOLN COMPARISON:  12/12/2014 FINDINGS: Some stable scarring is noted in the lingula best seen on image number 79. And has a somewhat nodular appearance measuring 7 mm although stable in appearance from the prior study. Dependent  atelectatic changes are noted. No focal infiltrate is seen. Small bilateral pleural effusions are noted. The thoracic inlet is within normal limits. Thoracic aorta demonstrates calcific changes without evidence of dissection or aneurysmal dilatation. Pulmonary artery is well visualized within normal branching pattern. No evidence of pulmonary emboli are noted. No hilar or mediastinal adenopathy is seen. Moderate coronary calcifications are noted. The visualized upper abdomen reveals evidence of a the moderate to high-grade stenosis in the proximal left renal artery. The right renal artery is not well appreciated. The right kidney is smaller than the left and this may be related to renal artery stenosis. This is stable from the prior exam. The osseous structures demonstrate prior right distal clavicular fracture is well as multiple bilateral rib fractures with varying degrees of healing. These posttraumatic changes are new from the prior exam. Sclerotic changes are noted in C7 and T2 which were not present in April of 2016 but present on a recent CT of the cervical spine dated 06/11/2015. These were likely post traumatic in nature. Interval sternal fracture is noted superiorly when compared with the prior exam  from April of 2016. Review of the MIP images confirms the above findings. IMPRESSION: No evidence of pulmonary emboli. Stable nodular scarring in the lingula as described above. 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 Changes consistent with prior bony trauma with bilateral rib fractures, C7 and T2 fractures, right clavicular fracture and a superior sternal fracture. Electronically Signed   By: Inez Catalina M.D.   On: 09/07/2015 10:24   Dg Chest Portable 1 View  09/07/2015  CLINICAL DATA:  64 year old female with shortness breath for the past 3 days, recently diagnosis with bronchitis. History of COPD. Dry cough and difficulty speaking. Cyanosis. History of scleroderma. EXAM: PORTABLE CHEST 1 VIEW COMPARISON:  Chest x-ray 06/12/2015. FINDINGS: Lung volumes are low and there are subtle reticular markings in the periphery of the mid to lower lungs bilaterally. Mild diffuse peribronchial cuffing. Bibasilar opacities favored to largely reflect areas of subsegmental atelectasis. No definite consolidative airspace disease. Blunting of the costophrenic sulci bilaterally may indicate trace bilateral pleural effusions, or could be technique related. Mild cephalization of the pulmonary vasculature. Heart size is mildly enlarged. The patient is rotated to the right on today's exam, resulting in distortion of the mediastinal contours and reduced diagnostic sensitivity and specificity for mediastinal pathology. Multiple healing fractures of left ribs and distal right clavicle are noted. Lucent lesion in the medial aspect of the right clavicle. Old healed fracture of the distal left clavicle again noted. IMPRESSION: 1. Cardiomegaly with cephalization of the pulmonary vasculature and diffuse prominence of interstitial markings. This is likely reflective of congestive heart failure. 2. However, the patient has low lung volumes and the reticular markings  throughout the mid to lower lungs bilaterally may also be a sign of progressive interstitial lung disease, particularly in this patient with history of scleroderma. Follow-up nonemergent high-resolution chest CT is suggested in the next 2-3 weeks after complete resolution of the patient's acute illness (to ensure resolution of the edema if present) to better evaluate these findings. 3. New lucent lesion in the medial aspect of the right clavicle. Given the patient's history of both vaginal an ovarian cancer, the possibility of metastatic disease is not excluded, and attention to this lesion at type of follow-up high-resolution chest CT is recommended. Electronically Signed   By: Vinnie Langton M.D.   On: 09/07/2015 08:21  ASSESSMENT AND PLAN: CHF with elevated BNP and mildly elevated troponins. Cardiac cath 01/10/15 , which showed stent in LAD patent with occluded distal RCA and bifurction lesion between OM2 and LCX 80 % but too small for PCI and bifurcation, thus complex . Advise echo and B-blockers and lasix.  Biagio Snelson A

## 2015-09-07 NOTE — Consult Note (Signed)
[facility] [facility] Consultation      Date: 09/07/2015,   MRN# [id number] Cheryl Hamilton 02-Jun-1951 Code Status:     Code Status Orders        Start     Ordered   09/07/15 0935  Full code   Continuous     09/07/15 0936     Hosp day:@LENGTHOFSTAYDAYS @ Referring MD: @ATDPROV @     PCP:      AdmissionWeight: 115 lb (52.164 kg)                 CurrentWeight: 115 lb (52.164 kg) Cheryl Hamilton is a 64 y.o. old female seen in consultation for COPD at the request of Dr. Darvin Neighbours     CHIEF COMPLAINT:   Acute SOb   HISTORY OF PRESENT ILLNESS  64 y.o. female with a known history of CHF, Subarachnoid bleed(06/2015), HTN , and Scleroderma Admitted for acute SOB for last several days associated with cough and congestion. Patient is former smoker, has a dx of COPD, has not seen a lung doctor in past SOB and COUGH NOT associated with Lower ext swelling, Patient dx with scleroderma, has blue skin due to meds Patient has h/o CHF   Her symptoms improved immediately after being placed on oxygen and getting a breathing treatment   PAST MEDICAL HISTORY   Past Medical History  Diagnosis Date  . CHF (congestive heart failure) ([place])   . Hypertension   . Coronary artery disease   . Renal insufficiency   . Scleroderma ([place])   . Chronic back pain   . GERD (gastroesophageal reflux disease)   . Blue skin   . COPD (chronic obstructive pulmonary disease) ([place])   . Raynaud disease   . Shortness of breath dyspnea   . Anginal pain ([place])   . Edema   . Hyperlipemia   . Ovarian cancer ([place])     chemo/rad  . 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 Vulvar cancer [facility])      SURGICAL HISTORY   Past Surgical History  Procedure Laterality Date  . Back surgery  1990  . Vulva surgery    . Nasal sinus surgery    . Cervical fusion    . Abdominal hysterectomy    . Cardiac catheterization  12/23/2013  . Coronary angioplasty with stent placement  12/23/2013  . Cardiac catheterization  N/A 01/10/2015    Procedure: Left Heart Cath and Coronary Angiography;  Surgeon: Dionisio David, MD;  Location: [facility];  Service: Cardiovascular;  Laterality: N/A;  . Breast biopsy Left 01/15/12    neg  . Breast biopsy Left     neg bx/clip  . Breast biopsy Right     neg-bx/clip     FAMILY HISTORY   Family History  Problem Relation Age of Onset  . Hypertension Other      SOCIAL HISTORY   Social History  Substance Use Topics  . Smoking status: Former Smoker    Types: Cigarettes    Quit date: 07/12/2013  . Smokeless tobacco: None  . Alcohol Use: No     MEDICATIONS    Home Medication:  No current outpatient prescriptions on file.  Current Medication:  Current facility-administered medications:  .  0.9 %  sodium chloride infusion, 250 mL, Intravenous, PRN, Hillary Bow, MD .  acetaminophen (TYLENOL) tablet 650 mg, 650 mg, Oral, Q6H PRN **OR** acetaminophen (TYLENOL) suppository 650 mg, 650 mg, Rectal, Q6H PRN, Hillary Bow, MD .  albuterol (PROVENTIL) (2.5  MG/3ML) 0.083% nebulizer solution 2.5 mg, 2.5 mg, Nebulization, Q2H PRN, Srikar Sudini, MD .  amitriptyline (ELAVIL) tablet 25 mg, 25 mg, Oral, Daily, Srikar Sudini, MD .  aspirin EC tablet 81 mg, 81 mg, Oral, Daily, Srikar Sudini, MD .  benzonatate (TESSALON) capsule 100 mg, 100 mg, Oral, TID PRN, Hillary Bow, MD .  carvedilol (COREG) tablet 25 mg, 25 mg, Oral, BID WC, Srikar Sudini, MD .  clopidogrel (PLAVIX) tablet 75 mg, 75 mg, Oral, Daily, Srikar Sudini, MD .  diltiazem (CARDIZEM) injection 10 mg, 10 mg, Intravenous, Q4H PRN, Srikar Sudini, MD .  docusate sodium (COLACE) capsule 100 mg, 100 mg, Oral, BID, Srikar Sudini, MD .  enoxaparin (LOVENOX) injection 40 mg, 40 mg, Subcutaneous, Q24H, Srikar Sudini, MD .  furosemide (LASIX) injection 40 mg, 40 mg, Intravenous, BID, Srikar Sudini, MD .  guaiFENesin-dextromethorphan (ROBITUSSIN DM) 100-10 MG/5ML syrup 5 mL, 5 mL, Oral, Q4H PRN, Srikar Sudini,  MD .  insulin aspart (novoLOG) injection 0-15 Units, 0-15 Units, Subcutaneous, TID WC, Srikar Sudini, MD .  insulin aspart (novoLOG) injection 0-5 Units, 0-5 Units, Subcutaneous, QHS, Srikar Sudini, MD .  ivabradine (CORLANOR) tablet 5 mg, 5 mg, Oral, BID WC, Srikar Sudini, MD .  levofloxacin (LEVAQUIN) IVPB 750 mg, 750 mg, Intravenous, Q24H, Srikar Sudini, MD .  ondansetron (ZOFRAN) tablet 4 mg, 4 mg, Oral, Q6H PRN **OR** ondansetron (ZOFRAN) injection 4 mg, 4 mg, Intravenous, Q6H PRN, Srikar Sudini, MD .  pantoprazole (PROTONIX) EC tablet 40 mg, 40 mg, Oral, BID, Srikar Sudini, MD .  polyethylene glycol (MIRALAX / GLYCOLAX) packet 17 g, 17 g, Oral, Daily PRN, Srikar Sudini, MD .  potassium chloride 20 MEQ/15ML (10%) solution 40 mEq, 40 mEq, Oral, 6 times per day, Hillary Bow, MD .  Derrill Memo ON 09/08/2015] simvastatin (ZOCOR) tablet 40 mg, 40 mg, Oral, BH-q7a, Srikar Sudini, MD .  sodium chloride 0.9 % injection 3 mL, 3 mL, Intravenous, Q12H, Srikar Sudini, MD .  sodium chloride 0.9 % injection 3 mL, 3 mL, Intravenous, Q12H, Srikar Sudini, MD .  sodium chloride 0.9 % injection 3 mL, 3 mL, Intravenous, PRN, Hillary Bow, MD    ALLERGIES   Other     REVIEW OF SYSTEMS   Review of Systems  Constitutional: Positive for malaise/fatigue. Negative for fever, chills and weight loss.  HENT: Negative for congestion and hearing loss.   Eyes: Negative for blurred vision and double vision.  Respiratory: Positive for cough, shortness of breath and wheezing.   Cardiovascular: Negative for chest pain, palpitations and orthopnea.  Gastrointestinal: Negative for heartburn, nausea, vomiting and abdominal pain.  Genitourinary: Negative for dysuria and urgency.  Musculoskeletal: Negative for myalgias, back pain and neck pain.  Skin: Negative for rash.  Neurological: Negative for dizziness, tingling, tremors, weakness and headaches.  Endo/Heme/Allergies: Does not bruise/bleed easily.   Psychiatric/Behavioral: Negative.   All other systems reviewed and are negative.    VS: BP 135/82 mmHg  Pulse 114  Temp(Src) 98 F (36.7 C) (Oral)  Resp 23  Ht 5' (1.524 m)  Wt 115 lb (52.164 kg)  BMI 22.46 kg/m2  SpO2 95%     PHYSICAL EXAM  Physical Exam  Constitutional: She is oriented to person, place, and time. She appears well-developed and well-nourished. No distress.  HENT:  Head: Normocephalic and atraumatic.  Mouth/Throat: No oropharyngeal exudate.  Eyes: EOM are normal. Pupils are equal, round, and reactive to light. No scleral icterus.  Neck: Normal range of motion. Neck supple.  Cardiovascular: Normal  rate, regular rhythm and normal heart sounds.   No murmur heard. Pulmonary/Chest: No stridor. No respiratory distress. She has no wheezes. She has rales.  Abdominal: Soft. Bowel sounds are normal. She exhibits no distension. There is no tenderness. There is no rebound.  Musculoskeletal: Normal range of motion. She exhibits no edema.  Neurological: She is alert and oriented to person, place, and time. She displays normal reflexes. Coordination normal.  Skin: Skin is warm. She is not diaphoretic.  Blue skin  Psychiatric: She has a normal mood and affect.        LABS    Recent Labs     09/07/15  0758  HGB  11.0*  HCT  35.3  MCV  76.3*  WBC  11.5*  BUN  8  CREATININE  0.80  GLUCOSE  251*  CALCIUM  6.5*  INR  1.37  ,    No results for input(s): PH in the last 72 hours.  Invalid input(s): PCO2, PO2, BASEEXCESS, BASEDEFICITE, TFT    CULTURE RESULTS   No results found for this or any previous visit (from the past 240 hour(s)).        IMAGING    Ct Angio Chest Pe W/cm &/or Wo Cm  09/07/2015  CLINICAL DATA:  Hypoxia and difficulty breathing EXAM: CT ANGIOGRAPHY CHEST WITH CONTRAST TECHNIQUE: Multidetector CT imaging of the chest was performed using the standard protocol during bolus administration of intravenous contrast. Multiplanar CT  image reconstructions and MIPs were obtained to evaluate the vascular anatomy. CONTRAST:  59mL OMNIPAQUE IOHEXOL 350 MG/ML SOLN COMPARISON:  12/12/2014 FINDINGS: Some stable scarring is noted in the lingula best seen on image number 79. And has a somewhat nodular appearance measuring 7 mm although stable in appearance from the prior study. Dependent atelectatic changes are noted. No focal infiltrate is seen. Small bilateral pleural effusions are noted. The thoracic inlet is within normal limits. Thoracic aorta demonstrates calcific changes without evidence of dissection or aneurysmal dilatation. Pulmonary artery is well visualized within normal branching pattern. No evidence of pulmonary emboli are noted. No hilar or mediastinal adenopathy is seen. Moderate coronary calcifications are noted. The visualized upper abdomen reveals evidence of a the moderate to high-grade stenosis in the proximal left renal artery. The right renal artery is not well appreciated. The right kidney is smaller than the left and this may be related to renal artery stenosis. This is stable from the prior exam. The osseous structures demonstrate prior right distal clavicular fracture is well as multiple bilateral rib fractures with varying degrees of healing. These posttraumatic changes are new from the prior exam. Sclerotic changes are noted in C7 and T2 which were not present in April of 2016 but present on a recent CT of the cervical spine dated 06/11/2015. These were likely post traumatic in nature. Interval sternal fracture is noted superiorly when compared with the prior exam from April of 2016. Review of the MIP images confirms the above findings. IMPRESSION: No evidence of pulmonary emboli. Stable nodular scarring in the lingula as described above. 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 Changes consistent with prior bony trauma with bilateral rib fractures, C7 and T2  fractures, right clavicular fracture and a superior sternal fracture. Electronically Signed   By: Inez Catalina M.D.   On: 09/07/2015 10:24   Dg Chest Portable 1 View  09/07/2015  CLINICAL DATA:  64 year old female with shortness breath for the past 3 days,  recently diagnosis with bronchitis. History of COPD. Dry cough and difficulty speaking. Cyanosis. History of scleroderma. EXAM: PORTABLE CHEST 1 VIEW COMPARISON:  Chest x-ray 06/12/2015. FINDINGS: Lung volumes are low and there are subtle reticular markings in the periphery of the mid to lower lungs bilaterally. Mild diffuse peribronchial cuffing. Bibasilar opacities favored to largely reflect areas of subsegmental atelectasis. No definite consolidative airspace disease. Blunting of the costophrenic sulci bilaterally may indicate trace bilateral pleural effusions, or could be technique related. Mild cephalization of the pulmonary vasculature. Heart size is mildly enlarged. The patient is rotated to the right on today's exam, resulting in distortion of the mediastinal contours and reduced diagnostic sensitivity and specificity for mediastinal pathology. Multiple healing fractures of left ribs and distal right clavicle are noted. Lucent lesion in the medial aspect of the right clavicle. Old healed fracture of the distal left clavicle again noted. IMPRESSION: 1. Cardiomegaly with cephalization of the pulmonary vasculature and diffuse prominence of interstitial markings. This is likely reflective of congestive heart failure. 2. However, the patient has low lung volumes and the reticular markings throughout the mid to lower lungs bilaterally may also be a sign of progressive interstitial lung disease, particularly in this patient with history of scleroderma. Follow-up nonemergent high-resolution chest CT is suggested in the next 2-3 weeks after complete resolution of the patient's acute illness (to ensure resolution of the edema if present) to better evaluate these  findings. 3. New lucent lesion in the medial aspect of the right clavicle. Given the patient's history of both vaginal an ovarian cancer, the possibility of metastatic disease is not excluded, and attention to this lesion at type of follow-up high-resolution chest CT is recommended. Electronically Signed   By: Vinnie Langton M.D.   On: 09/07/2015 08:21      ASSESSMENT/PLAN   64 yo white female with acute SOB from acute COPD exacerbation  from acute Bronchitis with acute CHF exacerbation Patient has felt better after placement of oxygen and breathing treatment  1.oxygen as needed 2.continue IV abx as prescribed 3.start solumedrol 20 mg IV BID 4.start dounebs, pulmicort nebs 5.lasix as tolerated 6.follow up ECHO and cardiology recs 7.anti tussive meds as needed  No indication for ICU/SD monitoring at this time. Recommend Transfer to gen med floor    I have personally obtained a history, examined the patient, evaluated laboratory and independently reviewed imaging results, formulated the assessment and plan and placed orders.  Ct Chest 12/29 images reviewed 09/07/2015   The Patient requires high complexity decision making for assessment and support, frequent evaluation and titration of therapies, application of advanced monitoring technologies and extensive interpretation of multiple databases.  Patient satisfied with Plan of action and management. All questions answered  Corrin Parker, M.D.  [facility]  Medical Director [place] Director [facility] Cardio-Pulmonary Department

## 2015-09-08 ENCOUNTER — Inpatient Hospital Stay: Admit: 2015-09-08 | Payer: Medicare Other

## 2015-09-08 DIAGNOSIS — I110 Hypertensive heart disease with heart failure: Secondary | ICD-10-CM | POA: Diagnosis not present

## 2015-09-08 LAB — COMPREHENSIVE METABOLIC PANEL
ALBUMIN: 2.3 g/dL — AB (ref 3.5–5.0)
ALK PHOS: 139 U/L — AB (ref 38–126)
ALT: 20 U/L (ref 14–54)
ANION GAP: 10 (ref 5–15)
AST: 26 U/L (ref 15–41)
BILIRUBIN TOTAL: 0.7 mg/dL (ref 0.3–1.2)
BUN: 11 mg/dL (ref 6–20)
CHLORIDE: 102 mmol/L (ref 101–111)
CO2: 24 mmol/L (ref 22–32)
Calcium: 6.5 mg/dL — ABNORMAL LOW (ref 8.9–10.3)
Creatinine, Ser: 0.98 mg/dL (ref 0.44–1.00)
GFR calc non Af Amer: 59 mL/min — ABNORMAL LOW (ref >60)
Glucose, Bld: 137 mg/dL — ABNORMAL HIGH (ref 65–99)
Potassium: 2.6 mmol/L — CL (ref 3.5–5.1)
Sodium: 136 mmol/L (ref 135–145)
Total Protein: 5.9 g/dL — ABNORMAL LOW (ref 6.5–8.1)

## 2015-09-08 LAB — CBC
HEMATOCRIT: 31.4 % — AB (ref 35.0–47.0)
HEMOGLOBIN: 9.9 g/dL — AB (ref 12.0–16.0)
MCH: 24.2 pg — AB (ref 26.0–34.0)
MCHC: 31.6 g/dL — ABNORMAL LOW (ref 32.0–36.0)
MCV: 76.5 fL — AB (ref 80.0–100.0)
Platelets: 195 10*3/uL (ref 150–440)
RBC: 4.1 MIL/uL (ref 3.80–5.20)
RDW: 19.6 % — ABNORMAL HIGH (ref 11.5–14.5)
WBC: 10.2 10*3/uL (ref 3.6–11.0)

## 2015-09-08 LAB — GLUCOSE, CAPILLARY
GLUCOSE-CAPILLARY: 150 mg/dL — AB (ref 65–99)
GLUCOSE-CAPILLARY: 181 mg/dL — AB (ref 65–99)
Glucose-Capillary: 165 mg/dL — ABNORMAL HIGH (ref 65–99)
Glucose-Capillary: 149 mg/dL — ABNORMAL HIGH (ref 65–99)

## 2015-09-08 LAB — MAGNESIUM: Magnesium: 0.6 mg/dL — CL (ref 1.7–2.4)

## 2015-09-08 LAB — TROPONIN I: TROPONIN I: 0.2 ng/mL — AB (ref <0.031)

## 2015-09-08 MED ORDER — LEVOFLOXACIN IN D5W 500 MG/100ML IV SOLN
500.0000 mg | INTRAVENOUS | Status: DC
  Administered 2015-09-09: 500 mg via INTRAVENOUS
  Filled 2015-09-08: qty 100

## 2015-09-08 MED ORDER — MAGNESIUM SULFATE 2 GM/50ML IV SOLN
2.0000 g | Freq: Once | INTRAVENOUS | Status: AC
  Administered 2015-09-08: 2 g via INTRAVENOUS
  Filled 2015-09-08: qty 50

## 2015-09-08 MED ORDER — TICAGRELOR 90 MG PO TABS
90.0000 mg | ORAL_TABLET | Freq: Two times a day (BID) | ORAL | Status: DC
  Administered 2015-09-08 – 2015-09-10 (×5): 90 mg via ORAL
  Filled 2015-09-08 (×5): qty 1

## 2015-09-08 MED ORDER — ALPRAZOLAM 0.25 MG PO TABS
0.2500 mg | ORAL_TABLET | Freq: Three times a day (TID) | ORAL | Status: DC | PRN
  Administered 2015-09-08 – 2015-09-09 (×4): 0.25 mg via ORAL
  Filled 2015-09-08 (×4): qty 1

## 2015-09-08 MED ORDER — LEVOFLOXACIN IN D5W 500 MG/100ML IV SOLN: 500.0000 mg | INTRAVENOUS | Status: DC

## 2015-09-08 MED ORDER — METOPROLOL SUCCINATE ER 50 MG PO TB24
50.0000 mg | ORAL_TABLET | Freq: Every day | ORAL | Status: DC
  Administered 2015-09-08 – 2015-09-10 (×3): 50 mg via ORAL
  Filled 2015-09-08 (×3): qty 1

## 2015-09-08 MED ORDER — POTASSIUM CHLORIDE 20 MEQ/15ML (10%) PO SOLN
40.0000 meq | Freq: Three times a day (TID) | ORAL | Status: AC
  Administered 2015-09-08: 40 meq via ORAL
  Filled 2015-09-08 (×2): qty 30

## 2015-09-08 MED ORDER — POTASSIUM CHLORIDE CRYS ER 20 MEQ PO TBCR
40.0000 meq | EXTENDED_RELEASE_TABLET | Freq: Three times a day (TID) | ORAL | Status: DC
  Administered 2015-09-08: 40 meq via ORAL
  Filled 2015-09-08: qty 2

## 2015-09-08 MED ORDER — POTASSIUM CHLORIDE 20 MEQ/15ML (10%) PO SOLN
40.0000 meq | ORAL | Status: DC
  Filled 2015-09-08: qty 30

## 2015-09-08 NOTE — Progress Notes (Signed)
[facility] NAME: Cheryl Hamilton    MR#:  [id number]  DATE OF BIRTH:  1950-11-06  SUBJECTIVE:  CHIEF COMPLAINT:   Chief Complaint  Patient presents with  . Shortness of Breath     Was in stepdown unit- now she is on telemetry floor- feels SOB.   Have some chest tightness also.  Oxygen saturation is satisfactory.  REVIEW OF SYSTEMS:  CONSTITUTIONAL: No fever, fatigue or weakness.  EYES: No blurred or double vision.  EARS, NOSE, AND THROAT: No tinnitus or ear pain.  RESPIRATORY: No cough,positive for shortness of breath,  No wheezing or hemoptysis.  CARDIOVASCULAR: No chest pain, orthopnea, edema.  GASTROINTESTINAL: No nausea, vomiting, diarrhea or abdominal pain.  GENITOURINARY: No dysuria, hematuria.  ENDOCRINE: No polyuria, nocturia,  HEMATOLOGY: No anemia, easy bruising or bleeding SKIN: No rash or lesion. MUSCULOSKELETAL: No joint pain or arthritis.   NEUROLOGIC: No tingling, numbness, weakness.  PSYCHIATRY: No anxiety or depression.   ROS  DRUG ALLERGIES:   Allergies  Allergen Reactions  . Other Itching    Most narcotics cause itching, unsure of what kinds    VITALS:  Blood pressure 107/75, pulse 81, temperature 97.6 F (36.4 C), temperature source Oral, resp. rate 18, height 5' (1.524 m), weight 51.529 kg (113 lb 9.6 oz), SpO2 96 %.  PHYSICAL EXAMINATION:  GENERAL:  64 y.o.-year-old patient lying in the bed with some acute respi  distress.  EYES: Pupils equal, round, reactive to light and accommodation. No scleral icterus. Extraocular muscles intact.  HEENT: Head atraumatic, normocephalic. Oropharynx and nasopharynx clear.  NECK:  Supple, no jugular venous distention. No thyroid enlargement, no tenderness.  LUNGS: Normal breath sounds bilaterally, some wheezing, few b/l crepitation. Positive use of accessory muscles of respiration. Pt appears cyanotic allover her skin. CARDIOVASCULAR: S1, S2 normal. No  murmurs, rubs, or gallops.  ABDOMEN: Soft, nontender, nondistended. Bowel sounds present. No organomegaly or mass.  EXTREMITIES: No pedal edema,positive for cyanosis, no or clubbing.  NEUROLOGIC: Cranial nerves II through XII are intact. Muscle strength 5/5 in all extremities. Sensation intact. Gait not checked.  PSYCHIATRIC: The patient is alert and oriented x 3.  SKIN: No obvious rash, lesion, or ulcer.   Physical Exam LABORATORY PANEL:   CBC  Recent Labs Lab 09/08/15 0444  WBC 10.2  HGB 9.9*  HCT 31.4*  PLT 195   ------------------------------------------------------------------------------------------------------------------  Chemistries   Recent Labs Lab 09/08/15 0444  NA 136  K 2.6*  CL 102  CO2 24  GLUCOSE 137*  BUN 11  CREATININE 0.98  CALCIUM 6.5*  MG 0.6*  AST 26  ALT 20  ALKPHOS 139*  BILITOT 0.7   ------------------------------------------------------------------------------------------------------------------  Cardiac Enzymes  Recent Labs Lab 09/07/15 1151 09/07/15 1822  TROPONINI 0.44* 0.50*   ------------------------------------------------------------------------------------------------------------------  RADIOLOGY:  Ct Angio Chest Pe W/cm &/or Wo Cm  09/07/2015  CLINICAL DATA:  Hypoxia and difficulty breathing EXAM: CT ANGIOGRAPHY CHEST WITH CONTRAST TECHNIQUE: Multidetector CT imaging of the chest was performed using the standard protocol during bolus administration of intravenous contrast. Multiplanar CT image reconstructions and MIPs were obtained to evaluate the vascular anatomy. CONTRAST:  26mL OMNIPAQUE IOHEXOL 350 MG/ML SOLN COMPARISON:  12/12/2014 FINDINGS: Some stable scarring is noted in the lingula best seen on image number 79. And has a somewhat nodular appearance measuring 7 mm although stable in appearance from the prior study. Dependent atelectatic changes are noted. No focal infiltrate is seen. Small bilateral pleural effusions  are noted. The thoracic inlet is within normal limits. Thoracic aorta demonstrates calcific changes without evidence of dissection or aneurysmal dilatation. Pulmonary artery is well visualized within normal branching pattern. No evidence of pulmonary emboli are noted. No hilar or mediastinal adenopathy is seen. Moderate coronary calcifications are noted. The visualized upper abdomen reveals evidence of a the moderate to high-grade stenosis in the proximal left renal artery. The right renal artery is not well appreciated. The right kidney is smaller than the left and this may be related to renal artery stenosis. This is stable from the prior exam. The osseous structures demonstrate prior right distal clavicular fracture is well as multiple bilateral rib fractures with varying degrees of healing. These posttraumatic changes are new from the prior exam. Sclerotic changes are noted in C7 and T2 which were not present in April of 2016 but present on a recent CT of the cervical spine dated 06/11/2015. These were likely post traumatic in nature. Interval sternal fracture is noted superiorly when compared with the prior exam from April of 2016. Review of the MIP images confirms the above findings. IMPRESSION: No evidence of pulmonary emboli. Stable nodular scarring in the lingula as described above. 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 Changes consistent with prior bony trauma with bilateral rib fractures, C7 and T2 fractures, right clavicular fracture and a superior sternal fracture. Electronically Signed   By: Inez Catalina M.D.   On: 09/07/2015 10:24   Dg Chest Portable 1 View  09/07/2015  CLINICAL DATA:  64 year old female with shortness breath for the past 3 days, recently diagnosis with bronchitis. History of COPD. Dry cough and difficulty speaking. Cyanosis. History of scleroderma. EXAM: PORTABLE CHEST 1 VIEW COMPARISON:  Chest x-ray  06/12/2015. FINDINGS: Lung volumes are low and there are subtle reticular markings in the periphery of the mid to lower lungs bilaterally. Mild diffuse peribronchial cuffing. Bibasilar opacities favored to largely reflect areas of subsegmental atelectasis. No definite consolidative airspace disease. Blunting of the costophrenic sulci bilaterally may indicate trace bilateral pleural effusions, or could be technique related. Mild cephalization of the pulmonary vasculature. Heart size is mildly enlarged. The patient is rotated to the right on today's exam, resulting in distortion of the mediastinal contours and reduced diagnostic sensitivity and specificity for mediastinal pathology. Multiple healing fractures of left ribs and distal right clavicle are noted. Lucent lesion in the medial aspect of the right clavicle. Old healed fracture of the distal left clavicle again noted. IMPRESSION: 1. Cardiomegaly with cephalization of the pulmonary vasculature and diffuse prominence of interstitial markings. This is likely reflective of congestive heart failure. 2. However, the patient has low lung volumes and the reticular markings throughout the mid to lower lungs bilaterally may also be a sign of progressive interstitial lung disease, particularly in this patient with history of scleroderma. Follow-up nonemergent high-resolution chest CT is suggested in the next 2-3 weeks after complete resolution of the patient's acute illness (to ensure resolution of the edema if present) to better evaluate these findings. 3. New lucent lesion in the medial aspect of the right clavicle. Given the patient's history of both vaginal an ovarian cancer, the possibility of metastatic disease is not excluded, and attention to this lesion at type of follow-up high-resolution chest CT is recommended. Electronically Signed   By: Vinnie Langton M.D.   On: 09/07/2015 08:21    ASSESSMENT AND PLAN:   Active Problems:   CHF (congestive heart  failure) (H[facility])   COPD exacerbation ([facility])   Acute bronchitis   * Acute on chronic systolic and diastolic chf - IV Lasix - Input and Output - Counseled to limit fluids and Salt - Monitor Bun/Cr and Potassium - Echo reviewed- EF is 40%- decreased from recent echo with normal EF. -Appreciated Cardiology consult  * Acute hypoxic resp failure O2. Wean as tolerated Nebs PRN Consult pulmonary - Pneumonia?  COnt IV Abx.  * Elevated troponin - Likely Demand ischemia Stable on Trend troponin. Appreciated Consult cardiology Checked Echo  * ST vs MAT Cardiology consult IV cardizem PRN  * Hypokalemia replace PO  * hypomagnesemia   Replace IV.  * HTN Hold meds due to boderline low BP  * DM SSI, ADA  * DVT prophylaxis Lovenox   All the records are reviewed and case discussed with Care Management/Social Workerr. Management plans discussed with the patient, family and they are in agreement.  CODE STATUS: Full.  TOTAL TIME TAKING CARE OF THIS PATIENT: 35 minutes.   POSSIBLE D/C IN 1-2 DAYS, DEPENDING ON CLINICAL CONDITION.   Vaughan Basta M.D on 09/08/2015   Between 7am to 6pm - Pager - [phone]  After 6pm go to [email or web address] - password [email or web address] [facility]  Office  [phone]  CC: Primary care physician; [facility], Chrissie Noa, MD  Note: This dictation was prepared with Dragon dictation along with smaller phrase technology. Any transcriptional errors that result from this process are unintentional.

## 2015-09-08 NOTE — Progress Notes (Signed)
SUBJECTIVE: Patient's breathing has significantly improved since yesterday and denies any chest pain or palpitation.   Filed Vitals:   09/08/15 0500 09/08/15 0600 09/08/15 0700 09/08/15 0800  BP: 134/78 139/75  140/80  Pulse:    98  Temp:    97.4 F (36.3 C)  TempSrc:    Oral  Resp: 26 26 14 27   Height:      Weight: 114 lb 10.2 oz (52 kg)     SpO2:  98%  100%    Intake/Output Summary (Last 24 hours) at 09/08/15 1022 Last data filed at 09/08/15 1000  Gross per 24 hour  Intake   1203 ml  Output    101 ml  Net   1102 ml    LABS: Basic Metabolic Panel:  Recent Labs  09/07/15 0758 09/08/15 0444  NA 137 136  K 2.8* 2.6*  CL 102 102  CO2 23 24  GLUCOSE 251* 137*  BUN 8 11  CREATININE 0.80 0.98  CALCIUM 6.5* 6.5*   Liver Function Tests:  Recent Labs  09/07/15 0758 09/08/15 0444  AST 26 26  ALT 23 20  ALKPHOS 167* 139*  BILITOT 0.8 0.7  PROT 6.9 5.9*  ALBUMIN 2.8* 2.3*   No results for input(s): LIPASE, AMYLASE in the last 72 hours. CBC:  Recent Labs  09/07/15 0758 09/08/15 0444  WBC 11.5* 10.2  NEUTROABS 9.8*  --   HGB 11.0* 9.9*  HCT 35.3 31.4*  MCV 76.3* 76.5*  PLT 274 195   Cardiac Enzymes:  Recent Labs  09/07/15 0758 09/07/15 1151 09/07/15 1822  TROPONINI 0.29* 0.44* 0.50*   BNP: Invalid input(s): POCBNP D-Dimer: No results for input(s): DDIMER in the last 72 hours. Hemoglobin A1C: No results for input(s): HGBA1C in the last 72 hours. Fasting Lipid Panel: No results for input(s): CHOL, HDL, LDLCALC, TRIG, CHOLHDL, LDLDIRECT in the last 72 hours. Thyroid Function Tests: No results for input(s): TSH, T4TOTAL, T3FREE, THYROIDAB in the last 72 hours.  Invalid input(s): FREET3 Anemia Panel: No results for input(s): VITAMINB12, FOLATE, FERRITIN, TIBC, IRON, RETICCTPCT in the last 72 hours.   PHYSICAL EXAM General: Well developed, well nourished, in no acute distress HEENT:  Normocephalic and atramatic Neck:  No JVD.  Lungs: Clear  bilaterally to auscultation and percussion. Heart: HRRR . Normal S1 and S2 without gallops or murmurs.  Abdomen: Bowel sounds are positive, abdomen soft and non-tender  Msk:  Back normal, normal gait. Normal strength and tone for age. Extremities: No clubbing, cyanosis or edema.   Neuro: Alert and oriented X 3. Psych:  Good affect, responds appropriately  TELEMETRY: Sinus rhythm at 88 bpm  ASSESSMENT AND PLAN: Congestive heart failure with left ventricular ejection fraction 40% on echocardiogram done today. She had normal left radicular systolic function [about 2 months ago in the office, LVEF was 60% in the office. She presented with congestive heart failure versus interstitial lung disease and tachycardia. It's possible patient had tachycardia-induced cardiomyopathy thus will increase the dosage of metoprolol. #2 patient has elevated troponin with history of coronary artery disease status post PCI and stenting of the mid LAD which on 01/2015 had 30% restenosis in the LAD stent. There was 80% disease at the bifurcation between the OM 2 and left circumflex which was complex lesion and was too small for PCI. She was at that time decided to treat medically. Advise changing Plavix brillanta.  Active Problems:   CHF (congestive heart failure) (HCC)   COPD exacerbation (HCC)   Acute  bronchitis    Jayon Matton A, MD, [facility] 09/08/2015 10:22 AM

## 2015-09-08 NOTE — Discharge Instructions (Signed)
Heart Failure Clinic appointment on September 29, 2015 at 11:00am with Darylene Price, [place]. Please call [phone] to reschedule.

## 2015-09-08 NOTE — Progress Notes (Signed)
Notified Dr. Darvin Neighbours of critical potassium level of 2.6. MD acknowledged result and will replace. See orders

## 2015-09-08 NOTE — Consult Note (Signed)
[facility] Consultation      Date: 09/08/2015,   MRN# [id number] Cheryl Hamilton Jul 18, 1951 Code Status:     Code Status Orders        Start     Ordered   09/07/15 0935  Full code   Continuous     09/07/15 0936     Hosp day:@LENGTHOFSTAYDAYS @ Referring MD: @ATDPROV @     PCP:      AdmissionWeight: 115 lb (52.164 kg)                 CurrentWeight: 114 lb 10.2 oz (52 kg) Cheryl Hamilton is a 64 y.o. old female seen in consultation for COPD at the request of Dr. Darvin Neighbours     CHIEF COMPLAINT:   Acute SOb   HISTORY OF PRESENT ILLNESS  Feels much better today, will wean off minimal oxygen Denies CP/SOB at this time      MEDICATIONS    Home Medication:  No current outpatient prescriptions on file.  Current Medication:  Current facility-administered medications:  .  0.9 %  sodium chloride infusion, 250 mL, Intravenous, PRN, Hillary Bow, MD .  acetaminophen (TYLENOL) tablet 650 mg, 650 mg, Oral, Q6H PRN **OR** acetaminophen (TYLENOL) suppository 650 mg, 650 mg, Rectal, Q6H PRN, Srikar Sudini, MD .  albuterol (PROVENTIL) (2.5 MG/3ML) 0.083% nebulizer solution 2.5 mg, 2.5 mg, Nebulization, Q2H PRN, Srikar Sudini, MD .  amitriptyline (ELAVIL) tablet 25 mg, 25 mg, Oral, Daily, Srikar Sudini, MD, 25 mg at 09/07/15 1245 .  aspirin EC tablet 81 mg, 81 mg, Oral, Daily, Hillary Bow, MD, 81 mg at 09/07/15 1445 .  benzonatate (TESSALON) capsule 100 mg, 100 mg, Oral, TID PRN, Hillary Bow, MD .  carvedilol (COREG) tablet 25 mg, 25 mg, Oral, BID WC, Srikar Sudini, MD, 25 mg at 09/08/15 0843 .  clopidogrel (PLAVIX) tablet 75 mg, 75 mg, Oral, Daily, Hillary Bow, MD, 75 mg at 09/07/15 1445 .  diltiazem (CARDIZEM) injection 10 mg, 10 mg, Intravenous, Q4H PRN, Srikar Sudini, MD .  docusate sodium (COLACE) capsule 100 mg, 100 mg, Oral, BID, Hillary Bow, MD, 100 mg at 09/07/15 2113 .  enoxaparin (LOVENOX) injection 40 mg, 40 mg, Subcutaneous, Q24H,  Srikar Sudini, MD, 40 mg at 09/07/15 2114 .  furosemide (LASIX) injection 40 mg, 40 mg, Intravenous, BID, Hillary Bow, MD, 40 mg at 09/08/15 0843 .  guaiFENesin-dextromethorphan (ROBITUSSIN DM) 100-10 MG/5ML syrup 5 mL, 5 mL, Oral, Q4H PRN, Srikar Sudini, MD .  insulin aspart (novoLOG) injection 0-15 Units, 0-15 Units, Subcutaneous, TID WC, Hillary Bow, MD, 3 Units at 09/08/15 [phone] .  insulin aspart (novoLOG) injection 0-5 Units, 0-5 Units, Subcutaneous, QHS, Hillary Bow, MD, 0 Units at 09/07/15 2209 .  ivabradine (CORLANOR) tablet 5 mg, 5 mg, Oral, BID WC, Hillary Bow, MD, 5 mg at 09/08/15 0856 .  levofloxacin (LEVAQUIN) IVPB 750 mg, 750 mg, Intravenous, Q24H, Srikar Sudini, MD .  ondansetron (ZOFRAN) tablet 4 mg, 4 mg, Oral, Q6H PRN **OR** ondansetron (ZOFRAN) injection 4 mg, 4 mg, Intravenous, Q6H PRN, Srikar Sudini, MD .  pantoprazole (PROTONIX) EC tablet 40 mg, 40 mg, Oral, BID, Hillary Bow, MD, 40 mg at 09/07/15 2113 .  pneumococcal 23 valent vaccine (PNU-IMMUNE) injection 0.5 mL, 0.5 mL, Intramuscular, Tomorrow-1000, Srikar Sudini, MD .  polyethylene glycol (MIRALAX / GLYCOLAX) packet 17 g, 17 g, Oral, Daily PRN, Srikar Sudini, MD .  potassium chloride 20 MEQ/15ML (10%) solution 40 mEq, 40 mEq, Oral, TID, Hillary Bow, MD .  simvastatin (ZOCOR) tablet 40 mg, 40 mg, Oral, BH-q7a, Hillary Bow, MD, 40 mg at 09/08/15 0843 .  sodium chloride 0.9 % injection 3 mL, 3 mL, Intravenous, Q12H, Srikar Sudini, MD, 3 mL at 09/07/15 2115 .  sodium chloride 0.9 % injection 3 mL, 3 mL, Intravenous, Q12H, Hillary Bow, MD, 3 mL at 09/07/15 2114 .  sodium chloride 0.9 % injection 3 mL, 3 mL, Intravenous, PRN, Hillary Bow, MD    ALLERGIES   Other     REVIEW OF SYSTEMS   Review of Systems  Constitutional: Negative for fever, chills, weight loss and malaise/fatigue.  HENT: Negative for congestion and hearing loss.   Eyes: Negative for blurred vision and double vision.  Respiratory:  Negative for cough, hemoptysis, sputum production, shortness of breath and wheezing.   Cardiovascular: Negative for chest pain, palpitations and orthopnea.  Gastrointestinal: Negative.   Musculoskeletal: Positive for joint pain.  Skin: Negative for rash.  Neurological: Negative for headaches.  Psychiatric/Behavioral: Negative.   All other systems reviewed and are negative.    VS: BP 140/80 mmHg  Pulse 98  Temp(Src) 97.4 F (36.3 C) (Oral)  Resp 27  Ht 5' (1.524 m)  Wt 114 lb 10.2 oz (52 kg)  BMI 22.39 kg/m2  SpO2 100%     PHYSICAL EXAM  Physical Exam  Constitutional: She is oriented to person, place, and time. No distress.  Eyes: EOM are normal. Pupils are equal, round, and reactive to light.  Cardiovascular: Normal rate, regular rhythm and normal heart sounds.   No murmur heard. Pulmonary/Chest: No respiratory distress. She has no wheezes. She has no rales.  Musculoskeletal: Normal range of motion. She exhibits no edema.  Neurological: She is alert and oriented to person, place, and time. She displays normal reflexes. Coordination normal.  Skin: Skin is warm. She is not diaphoretic.  Blue skin  Psychiatric: She has a normal mood and affect.        LABS    Recent Labs     09/07/15  0758  09/08/15  0444  HGB  11.0*  9.9*  HCT  35.3  31.4*  MCV  76.3*  76.5*  WBC  11.5*  10.2  BUN  8  11  CREATININE  0.80  0.98  GLUCOSE  251*  137*  CALCIUM  6.5*  6.5*  INR  1.37   --   ,    No results for input(s): PH in the last 72 hours.  Invalid input(s): PCO2, PO2, BASEEXCESS, BASEDEFICITE, TFT    CULTURE RESULTS   Recent Results (from the past 240 hour(s))  MRSA PCR Screening     Status: None   Collection Time: 09/07/15 11:43 AM  Result Value Ref Range Status   MRSA by PCR NEGATIVE NEGATIVE Final    Comment:        The GeneXpert MRSA Assay (FDA approved for NASAL specimens only), is one component of a comprehensive MRSA colonization surveillance  program. It is not intended to diagnose MRSA infection nor to guide or monitor treatment for MRSA infections.           IMAGING    Ct Angio Chest Pe W/cm &/or Wo Cm  09/07/2015  CLINICAL DATA:  Hypoxia and difficulty breathing EXAM: CT ANGIOGRAPHY CHEST WITH CONTRAST TECHNIQUE: Multidetector CT imaging of the chest was performed using the standard protocol during bolus administration of intravenous contrast. Multiplanar CT image reconstructions and MIPs were obtained to evaluate the vascular anatomy. CONTRAST:  46mL OMNIPAQUE IOHEXOL  350 MG/ML SOLN COMPARISON:  12/12/2014 FINDINGS: Some stable scarring is noted in the lingula best seen on image number 79. And has a somewhat nodular appearance measuring 7 mm although stable in appearance from the prior study. Dependent atelectatic changes are noted. No focal infiltrate is seen. Small bilateral pleural effusions are noted. The thoracic inlet is within normal limits. Thoracic aorta demonstrates calcific changes without evidence of dissection or aneurysmal dilatation. Pulmonary artery is well visualized within normal branching pattern. No evidence of pulmonary emboli are noted. No hilar or mediastinal adenopathy is seen. Moderate coronary calcifications are noted. The visualized upper abdomen reveals evidence of a the moderate to high-grade stenosis in the proximal left renal artery. The right renal artery is not well appreciated. The right kidney is smaller than the left and this may be related to renal artery stenosis. This is stable from the prior exam. The osseous structures demonstrate prior right distal clavicular fracture is well as multiple bilateral rib fractures with varying degrees of healing. These posttraumatic changes are new from the prior exam. Sclerotic changes are noted in C7 and T2 which were not present in April of 2016 but present on a recent CT of the cervical spine dated 06/11/2015. These were likely post traumatic in nature.  Interval sternal fracture is noted superiorly when compared with the prior exam from April of 2016. Review of the MIP images confirms the above findings. IMPRESSION: No evidence of pulmonary emboli. Stable nodular scarring in the lingula as described above. 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 Changes consistent with prior bony trauma with bilateral rib fractures, C7 and T2 fractures, right clavicular fracture and a superior sternal fracture. Electronically Signed   By: Inez Catalina M.D.   On: 09/07/2015 10:24   Dg Chest Portable 1 View  09/07/2015  CLINICAL DATA:  64 year old female with shortness breath for the past 3 days, recently diagnosis with bronchitis. History of COPD. Dry cough and difficulty speaking. Cyanosis. History of scleroderma. EXAM: PORTABLE CHEST 1 VIEW COMPARISON:  Chest x-ray 06/12/2015. FINDINGS: Lung volumes are low and there are subtle reticular markings in the periphery of the mid to lower lungs bilaterally. Mild diffuse peribronchial cuffing. Bibasilar opacities favored to largely reflect areas of subsegmental atelectasis. No definite consolidative airspace disease. Blunting of the costophrenic sulci bilaterally may indicate trace bilateral pleural effusions, or could be technique related. Mild cephalization of the pulmonary vasculature. Heart size is mildly enlarged. The patient is rotated to the right on today's exam, resulting in distortion of the mediastinal contours and reduced diagnostic sensitivity and specificity for mediastinal pathology. Multiple healing fractures of left ribs and distal right clavicle are noted. Lucent lesion in the medial aspect of the right clavicle. Old healed fracture of the distal left clavicle again noted. IMPRESSION: 1. Cardiomegaly with cephalization of the pulmonary vasculature and diffuse prominence of interstitial markings. This is likely reflective of congestive heart failure.  2. However, the patient has low lung volumes and the reticular markings throughout the mid to lower lungs bilaterally may also be a sign of progressive interstitial lung disease, particularly in this patient with history of scleroderma. Follow-up nonemergent high-resolution chest CT is suggested in the next 2-3 weeks after complete resolution of the patient's acute illness (to ensure resolution of the edema if present) to better evaluate these findings. 3. New lucent lesion in the medial aspect of the right clavicle. 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 Electronically Signed   By: Vinnie Langton M.D.   On: 09/07/2015 08:21      ASSESSMENT/PLAN   64 yo white female with acute SOB from acute COPD exacerbation  from acute Bronchitis with acute CHF exacerbation Patient has felt better after placement of oxygen and breathing treatment  1.oxygen as needed 2.continue IV abx as prescribed 3.cont solumedrol 20 mg IV BID-change to prednisone today 4.cont BD therapy as prescribed  5.lasix as tolerated 6.follow cardiology recs 7.anti tussive meds as needed  No indication for ICU/SD monitoring at this time. Recommend Transfer to gen med floor    The Patient requires high complexity decision making for assessment and support, frequent evaluation and titration of therapies, application of advanced monitoring technologies and extensive interpretation of multiple databases.  Patient satisfied with Plan of action and management. All questions answered  Corrin Parker, M.D.  [facility]  Medical Director [place] Director [facility] Cardio-Pulmonary Department

## 2015-09-09 LAB — MAGNESIUM: MAGNESIUM: 1.2 mg/dL — AB (ref 1.7–2.4)

## 2015-09-09 LAB — BASIC METABOLIC PANEL
Anion gap: 11 (ref 5–15)
BUN: 12 mg/dL (ref 6–20)
CALCIUM: 7.2 mg/dL — AB (ref 8.9–10.3)
CO2: 24 mmol/L (ref 22–32)
CREATININE: 0.91 mg/dL (ref 0.44–1.00)
Chloride: 103 mmol/L (ref 101–111)
GFR calc non Af Amer: >60 mL/min (ref >60)
Glucose, Bld: 152 mg/dL — ABNORMAL HIGH (ref 65–99)
Potassium: 3.4 mmol/L — ABNORMAL LOW (ref 3.5–5.1)
SODIUM: 138 mmol/L (ref 135–145)

## 2015-09-09 LAB — GLUCOSE, CAPILLARY
GLUCOSE-CAPILLARY: 96 mg/dL (ref 65–99)
GLUCOSE-CAPILLARY: 294 mg/dL — AB (ref 65–99)
GLUCOSE-CAPILLARY: 90 mg/dL (ref 65–99)
Glucose-Capillary: 220 mg/dL — ABNORMAL HIGH (ref 65–99)

## 2015-09-09 MED ORDER — LEVOFLOXACIN 500 MG PO TABS
500.0000 mg | ORAL_TABLET | Freq: Every day | ORAL | Status: DC
  Administered 2015-09-10: 500 mg via ORAL
  Filled 2015-09-09: qty 1

## 2015-09-09 MED ORDER — GLUCERNA SHAKE PO LIQD
237.0000 mL | Freq: Three times a day (TID) | ORAL | Status: DC
  Administered 2015-09-09: 237 mL via ORAL

## 2015-09-09 MED ORDER — SPIRONOLACTONE 25 MG PO TABS
25.0000 mg | ORAL_TABLET | Freq: Every day | ORAL | Status: DC
  Administered 2015-09-09 – 2015-09-10 (×2): 25 mg via ORAL
  Filled 2015-09-09 (×2): qty 1

## 2015-09-09 MED ORDER — MAGNESIUM SULFATE 2 GM/50ML IV SOLN
2.0000 g | Freq: Once | INTRAVENOUS | Status: AC
  Administered 2015-09-09: 2 g via INTRAVENOUS
  Filled 2015-09-09: qty 50

## 2015-09-09 MED ORDER — OXYCODONE-ACETAMINOPHEN 5-325 MG PO TABS
1.0000 | ORAL_TABLET | Freq: Three times a day (TID) | ORAL | Status: DC | PRN
  Administered 2015-09-09 – 2015-09-10 (×4): 1 via ORAL
  Filled 2015-09-09 (×4): qty 1

## 2015-09-09 NOTE — Progress Notes (Signed)
MD Molli Hazard was notified of pain med need.

## 2015-09-09 NOTE — Progress Notes (Signed)
SUBJECTIVE: Patient had some shortness of breath   Filed Vitals:   09/08/15 1925 09/09/15 0613 09/09/15 0745 09/09/15 1140  BP: 101/52 133/73 151/77 115/86  Pulse: 67 77 88 75  Temp: 98.1 F (36.7 C) 98.2 F (36.8 C)  98 F (36.7 C)  TempSrc: Oral   Oral  Resp: 16 16 18 18   Height:      Weight:  113 lb 1.6 oz (51.302 kg)    SpO2: 100% 96% 90% 100%    Intake/Output Summary (Last 24 hours) at 09/09/15 1144 Last data filed at 09/09/15 1007  Gross per 24 hour  Intake    240 ml  Output   1300 ml  Net  -1060 ml    LABS: Basic Metabolic Panel:  Recent Labs  09/08/15 0444 09/09/15 0605  NA 136 138  K 2.6* 3.4*  CL 102 103  CO2 24 24  GLUCOSE 137* 152*  BUN 11 12  CREATININE 0.98 0.91  CALCIUM 6.5* 7.2*  MG 0.6* 1.2*   Liver Function Tests:  Recent Labs  09/07/15 0758 09/08/15 0444  AST 26 26  ALT 23 20  ALKPHOS 167* 139*  BILITOT 0.8 0.7  PROT 6.9 5.9*  ALBUMIN 2.8* 2.3*   No results for input(s): LIPASE, AMYLASE in the last 72 hours. CBC:  Recent Labs  09/07/15 0758 09/08/15 0444  WBC 11.5* 10.2  NEUTROABS 9.8*  --   HGB 11.0* 9.9*  HCT 35.3 31.4*  MCV 76.3* 76.5*  PLT 274 195   Cardiac Enzymes:  Recent Labs  09/07/15 1151 09/07/15 1822 09/08/15 1625  TROPONINI 0.44* 0.50* 0.20*   BNP: Invalid input(s): POCBNP D-Dimer: No results for input(s): DDIMER in the last 72 hours. Hemoglobin A1C: No results for input(s): HGBA1C in the last 72 hours. Fasting Lipid Panel: No results for input(s): CHOL, HDL, LDLCALC, TRIG, CHOLHDL, LDLDIRECT in the last 72 hours. Thyroid Function Tests: No results for input(s): TSH, T4TOTAL, T3FREE, THYROIDAB in the last 72 hours.  Invalid input(s): FREET3 Anemia Panel: No results for input(s): VITAMINB12, FOLATE, FERRITIN, TIBC, IRON, RETICCTPCT in the last 72 hours.   PHYSICAL EXAM General: Well developed, well nourished, in no acute distress HEENT:  Normocephalic and atramatic Neck:  No JVD.  Lungs:  Clear bilaterally to auscultation and percussion. Heart: HRRR . Normal S1 and S2 without gallops or murmurs.  Abdomen: Bowel sounds are positive, abdomen soft and non-tender  Msk:  Back normal, normal gait. Normal strength and tone for age. Extremities: No clubbing, cyanosis or edema.   Neuro: Alert and oriented X 3. Psych:  Good affect, responds appropriately  TELEMETRY: Sinus rhythm  ASSESSMENT AND PLAN: Coronary artery disease with PCI and stenting of the mid LAD with no significant restenosis. Moderate to severe disease in the left circumflex bifurcation with obtuse marginal 2, being too small for PCI. Occluded right coronary. Ejection fraction 35% to 40%. Heart rate is much better will increase Lasix K she has some crepitation at the bases.  Active Problems:   CHF (congestive heart failure) (HCC)   COPD exacerbation (HCC)   Acute bronchitis    Mariam Helbert A, MD, [facility] 09/09/2015 11:44 AM

## 2015-09-09 NOTE — Progress Notes (Signed)
ANTIBIOTIC CONSULT NOTE - INITIAL  Pharmacy Consult for Levaquin Indication: rule out pneumonia  Allergies  Allergen Reactions  . Other Itching    Most narcotics cause itching, unsure of what kinds    Patient Measurements: Height: 5' (152.4 cm) Weight: 113 lb 1.6 oz (51.302 kg) IBW/kg (Calculated) : 45.5  Vital Signs: Temp: 98.2 F (36.8 C) (12/31 0613) BP: 151/77 mmHg (12/31 0745) Pulse Rate: 88 (12/31 0745) Intake/Output from previous day: 12/30 0701 - 12/31 0700 In: 393 [P.O.:240; I.V.:3; IV Piggyback:150] Out: 1100 [Urine:1100] Intake/Output from this shift:    Labs:  Recent Labs  09/07/15 0758 09/08/15 0444 09/09/15 0605  WBC 11.5* 10.2  --   HGB 11.0* 9.9*  --   PLT 274 195  --   CREATININE 0.80 0.98 0.91   Estimated Creatinine Clearance: 44.3 mL/min (by C-G formula based on Cr of 0.91). No results for input(s): VANCOTROUGH, VANCOPEAK, VANCORANDOM, GENTTROUGH, GENTPEAK, GENTRANDOM, TOBRATROUGH, TOBRAPEAK, TOBRARND, AMIKACINPEAK, AMIKACINTROU, AMIKACIN in the last 72 hours.   Microbiology: Recent Results (from the past 720 hour(s))  MRSA PCR Screening     Status: None   Collection Time: 09/07/15 11:43 AM  Result Value Ref Range Status   MRSA by PCR NEGATIVE NEGATIVE Final    Comment:        The GeneXpert MRSA Assay (FDA approved for NASAL specimens only), is one component of a comprehensive MRSA colonization surveillance program. It is not intended to diagnose MRSA infection nor to guide or monitor treatment for MRSA infections.     Medical History: Past Medical History  Diagnosis Date  . CHF (congestive heart failure) ([place])   . Hypertension   . Coronary artery disease   . Renal insufficiency   . Scleroderma ([place])   . Chronic back pain   . GERD (gastroesophageal reflux disease)   . Blue skin   . COPD (chronic obstructive pulmonary disease) ([place])   . Raynaud disease   . Shortness of breath dyspnea   . Anginal pain ([place])   . Edema   .  Hyperlipemia   . Ovarian cancer ([place])     chemo/rad  . 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 Vulvar cancer ([place])     Medications:  Scheduled:  . amitriptyline  25 mg Oral Daily  . aspirin EC  81 mg Oral Daily  . carvedilol  25 mg Oral BID WC  . docusate sodium  100 mg Oral BID  . enoxaparin (LOVENOX) injection  40 mg Subcutaneous Q24H  . furosemide  40 mg Intravenous BID  . insulin aspart  0-15 Units Subcutaneous TID WC  . insulin aspart  0-5 Units Subcutaneous QHS  . ivabradine  5 mg Oral BID WC  . levofloxacin (LEVAQUIN) IV  500 mg Intravenous Q24H  . metoprolol succinate  50 mg Oral Daily  . pantoprazole  40 mg Oral BID  . simvastatin  40 mg Oral BH-q7a  . sodium chloride  3 mL Intravenous Q12H  . sodium chloride  3 mL Intravenous Q12H  . ticagrelor  90 mg Oral BID   Infusions:    Assessment: 64 y/o F admitted with respiratory failure with acute on chronic CHF and possible PNA.   CrCl= 44 ml/min  Plan:  Leavqiun 750 mg iv daily ordered on day one, followed by levofloxacin 500mg  IV daily. CrCl <25ml/min so will continue to levofloxacin 500mg  daily. Patient is taking other PO medications and has diet ordered, will change Abx to PO  Stop date for 1/4 was  placed for levofloxacin on 12/30.   Nancy Fetter, PharmD Pharmacy Resident  09/09/2015,10:04 AM

## 2015-09-09 NOTE — Progress Notes (Signed)
Initial Nutrition Assessment   INTERVENTION:   Meals and Snacks: Cater to patient preferences; pt would likely benefit from Heart/Carb Modified diet order once po intake improved Medical Food Supplement Therapy: will send Glucerna Shake po TID, each supplement provides 220 kcal and 10 grams of protein    NUTRITION DIAGNOSIS:   Inadequate oral intake related to acute illness as evidenced by meal completion < 50%, per patient/family report.  GOAL:   Patient will meet greater than or equal to 90% of their needs  MONITOR:    (Energy Intake, Glucose Profile, Electrolyte and renal Profile, Anthropometrics)  REASON FOR ASSESSMENT:   Diagnosis, Malnutrition Screening Tool    ASSESSMENT:   Pt admitted with SOB secondary to COPD exacerbation, acute bronchitis and CHF exacerbation. MD in with pt on rounds.  Past Medical History  Diagnosis Date  . CHF (congestive heart failure) ([place])   . Hypertension   . Coronary artery disease   . Renal insufficiency   . Scleroderma ([place])   . Chronic back pain   . GERD (gastroesophageal reflux disease)   . Blue skin   . COPD (chronic obstructive pulmonary disease) ([place])   . Raynaud disease   . Shortness of breath dyspnea   . Anginal pain ([place])   . Edema   . Hyperlipemia   . Ovarian cancer ([place])     chemo/rad  . Vaginal cancer ([place])   . Vulvar cancer ([place])      Diet Order:  Diet Carb Modified Fluid consistency:: Thin; Room service appropriate?: Yes    Current Nutrition: Pt eating bites of breakfast per documentation this am.  Food/Nutrition-Related History: Pt with decreased appetite PTA per MST   Scheduled Medications:  . amitriptyline  25 mg Oral Daily  . aspirin EC  81 mg Oral Daily  . carvedilol  25 mg Oral BID WC  . docusate sodium  100 mg Oral BID  . enoxaparin (LOVENOX) injection  40 mg Subcutaneous Q24H  . furosemide  40 mg Intravenous BID  . insulin aspart  0-15 Units Subcutaneous TID WC  . insulin aspart  0-5 Units  Subcutaneous QHS  . ivabradine  5 mg Oral BID WC  . [START ON 09/10/2015] levofloxacin  500 mg Oral Daily  . magnesium sulfate 1 - 4 g bolus IVPB  2 g Intravenous Once  . metoprolol succinate  50 mg Oral Daily  . pantoprazole  40 mg Oral BID  . simvastatin  40 mg Oral [id number]  . sodium chloride  3 mL Intravenous Q12H  . sodium chloride  3 mL Intravenous Q12H  . spironolactone  25 mg Oral Daily  . ticagrelor  90 mg Oral BID      Electrolyte/Renal Profile and Glucose Profile:   Recent Labs Lab 09/07/15 0758 09/08/15 0444 09/09/15 0605  NA 137 136 138  K 2.8* 2.6* 3.4*  CL 102 102 103  CO2 23 24 24   BUN 8 11 12   CREATININE 0.80 0.98 0.91  CALCIUM 6.5* 6.5* 7.2*  MG  --  0.6* 1.2*  GLUCOSE 251* 137* 152*   Protein Profile:  Recent Labs Lab 09/07/15 0758 09/08/15 0444  ALBUMIN 2.8* 2.3*    Gastrointestinal Profile: Last BM:  09/08/2015   Nutrition-Focused Physical Exam Findings:  Unable to complete Nutrition-Focused physical exam at this time.    Weight Change: Per CHL encounters weight loss of 13% in past 2-4 months.   Skin:  Reviewed, no issues   Height:   Ht Readings from Last  1 Encounters:  09/07/15 5' (1.524 m)    Weight:   Wt Readings from Last 1 Encounters:  09/09/15 113 lb 1.6 oz (51.302 kg)   Wt Readings from Last 10 Encounters:  09/09/15 113 lb 1.6 oz (51.302 kg)  06/20/15 130 lb (58.968 kg)  06/15/15 132 lb (59.875 kg)  06/11/15 123 lb (55.792 kg)  04/26/15 131 lb 4.5 oz (59.55 kg)  01/10/15 124 lb (56.246 kg)  12/14/14 121 lb (54.885 kg)     BMI:  Body mass index is 22.09 kg/(m^2).   Estimated Nutritional Needs:   Kcal:  BEE: 976kcals, TEE: (IF 1.1-1.3)(AF 1.2) 1288-1523kcals  Protein:  51-61g protein (1.0-1.2g/kg)  Fluid:  1275-1543mL of fluid (25-22mL/kg)   EDUCATION NEEDS:   No education needs identified at this time   [place], RD, LDN Pager [phone] Weekend/On-Call Pager [phone]

## 2015-09-09 NOTE — Progress Notes (Signed)
2.5 L of oyxgen. NSR. Takes meds ok. Pt reported pain and received percocet. A & O. Up to [facility]. IV abx. FS are stable. EF 45%. Pt has no further concerns at this time.

## 2015-09-10 LAB — GLUCOSE, CAPILLARY
GLUCOSE-CAPILLARY: 200 mg/dL — AB (ref 65–99)
Glucose-Capillary: 158 mg/dL — ABNORMAL HIGH (ref 65–99)

## 2015-09-10 LAB — BASIC METABOLIC PANEL
Anion gap: 8 (ref 5–15)
BUN: 12 mg/dL (ref 6–20)
CO2: 27 mmol/L (ref 22–32)
CREATININE: 1.05 mg/dL — AB (ref 0.44–1.00)
Calcium: 7.8 mg/dL — ABNORMAL LOW (ref 8.9–10.3)
Chloride: 99 mmol/L — ABNORMAL LOW (ref 101–111)
GFR calc Af Amer: >60 mL/min (ref >60)
GFR, EST NON AFRICAN AMERICAN: 55 mL/min — AB (ref >60)
GLUCOSE: 114 mg/dL — AB (ref 65–99)
Potassium: 3.4 mmol/L — ABNORMAL LOW (ref 3.5–5.1)
SODIUM: 134 mmol/L — AB (ref 135–145)

## 2015-09-10 MED ORDER — OXYCODONE-ACETAMINOPHEN 5-325 MG PO TABS: 1.0000 | ORAL_TABLET | Freq: Three times a day (TID) | ORAL | Status: DC | PRN

## 2015-09-10 MED ORDER — POTASSIUM CHLORIDE CRYS ER 20 MEQ PO TBCR
40.0000 meq | EXTENDED_RELEASE_TABLET | Freq: Two times a day (BID) | ORAL | Status: DC
  Administered 2015-09-10: 40 meq via ORAL
  Filled 2015-09-10: qty 2

## 2015-09-10 MED ORDER — TICAGRELOR 90 MG PO TABS
90.0000 mg | ORAL_TABLET | Freq: Two times a day (BID) | ORAL | Status: DC
Start: 2015-09-10 — End: 2016-01-03

## 2015-09-10 MED ORDER — METOPROLOL SUCCINATE ER 50 MG PO TB24: 50.0000 mg | ORAL_TABLET | Freq: Every day | ORAL | Status: DC

## 2015-09-10 MED ORDER — POTASSIUM CHLORIDE ER 10 MEQ PO TBCR: 10.0000 meq | EXTENDED_RELEASE_TABLET | Freq: Every day | ORAL | Status: DC

## 2015-09-10 MED ORDER — ALPRAZOLAM 0.25 MG PO TABS: 0.2500 mg | ORAL_TABLET | Freq: Three times a day (TID) | ORAL | Status: AC | PRN

## 2015-09-10 MED ORDER — SPIRONOLACTONE 25 MG PO TABS: 25.0000 mg | ORAL_TABLET | Freq: Every day | ORAL | Status: DC

## 2015-09-10 MED ORDER — ALBUTEROL SULFATE HFA 108 (90 BASE) MCG/ACT IN AERS: 2.0000 | INHALATION_SPRAY | Freq: Four times a day (QID) | RESPIRATORY_TRACT | Status: DC | PRN

## 2015-09-10 MED ORDER — MAGNESIUM SULFATE 2 GM/50ML IV SOLN
2.0000 g | Freq: Once | INTRAVENOUS | Status: AC
  Administered 2015-09-10: 2 g via INTRAVENOUS
  Filled 2015-09-10: qty 50

## 2015-09-10 MED ORDER — LEVOFLOXACIN 250 MG PO TABS: 250.0000 mg | ORAL_TABLET | Freq: Every day | ORAL | Status: DC

## 2015-09-10 MED ORDER — FUROSEMIDE 20 MG PO TABS: 20.0000 mg | ORAL_TABLET | Freq: Two times a day (BID) | ORAL | Status: DC

## 2015-09-10 MED ORDER — LEVOFLOXACIN 500 MG PO TABS: 250.0000 mg | ORAL_TABLET | Freq: Every day | ORAL | Status: DC

## 2015-09-10 NOTE — Progress Notes (Signed)
Patient received discharge instructions, pt verbalized understanding. IV was removed with no signs of infection. Dressing clean, dry intact. No skin tears or wounds present. Prescription was printed and given to patient. Patient was escorted out with staff member via wheelchair via private auto. No further needs from care management team.  

## 2015-09-10 NOTE — Progress Notes (Signed)
[place] at [place] NAME: Cheryl Hamilton    MR#:  [id number]  DATE OF BIRTH:  [birth year]  SUBJECTIVE:  CHIEF COMPLAINT:   Chief Complaint  Patient presents with  . Shortness of Breath     Was in stepdown unit- now she is on telemetry floor- feels SOB.   Have some chest tightness also.  Oxygen saturation is satisfactory.  able to taper oxygen, have anxiety episodes in between.  feels dizzi on standing up, but said- this is chronic for her.  REVIEW OF SYSTEMS:  CONSTITUTIONAL: No fever, fatigue or weakness.  EYES: No blurred or double vision.  EARS, NOSE, AND THROAT: No tinnitus or ear pain.  RESPIRATORY: No cough,positive for shortness of breath,  No wheezing or hemoptysis.  CARDIOVASCULAR: No chest pain, orthopnea, edema.  GASTROINTESTINAL: No nausea, vomiting, diarrhea or abdominal pain.  GENITOURINARY: No dysuria, hematuria.  ENDOCRINE: No polyuria, nocturia,  HEMATOLOGY: No anemia, easy bruising or bleeding SKIN: No rash or lesion. MUSCULOSKELETAL: No joint pain or arthritis.   NEUROLOGIC: No tingling, numbness, weakness.  PSYCHIATRY: No anxiety or depression.   ROS  DRUG ALLERGIES:   Allergies  Allergen Reactions  . Other Itching    Most narcotics cause itching, unsure of what kinds    VITALS:  Blood pressure 123/66, pulse 56, temperature 98.3 F (36.8 C), temperature source Oral, resp. rate 16, height 5' (1.524 m), weight 51.03 kg (112 lb 8 oz), SpO2 100 %.  PHYSICAL EXAMINATION:  GENERAL:  65 y.o.-year-old patient lying in the bed with no acute respi  distress.  EYES: Pupils equal, round, reactive to light and accommodation. No scleral icterus. Extraocular muscles intact.  HEENT: Head atraumatic, normocephalic. Oropharynx and nasopharynx clear.  NECK:  Supple, no jugular venous distention. No thyroid enlargement, no tenderness.  LUNGS: Normal breath sounds bilaterally, no wheezing, few b/l crepitation.  Positive use of accessory muscles of respiration. Pt appears cyanotic allover her skin. CARDIOVASCULAR: S1, S2 normal. No murmurs, rubs, or gallops.  ABDOMEN: Soft, nontender, nondistended. Bowel sounds present. No organomegaly or mass.  EXTREMITIES: No pedal edema,positive for cyanosis, no or clubbing.  NEUROLOGIC: Cranial nerves II through XII are intact. Muscle strength 5/5 in all extremities. Sensation intact. Gait not checked.  PSYCHIATRIC: The patient is alert and oriented x 3.  SKIN: No obvious rash, lesion, or ulcer. Skin is dull and bluish colour.  Physical Exam LABORATORY PANEL:   CBC  Recent Labs Lab 09/08/15 0444  WBC 10.2  HGB 9.9*  HCT 31.4*  PLT 195   ------------------------------------------------------------------------------------------------------------------  Chemistries   Recent Labs Lab 09/08/15 0444 09/09/15 0605 09/10/15 0539  NA 136 138 134*  K 2.6* 3.4* 3.4*  CL 102 103 99*  CO2 24 24 27   GLUCOSE 137* 152* 114*  BUN 11 12 12   CREATININE 0.98 0.91 1.05*  CALCIUM 6.5* 7.2* 7.8*  MG 0.6* 1.2*  --   AST 26  --   --   ALT 20  --   --   ALKPHOS 139*  --   --   BILITOT 0.7  --   --    ------------------------------------------------------------------------------------------------------------------  Cardiac Enzymes  Recent Labs Lab 09/07/15 1822 09/08/15 1625  TROPONINI 0.50* 0.20*   ------------------------------------------------------------------------------------------------------------------  RADIOLOGY:  No results found.  ASSESSMENT AND PLAN:   Active Problems:   CHF (congestive heart failure) (HCC)   COPD exacerbation (HCC)   Acute bronchitis   * Acute on chronic systolic and diastolic chf -  IV Lasix - Input and Output - Counseled to limit fluids and Salt - Monitor Bun/Cr and Potassium - Echo reviewed- EF is 40%- decreased from recent echo with normal EF. -Appreciated Cardiology consult - increased lasix due to  crepitation today.  * Acute hypoxic resp failure O2. Wean as tolerated Nebs PRN Appreciated Consult pulmonary - likely bronchitis. COnt IV Abx.  * Elevated troponin - Likely Demand ischemia Stable on Trend troponin. Appreciated Consult cardiology Checked Echo  * ST vs MAT Cardiology consult IV cardizem PRN  HR stable now.  * Hypokalemia replace PO- stable now.  * hypomagnesemia   Replace IV.  * HTN Hold meds due to boderline low BP  * DM SSI, ADA  * DVT prophylaxis Lovenox   All the records are reviewed and case discussed with Care Management/Social Workerr. Management plans discussed with the patient, family and they are in agreement.  CODE STATUS: Full.  TOTAL TIME TAKING CARE OF THIS PATIENT: 35 minutes.   POSSIBLE D/C IN 1-2 DAYS, DEPENDING ON CLINICAL CONDITION.   Vaughan Basta M.D on 09/10/2015   Between 7am to 6pm - Pager - [phone]  After 6pm go to [email or web address] - password [email or web address] [facility]  Office  [phone]  CC: Primary care physician; [facility], Chrissie Noa, MD  Note: This dictation was prepared with Dragon dictation along with smaller phrase technology. Any transcriptional errors that result from this process are unintentional.

## 2015-09-10 NOTE — Discharge Summary (Signed)
[place] at [place] NAME: Cheryl Hamilton    MR#:  [id number]  DATE OF BIRTH:  September 13, 1950  DATE OF ADMISSION:  09/07/2015 ADMITTING PHYSICIAN: Hillary Bow, MD  DATE OF DISCHARGE: 09/10/2015  PRIMARY CARE PHYSICIAN: FELDPAUSCH, DALE E, MD    ADMISSION DIAGNOSIS:  Dyspnea [R06.00] Tachycardia [R00.0] Hypoxia [R09.02] Elevated troponin [R79.89] Elevated lactic acid level [E87.2]  DISCHARGE DIAGNOSIS:  Active Problems:   CHF (congestive heart failure) (HCC)   COPD exacerbation (HCC)   Acute bronchitis   SECONDARY DIAGNOSIS:   Past Medical History  Diagnosis Date  . CHF (congestive heart failure) ([place])   . Hypertension   . Coronary artery disease   . Renal insufficiency   . Scleroderma ([place])   . Chronic back pain   . GERD (gastroesophageal reflux disease)   . Blue skin   . COPD (chronic obstructive pulmonary disease) ([place])   . Raynaud disease   . Shortness of breath dyspnea   . Anginal pain ([place])   . Edema   . Hyperlipemia   . Ovarian cancer ([place])     chemo/rad  . Vaginal cancer ([place])   . Vulvar cancer [facility])     HOSPITAL COURSE:   * Acute on chronic systolic and diastolic chf - IV Lasix - Input and Output - Counseled to limit fluids and Salt - Monitor Bun/Cr and Potassium - Echo reviewed- EF is 40%- decreased from recent echo with normal EF. -Appreciated Cardiology consult - increased lasix due to crepitation - felt better- able to walk without additional oxygen suopport.  * Acute hypoxic resp failure O2. Wean as tolerated Nebs PRN Appreciated Consult pulmonary - likely bronchitis. COnt IV Abx.  on room air now.  * Elevated troponin - Likely Demand ischemia Stable on Trend troponin. Appreciated Consult cardiology Checked Echo  * ST vs MAT Cardiology consult IV cardizem PRN HR stable now.  * Hypokalemia replace PO- stable now.  * hypomagnesemia  Replace IV.  * HTN Hold meds due to  boderline low BP  * DM SSI, ADA  * DVT prophylaxis Lovenox  DISCHARGE CONDITIONS:  Stable.  CONSULTS OBTAINED:  Treatment Team:  Dionisio David, MD  DRUG ALLERGIES:   Allergies  Allergen Reactions  . Other Itching    Most narcotics cause itching, unsure of what kinds    DISCHARGE MEDICATIONS:   Current Discharge Medication List    START taking these medications   Details  !! albuterol (VENTOLIN HFA) 108 (90 Base) MCG/ACT inhaler Inhale 2 puffs into the lungs every 6 (six) hours as needed for wheezing or shortness of breath. Qty: 1 Inhaler, Refills: 2    ALPRAZolam (XANAX) 0.25 MG tablet Take 1 tablet (0.25 mg total) by mouth 3 (three) times daily as needed for anxiety. Qty: 24 tablet, Refills: 0    levofloxacin (LEVAQUIN) 250 MG tablet Take 1 tablet (250 mg total) by mouth daily. Qty: 3 tablet, Refills: 0    metoprolol succinate (TOPROL-XL) 50 MG 24 hr tablet Take 1 tablet (50 mg total) by mouth daily. Take with or immediately following a meal. Qty: 30 tablet, Refills: 0    oxyCODONE-acetaminophen (PERCOCET/ROXICET) 5-325 MG tablet Take 1 tablet by mouth every 8 (eight) hours as needed for severe pain. Qty: 30 tablet, Refills: 0    potassium chloride (K-DUR) 10 MEQ tablet Take 1 tablet (10 mEq total) by mouth daily. Qty: 30 tablet, Refills: 0    spironolactone (ALDACTONE) 25 MG tablet Take  1 tablet (25 mg total) by mouth daily. Qty: 30 tablet, Refills: 0    ticagrelor (BRILINTA) 90 MG TABS tablet Take 1 tablet (90 mg total) by mouth 2 (two) times daily. Qty: 60 tablet, Refills: 0     !! - Potential duplicate medications found. Please discuss with provider.    CONTINUE these medications which have CHANGED   Details  furosemide (LASIX) 20 MG tablet Take 1 tablet (20 mg total) by mouth 2 (two) times daily. Qty: 60 tablet, Refills: 0      CONTINUE these medications which have NOT CHANGED   Details  acetaminophen (TYLENOL) 325 MG tablet Take 325 mg by mouth  every 4 (four) hours as needed for mild pain, moderate pain or fever.    !! albuterol (PROVENTIL HFA;VENTOLIN HFA) 108 (90 BASE) MCG/ACT inhaler Inhale 2 puffs into the lungs every 6 (six) hours as needed for wheezing or shortness of breath.    amitriptyline (ELAVIL) 25 MG tablet Take 1 tablet by mouth daily. Refills: 3    aspirin EC 81 MG tablet Take 81 mg by mouth daily.    benzonatate (TESSALON) 100 MG capsule Take 100 mg by mouth 3 (three) times daily as needed for cough.  Refills: 99    carvedilol (COREG) 25 MG tablet Take 25 mg by mouth 2 (two) times daily with a meal.    pantoprazole (PROTONIX) 40 MG tablet Take 1 tablet by mouth 2 (two) times daily.    simvastatin (ZOCOR) 40 MG tablet Take 40 mg by mouth every morning.  Refills: 5     !! - Potential duplicate medications found. Please discuss with provider.    STOP taking these medications     cefdinir (OMNICEF) 300 MG capsule      clopidogrel (PLAVIX) 75 MG tablet      ivabradine (CORLANOR) 5 MG TABS tablet      losartan (COZAAR) 25 MG tablet      ondansetron (ZOFRAN) 4 MG tablet          DISCHARGE INSTRUCTIONS:    Follow with PMD in 2 weeks.  If you experience worsening of your admission symptoms, develop shortness of breath, life threatening emergency, suicidal or homicidal thoughts you must seek medical attention immediately by calling 911 or calling your MD immediately  if symptoms less severe.  You Must read complete instructions/literature along with all the possible adverse reactions/side effects for all the Medicines you take and that have been prescribed to you. Take any new Medicines after you have completely understood and accept all the possible adverse reactions/side effects.   Please note  You were cared for by a hospitalist during your hospital stay. If you have any questions about your discharge medications or the care you received while you were in the hospital after you are discharged, you  can call the unit and asked to speak with the hospitalist on call if the hospitalist that took care of you is not available. Once you are discharged, your primary care physician will handle any further medical issues. Please note that NO REFILLS for any discharge medications will be authorized once you are discharged, as it is imperative that you return to your primary care physician (or establish a relationship with a primary care physician if you do not have one) for your aftercare needs so that they can reassess your need for medications and monitor your lab values.    Today   CHIEF COMPLAINT:   Chief Complaint  Patient presents with  .  Shortness of Breath    HISTORY OF PRESENT ILLNESS:  Cheryl Hamilton  is a 65 y.o. female with a known history of CHF, Subarachnoid bleed(06/2015), HTN , DM here with 2 days SOB. Orthopnea. Sputum-colorless. Saw here PCP and is on treatment for Acute bronchitis. Also has chest heaviness. Troponin 0.29. CXR shows CHF. Received 1 dose IV lasix in ED. EKG showed ST vs MAT in ED at 130s. Received Lopressor IV. Has h/o scleroderma and bluish discoloration of skin due to meds.   VITAL SIGNS:  Blood pressure 123/66, pulse 56, temperature 98.3 F (36.8 C), temperature source Oral, resp. rate 16, height 5' (1.524 m), weight 51.03 kg (112 lb 8 oz), SpO2 100 %.  I/O:   Intake/Output Summary (Last 24 hours) at 09/10/15 1306 Last data filed at 09/10/15 1239  [place] per 24 hour  Intake    530 ml  Output   2050 ml  Net  -1520 ml    PHYSICAL EXAMINATION:   GENERAL: 65 y.o.-year-old patient lying in the bed with no acute respi distress.  EYES: Pupils equal, round, reactive to light and accommodation. No scleral icterus. Extraocular muscles intact.  HEENT: Head atraumatic, normocephalic. Oropharynx and nasopharynx clear.  NECK: Supple, no jugular venous distention. No thyroid enlargement, no tenderness.  LUNGS: Normal breath sounds bilaterally, no  wheezing, few b/l crepitation. Positive use of accessory muscles of respiration. Pt appears cyanotic allover her skin. CARDIOVASCULAR: S1, S2 normal. No murmurs, rubs, or gallops.  ABDOMEN: Soft, nontender, nondistended. Bowel sounds present. No organomegaly or mass.  EXTREMITIES: No pedal edema,positive for cyanosis, no or clubbing.  NEUROLOGIC: Cranial nerves II through XII are intact. Muscle strength 5/5 in all extremities. Sensation intact. Gait not checked.  PSYCHIATRIC: The patient is alert and oriented x 3.  SKIN: No obvious rash, lesion, or ulcer. Skin is dull and bluish colour.  DATA REVIEW:   CBC  Recent Labs Lab 09/08/15 0444  WBC 10.2  HGB 9.9*  HCT 31.4*  PLT 195    Chemistries   Recent Labs Lab 09/08/15 0444 09/09/15 0605 09/10/15 0539  NA 136 138 134*  K 2.6* 3.4* 3.4*  CL 102 103 99*  CO2 24 24 27   GLUCOSE 137* 152* 114*  BUN 11 12 12   CREATININE 0.98 0.91 1.05*  CALCIUM 6.5* 7.2* 7.8*  MG 0.6* 1.2*  --   AST 26  --   --   ALT 20  --   --   ALKPHOS 139*  --   --   BILITOT 0.7  --   --     Cardiac Enzymes  Recent Labs Lab 09/08/15 1625  TROPONINI 0.20*    Microbiology Results  Results for orders placed or performed during the hospital encounter of 09/07/15  MRSA PCR Screening     Status: None   Collection Time: 09/07/15 11:43 AM  Result Value Ref Range Status   MRSA by PCR NEGATIVE NEGATIVE Final    Comment:        The GeneXpert MRSA Assay (FDA approved for NASAL specimens only), is one component of a comprehensive MRSA colonization surveillance program. It is not intended to diagnose MRSA infection nor to guide or monitor treatment for MRSA infections.     RADIOLOGY:  No results found.  EKG:   Orders placed or performed during the hospital encounter of 09/07/15  . ED EKG  . ED EKG  . EKG 12-Lead  . EKG 12-Lead      Management plans discussed with  the patient, family and they are in agreement.  CODE STATUS:      Code Status Orders        Start     Ordered   09/07/15 0935  Full code   Continuous     09/07/15 0936      TOTAL TIME TAKING CARE OF THIS PATIENT: 35 minutes.    Vaughan Basta M.D on 09/10/2015 at 1:06 PM  Between 7am to 6pm - Pager - [phone]  After 6pm go to [email or web address] - password [email or web address] [facility]  Office  [phone]  CC: Primary care physician; [facility], Chrissie Noa, MD   Note: This dictation was prepared with Dragon dictation along with smaller phrase technology. Any transcriptional errors that result from this process are unintentional.

## 2015-09-10 NOTE — Progress Notes (Signed)
A&O. UP with one assist. 2.5 L O2. Percocet given for pain. Slept well through the night. No s/s distress.

## 2015-09-10 NOTE — Progress Notes (Signed)
SATURATION QUALIFICATIONS: (This note is used to comply with regulatory documentation for home oxygen)  Patient Saturations on Room Air at Rest = 99%  Patient Saturations on Room Air while Ambulating = 99%   Please briefly explain why patient needs home oxygen:

## 2015-09-10 NOTE — Progress Notes (Signed)
SUBJECTIVE: Feeling much better   Filed Vitals:   09/10/15 0803 09/10/15 0958 09/10/15 1128 09/10/15 1205  BP: 118/61 124/62  123/66  Pulse: 64 62  56  Temp:    98.3 F (36.8 C)  TempSrc:    Oral  Resp:    16  Height:      Weight:      SpO2:   99% 100%    Intake/Output Summary (Last 24 hours) at 09/10/15 1304 Last data filed at 09/10/15 1239  [place] per 24 hour  Intake    530 ml  Output   2050 ml  Net  -1520 ml    LABS: Basic Metabolic Panel:  Recent Labs  09/08/15 0444 09/09/15 0605 09/10/15 0539  NA 136 138 134*  K 2.6* 3.4* 3.4*  CL 102 103 99*  CO2 24 24 27   GLUCOSE 137* 152* 114*  BUN 11 12 12   CREATININE 0.98 0.91 1.05*  CALCIUM 6.5* 7.2* 7.8*  MG 0.6* 1.2*  --    Liver Function Tests:  Recent Labs  09/08/15 0444  AST 26  ALT 20  ALKPHOS 139*  BILITOT 0.7  PROT 5.9*  ALBUMIN 2.3*   No results for input(s): LIPASE, AMYLASE in the last 72 hours. CBC:  Recent Labs  09/08/15 0444  WBC 10.2  HGB 9.9*  HCT 31.4*  MCV 76.5*  PLT 195   Cardiac Enzymes:  Recent Labs  09/07/15 1822 09/08/15 1625  TROPONINI 0.50* 0.20*   BNP: Invalid input(s): POCBNP D-Dimer: No results for input(s): DDIMER in the last 72 hours. Hemoglobin A1C: No results for input(s): HGBA1C in the last 72 hours. Fasting Lipid Panel: No results for input(s): CHOL, HDL, LDLCALC, TRIG, CHOLHDL, LDLDIRECT in the last 72 hours. Thyroid Function Tests: No results for input(s): TSH, T4TOTAL, T3FREE, THYROIDAB in the last 72 hours.  Invalid input(s): FREET3 Anemia Panel: No results for input(s): VITAMINB12, FOLATE, FERRITIN, TIBC, IRON, RETICCTPCT in the last 72 hours.   PHYSICAL EXAM General: Well developed, well nourished, in no acute distress HEENT:  Normocephalic and atramatic Neck:  No JVD.  Lungs: Clear bilaterally to auscultation and percussion. Heart: HRRR . Normal S1 and S2 without gallops or murmurs.  Abdomen: Bowel sounds are positive, abdomen soft and  non-tender  Msk:  Back normal, normal gait. Normal strength and tone for age. Extremities: No clubbing, cyanosis or edema.   Neuro: Alert and oriented X 3. Psych:  Good affect, responds appropriately  TELEMETRY: Sinus rhythm  ASSESSMENT AND PLAN: Severe LV dysfunction with coronary artery disease and CHF. Patient has clinically improved with current medications agree with discharging the patient with follow-up in the office this Thursday at 10:00.  Active Problems:   CHF (congestive heart failure) (HCC)   COPD exacerbation (HCC)   Acute bronchitis    Gurvir Schrom A, MD, [facility] 09/10/2015 1:04 PM

## 2015-09-10 NOTE — Progress Notes (Signed)
ANTIBIOTIC CONSULT NOTE - [place] for Levaquin Indication: rule out pneumonia  Allergies  Allergen Reactions  . Other Itching    Most narcotics cause itching, unsure of what kinds    Patient Measurements: Height: 5' (152.4 cm) Weight: 112 lb 8 oz (51.03 kg) IBW/kg (Calculated) : 45.5  Vital Signs: Temp: 97.9 F (36.6 C) (01/01 0420) Temp Source: Oral (01/01 0420) BP: 124/62 mmHg (01/01 0958) Pulse Rate: 62 (01/01 0958) Intake/Output from previous day: 12/31 0701 - 01/01 0700 In: 480 [P.O.:480] Out: 2000 [Urine:2000] Intake/Output from this shift: Total I/O In: 240 [P.O.:240] Out: 450 [Urine:450]  Labs:  Recent Labs  09/08/15 0444 09/09/15 0605 09/10/15 0539  WBC 10.2  --   --   HGB 9.9*  --   --   PLT 195  --   --   CREATININE 0.98 0.91 1.05*   Estimated Creatinine Clearance: 38.4 mL/min (by C-G formula based on Cr of 1.05). No results for input(s): VANCOTROUGH, VANCOPEAK, VANCORANDOM, GENTTROUGH, GENTPEAK, GENTRANDOM, TOBRATROUGH, TOBRAPEAK, TOBRARND, AMIKACINPEAK, AMIKACINTROU, AMIKACIN in the last 72 hours.   Microbiology: Recent Results (from the past 720 hour(s))  MRSA PCR Screening     Status: None   Collection Time: 09/07/15 11:43 AM  Result Value Ref Range Status   MRSA by PCR NEGATIVE NEGATIVE Final    Comment:        The GeneXpert MRSA Assay (FDA approved for NASAL specimens only), is one component of a comprehensive MRSA colonization surveillance program. It is not intended to diagnose MRSA infection nor to guide or monitor treatment for MRSA infections.     Medical History: Past Medical History  Diagnosis Date  . CHF (congestive heart failure) ([place])   . Hypertension   . Coronary artery disease   . Renal insufficiency   . Scleroderma ([place])   . Chronic back pain   . GERD (gastroesophageal reflux disease)   . Blue skin   . COPD (chronic obstructive pulmonary disease) ([place])   . Raynaud disease   . Shortness of  breath dyspnea   . Anginal pain ([place])   . Edema   . Hyperlipemia   . Ovarian cancer ([place])     chemo/rad  . 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 Vulvar cancer ([place])     Medications:  Scheduled:  . amitriptyline  25 mg Oral Daily  . aspirin EC  81 mg Oral Daily  . carvedilol  25 mg Oral BID WC  . docusate sodium  100 mg Oral BID  . enoxaparin (LOVENOX) injection  40 mg Subcutaneous Q24H  . feeding supplement (GLUCERNA SHAKE)  237 mL Oral TID WC  . furosemide  40 mg Intravenous BID  . insulin aspart  0-15 Units Subcutaneous TID WC  . insulin aspart  0-5 Units Subcutaneous QHS  . ivabradine  5 mg Oral BID WC  . [START ON 09/11/2015] levofloxacin  250 mg Oral Daily  . magnesium sulfate 1 - 4 g bolus IVPB  2 g Intravenous Once  . metoprolol succinate  50 mg Oral Daily  . pantoprazole  40 mg Oral BID  . potassium chloride  40 mEq Oral BID  . simvastatin  40 mg Oral [id number]  . sodium chloride  3 mL Intravenous Q12H  . sodium chloride  3 mL Intravenous Q12H  . spironolactone  25 mg Oral Daily  . ticagrelor  90 mg Oral BID   Infusions:    Assessment: 65 y/o F admitted with respiratory failure  with acute on chronic CHF and possible PNA.   CrCl= 38  ml/min  Plan:  Will change to Levofloxacin 250 mg PO q24 hours due to renal function. Pharmacy to follow and adjust dose as needed.   Satomi Buda D 09/10/2015,11:06 AM

## 2015-09-29 ENCOUNTER — Encounter: Payer: Self-pay | Admitting: Family

## 2015-09-29 ENCOUNTER — Ambulatory Visit: Payer: Medicare Other | Attending: Family | Admitting: Family

## 2015-09-29 VITALS — BP 147/74 | HR 84 | Resp 20 | Ht 60.0 in | Wt 111.0 lb

## 2015-09-29 DIAGNOSIS — I959 Hypotension, unspecified: Secondary | ICD-10-CM | POA: Insufficient documentation

## 2015-09-29 DIAGNOSIS — E785 Hyperlipidemia, unspecified: Secondary | ICD-10-CM | POA: Insufficient documentation

## 2015-09-29 DIAGNOSIS — Z8544 Personal history of malignant neoplasm of other female genital organs: Secondary | ICD-10-CM | POA: Diagnosis not present

## 2015-09-29 DIAGNOSIS — I951 Orthostatic hypotension: Secondary | ICD-10-CM

## 2015-09-29 DIAGNOSIS — G8929 Other chronic pain: Secondary | ICD-10-CM | POA: Diagnosis not present

## 2015-09-29 DIAGNOSIS — Z79899 Other long term (current) drug therapy: Secondary | ICD-10-CM | POA: Insufficient documentation

## 2015-09-29 DIAGNOSIS — J449 Chronic obstructive pulmonary disease, unspecified: Secondary | ICD-10-CM | POA: Diagnosis not present

## 2015-09-29 DIAGNOSIS — I5022 Chronic systolic (congestive) heart failure: Secondary | ICD-10-CM | POA: Insufficient documentation

## 2015-09-29 DIAGNOSIS — K219 Gastro-esophageal reflux disease without esophagitis: Secondary | ICD-10-CM | POA: Insufficient documentation

## 2015-09-29 DIAGNOSIS — Z8543 Personal history of malignant neoplasm of ovary: Secondary | ICD-10-CM | POA: Insufficient documentation

## 2015-09-29 DIAGNOSIS — I2510 Atherosclerotic heart disease of native coronary artery without angina pectoris: Secondary | ICD-10-CM | POA: Diagnosis not present

## 2015-09-29 DIAGNOSIS — I7300 Raynaud's syndrome without gangrene: Secondary | ICD-10-CM | POA: Insufficient documentation

## 2015-09-29 DIAGNOSIS — Z87891 Personal history of nicotine dependence: Secondary | ICD-10-CM | POA: Diagnosis not present

## 2015-09-29 DIAGNOSIS — Z7982 Long term (current) use of aspirin: Secondary | ICD-10-CM | POA: Diagnosis not present

## 2015-09-29 NOTE — Progress Notes (Signed)
Subjective:    Patient ID: Cheryl Hamilton, female    DOB: 1951/02/03, 65 y.o.   MRN: [id number]  Congestive Heart Failure Presents for follow-up visit. The disease course has been stable. Associated symptoms include edema, fatigue, palpitations (occasionally when walking) and shortness of breath (mild). Pertinent negatives include no abdominal pain, chest pain, chest pressure or orthopnea. The symptoms have been stable. Past treatments include beta blockers, aldosterone receptor blockers and salt and fluid restriction. The treatment provided moderate relief. Compliance with prior treatments has been good. Her past medical history is significant for CAD, chronic lung disease and HTN. Compliance with total regimen is 76-100%.  Hypertension This is a chronic problem. The current episode started more than 1 year ago. The problem has been waxing and waning since onset. Associated symptoms include palpitations (occasionally when walking), peripheral edema and shortness of breath (mild). Pertinent negatives include no chest pain, headaches or neck pain. Risk factors for coronary artery disease include family history, dyslipidemia and post-menopausal state. Past treatments include beta blockers, diuretics and lifestyle changes. The current treatment provides moderate improvement. There are no compliance problems.  Hypertensive end-organ damage includes kidney disease and heart failure.    Past Medical History  Diagnosis Date  . CHF (congestive heart failure) ([place])   . Hypertension   . Coronary artery disease   . Renal insufficiency   . Scleroderma ([place])   . Chronic back pain   . GERD (gastroesophageal reflux disease)   . Blue skin   . COPD (chronic obstructive pulmonary disease) ([place])   . Raynaud disease   . Shortness of breath dyspnea   . Anginal pain ([place])   . Edema   . Hyperlipemia   . Ovarian cancer ([place])     chemo/rad  . 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 Vulvar cancer [facility])     Past Surgical  History  Procedure Laterality Date  . Back surgery  1990  . Vulva surgery    . Nasal sinus surgery    . Cervical fusion    . Abdominal hysterectomy    . Cardiac catheterization  12/23/2013  . Coronary angioplasty with stent placement  12/23/2013  . Cardiac catheterization N/A 01/10/2015    Procedure: Left Heart Cath and Coronary Angiography;  Surgeon: Dionisio David, MD;  Location: [facility];  Service: Cardiovascular;  Laterality: N/A;  . Breast biopsy Left 01/15/12    neg  . Breast biopsy Left     neg bx/clip  . Breast biopsy Right     neg-bx/clip    Family History  Problem Relation Age of Onset  . Hypertension Other     Social History  Substance Use Topics  . Smoking status: Former Smoker    Types: Cigarettes    Quit date: 07/12/2013  . Smokeless tobacco: Never Used  . Alcohol Use: No    Allergies  Allergen Reactions  . Other Itching    Most narcotics cause itching, unsure of what kinds    Prior to Admission medications   Medication Sig Start Date End Date Taking? Authorizing Provider  acetaminophen (TYLENOL) 325 MG tablet Take 325 mg by mouth every 4 (four) hours as needed for mild pain, moderate pain or fever.   Yes Historical Provider, MD  albuterol (PROVENTIL HFA;VENTOLIN HFA) 108 (90 BASE) MCG/ACT inhaler Inhale 2 puffs into the lungs every 6 (six) hours as needed for wheezing or shortness of breath.   Yes Historical Provider, MD  albuterol (VENTOLIN HFA) 108 (90  Base) MCG/ACT inhaler Inhale 2 puffs into the lungs every 6 (six) hours as needed for wheezing or shortness of breath. 09/10/15  Yes Vaughan Basta, MD  ALPRAZolam Duanne Moron) 0.25 MG tablet Take 1 tablet (0.25 mg total) by mouth 3 (three) times daily as needed for anxiety. 09/10/15  Yes Vaughan Basta, MD  amitriptyline (ELAVIL) 25 MG tablet Take 1 tablet by mouth daily. 08/10/15  Yes Historical Provider, MD  aspirin EC 81 MG tablet Take 81 mg by mouth daily.   Yes Historical Provider, MD   benzonatate (TESSALON) 100 MG capsule Take 100 mg by mouth 3 (three) times daily as needed for cough.  03/21/15  Yes Historical Provider, MD  carvedilol (COREG) 25 MG tablet Take 25 mg by mouth 2 (two) times daily with a meal.   Yes Historical Provider, MD  Fenofibrate 40 MG TABS Take 20 mg by mouth daily.   Yes Historical Provider, MD  furosemide (LASIX) 20 MG tablet Take 1 tablet (20 mg total) by mouth 2 (two) times daily. 09/10/15  Yes Vaughan Basta, MD  pantoprazole (PROTONIX) 40 MG tablet Take 1 tablet by mouth 2 (two) times daily. 06/19/15  Yes Historical Provider, MD  potassium chloride (K-DUR) 10 MEQ tablet Take 1 tablet (10 mEq total) by mouth daily. 09/10/15  Yes Vaughan Basta, MD  simvastatin (ZOCOR) 40 MG tablet Take 40 mg by mouth every morning.    Yes Historical Provider, MD  spironolactone (ALDACTONE) 25 MG tablet Take 1 tablet (25 mg total) by mouth daily. 09/10/15  Yes Vaughan Basta, MD  ticagrelor (BRILINTA) 90 MG TABS tablet Take 1 tablet (90 mg total) by mouth 2 (two) times daily. 09/10/15  Yes Vaughan Basta, MD  oxyCODONE-acetaminophen (PERCOCET/ROXICET) 5-325 MG tablet Take 1 tablet by mouth every 8 (eight) hours as needed for severe pain. Patient not taking: Reported on 09/29/2015 09/10/15   Vaughan Basta, MD     Review of Systems  Constitutional: Positive for fatigue. Negative for appetite change.  HENT: Positive for rhinorrhea. Negative for congestion and sore throat.   Eyes: Negative.   Respiratory: Positive for cough and shortness of breath (mild). Negative for chest tightness and wheezing.   Cardiovascular: Positive for palpitations (occasionally when walking) and leg swelling (mild in right ankle). Negative for chest pain.  Gastrointestinal: Negative for abdominal pain and abdominal distention.  Endocrine: Positive for cold intolerance. Negative for polydipsia.  Genitourinary: Negative.   Musculoskeletal: Positive for back pain.  Negative for neck pain.  Skin: Positive for color change (bluish tint to fingers and around mouth). Negative for rash.  Allergic/Immunologic: Negative.   Neurological: Positive for dizziness and light-headedness. Negative for headaches.  Hematological: Negative for adenopathy. Does not bruise/bleed easily.  Psychiatric/Behavioral: Positive for sleep disturbance (sleeping on 1 pillow with head of bed elevated.). Negative for dysphoric mood. The patient is not nervous/anxious.        Objective:   Physical Exam  Constitutional: She is oriented to person, place, and time. She appears well-developed and well-nourished.  HENT:  Head: Normocephalic and atraumatic.  Eyes: Conjunctivae are normal. Pupils are equal, round, and reactive to light.  Neck: Normal range of motion. Neck supple.  Cardiovascular: Normal rate and regular rhythm.   Pulmonary/Chest: Effort normal. She has no wheezes. She has no rales.  Abdominal: Soft. She exhibits no distension. There is no tenderness.  Musculoskeletal: She exhibits edema (mild nonpitting edema around the right ankle). She exhibits no tenderness.  Neurological: She is alert and oriented to person, place,  and time.  Skin: Skin is warm and dry.  Psychiatric: She has a normal mood and affect. Her behavior is normal. Thought content normal.  Nursing note and vitals reviewed.   BP 147/74 mmHg  Pulse 84  Resp 20  Ht 5' (1.524 m)  Wt 111 lb (50.349 kg)  BMI 21.68 kg/m2  SpO2        Assessment & Plan:  1: Chronic heart failure with reduced ejection fraction- Patient presents with fatigue and mild shortness of breath upon exertion. When she does experience symptoms, she will stop to rest until her symptoms improve. She has continued to weigh herself and doesn't have any reportable overnight weight gains. By our scale, she's lost 10 pounds since she was last here April 2016. Reminded to call for an overnight weight gain of >2 pounds or a weekly weight gain  of >5 pounds. She is not adding any salt to her food and is trying to closely follow a low sodium diet. She follows closely with her cardiologist and says that she's recently had a cardiac CT and goes back to cardiologist on 10/27/15 to discuss possible treatment. 2: Hypotension- Blood pressure looks good here. She says that it has dropped 20 points when she's gone from a seated position to standing. She occasionally gets dizzy when she's at home if she stands too quickly or stands too long. Hasn't fallen recently. 3: Raynaud disease- Patient's fingers/hands are cold. She also has a bluish tint to her skin especially in her fingers and around her mouth. Follows with her PCP regarding this.   Return in 3 months or sooner for any questions/problems before then.

## 2015-09-29 NOTE — Patient Instructions (Signed)
Continue weighing daily and call for an overnight weight gain of > 2 pounds or a weekly weight gain of >5 pounds. 

## 2015-11-01 ENCOUNTER — Ambulatory Visit: Payer: 59

## 2015-11-22 ENCOUNTER — Inpatient Hospital Stay: Payer: Medicare Other | Attending: Obstetrics and Gynecology | Admitting: Obstetrics and Gynecology

## 2015-11-22 VITALS — BP 157/99 | HR 94 | Temp 96.2°F | Resp 16 | Wt 108.2 lb

## 2015-11-22 DIAGNOSIS — Z8543 Personal history of malignant neoplasm of ovary: Secondary | ICD-10-CM | POA: Insufficient documentation

## 2015-11-22 DIAGNOSIS — Z90710 Acquired absence of both cervix and uterus: Secondary | ICD-10-CM | POA: Insufficient documentation

## 2015-11-22 DIAGNOSIS — I7300 Raynaud's syndrome without gangrene: Secondary | ICD-10-CM | POA: Insufficient documentation

## 2015-11-22 DIAGNOSIS — Z87891 Personal history of nicotine dependence: Secondary | ICD-10-CM | POA: Insufficient documentation

## 2015-11-22 DIAGNOSIS — Z9221 Personal history of antineoplastic chemotherapy: Secondary | ICD-10-CM | POA: Diagnosis not present

## 2015-11-22 DIAGNOSIS — I5022 Chronic systolic (congestive) heart failure: Secondary | ICD-10-CM | POA: Insufficient documentation

## 2015-11-22 DIAGNOSIS — Z923 Personal history of irradiation: Secondary | ICD-10-CM | POA: Insufficient documentation

## 2015-11-22 DIAGNOSIS — G8929 Other chronic pain: Secondary | ICD-10-CM | POA: Insufficient documentation

## 2015-11-22 DIAGNOSIS — N3040 Irradiation cystitis without hematuria: Secondary | ICD-10-CM | POA: Diagnosis not present

## 2015-11-22 DIAGNOSIS — Z8589 Personal history of malignant neoplasm of other organs and systems: Secondary | ICD-10-CM | POA: Diagnosis present

## 2015-11-22 DIAGNOSIS — Z79899 Other long term (current) drug therapy: Secondary | ICD-10-CM | POA: Insufficient documentation

## 2015-11-22 DIAGNOSIS — M349 Systemic sclerosis, unspecified: Secondary | ICD-10-CM | POA: Diagnosis not present

## 2015-11-22 DIAGNOSIS — K219 Gastro-esophageal reflux disease without esophagitis: Secondary | ICD-10-CM | POA: Insufficient documentation

## 2015-11-22 DIAGNOSIS — J441 Chronic obstructive pulmonary disease with (acute) exacerbation: Secondary | ICD-10-CM | POA: Diagnosis not present

## 2015-11-22 DIAGNOSIS — I890 Lymphedema, not elsewhere classified: Secondary | ICD-10-CM | POA: Diagnosis not present

## 2015-11-22 DIAGNOSIS — E785 Hyperlipidemia, unspecified: Secondary | ICD-10-CM | POA: Diagnosis not present

## 2015-11-22 DIAGNOSIS — Z9861 Coronary angioplasty status: Secondary | ICD-10-CM | POA: Insufficient documentation

## 2015-11-22 DIAGNOSIS — Z7982 Long term (current) use of aspirin: Secondary | ICD-10-CM | POA: Diagnosis not present

## 2015-11-22 DIAGNOSIS — Z8544 Personal history of malignant neoplasm of other female genital organs: Secondary | ICD-10-CM

## 2015-11-22 DIAGNOSIS — I10 Essential (primary) hypertension: Secondary | ICD-10-CM | POA: Insufficient documentation

## 2015-11-22 DIAGNOSIS — I2510 Atherosclerotic heart disease of native coronary artery without angina pectoris: Secondary | ICD-10-CM | POA: Insufficient documentation

## 2015-11-22 DIAGNOSIS — C52 Malignant neoplasm of vagina: Secondary | ICD-10-CM

## 2015-11-22 DIAGNOSIS — Z955 Presence of coronary angioplasty implant and graft: Secondary | ICD-10-CM | POA: Insufficient documentation

## 2015-11-22 NOTE — Progress Notes (Signed)
  Oncology Nurse Navigator Documentation  Navigator Location: [facility] (11/22/15 1100) Navigator Encounter Type: [place] Follow-up (GYN ONC) (11/22/15 1100)           Patient Visit Type: Follow-up (11/22/15 1100) Treatment Phase: Follow-up (11/22/15 1100) Barriers/Navigation Needs: Coordination of Care (11/22/15 1100)                Acuity: Level 2 (11/22/15 1100)   Acuity Level 2: Initial guidance, education and coordination as needed;Educational needs;Ongoing guidance and education throughout treatment as needed (11/22/15 1100)     Time Spent with Patient: 45 (11/22/15 1100)   Chaperoned pelvic exam. Pap/HPV, right vulvar biopsy was collected. Will notify patient of results and follow up needed regarding

## 2015-11-22 NOTE — Progress Notes (Signed)
Patient has chronic nausea and dizzy that is monitored by PCP along with cardiologist.

## 2015-11-22 NOTE — Progress Notes (Signed)
Gynecologic Oncology Consult Visit   Referring Provider: Jacquelyne Balint, MD  Chief Concern: History of Stage II vaginal cancer  Subjective:  Cheryl Hamilton is a 65 y.o. female who is seen in consultation from Dr. Sabra Heck for Stage II vaginal cancer.  She returns today for follow up.  She fell in the fall and was in rehab for awhile.  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 She also has lymphedema secondary to surgery and radiation, but this is much better.  Oncology History: Cheryl Hamilton  was initially seen in consultation from Dr. Sabra Heck for vaginal cancer. 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 She had a colposcopy and wide local excision, which revealed extensive VIN III, with one small area (6 mm) of invasive cancer (2.3 mm depth). At a repeat exam, dysplastic changes were found in the vaginal vault. She underwent wide local excision of the vulva and bilateral inguinal lymphadenectomy 07/13/2013 Final pathology in the vagina was positive for malignancy. The greatest depth of invasion was 0.4 cm. 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 She had a PET on 08/17/13, which demonstrated minimal uptake in the perineum, as well as prominent uptake in the colon. She was seen by Dr. Donella Stade at [facility], who recommended external beam radiation and chemotherapy. She was referred here, as they had concerns about treating her with radiation, given her scleroderma, and she was concerned about the rate of growth of these vaginal lesions.   She was presented the options for treatment include exenteration, which would likely require at least a posterior exenteration, but may also also require an anterior exenteration. Given her significant medical comorbidities, she would be at very high surgical risk. She could also be a candidate for radiation with sensitizing chemotherapy with cisplatin with curative  intent. She is also at increased risk with radiation, because of her scleroderma, but the risk of surgery is likely higher than the risk of radiation.  After a thorough discussion regarding the pros and cons of surgery vs chemoradiation we recommended concurrent chemotherapy and radiation. She completed 36 Gy in 1.8 Gy fractions to the pelvis, and total dose of 64.8 Gy to the vaginal primary with concurrent cisplatinum therapy on 11/08/2013. She has been NED since.    Problem List: Patient Active Problem List   Diagnosis Date Noted  . CHF (congestive heart failure) ([place]) 09/07/2015  . COPD exacerbation ([place]) 09/07/2015  . Scleroderma ([place]) 06/12/2015  . ARF (acute renal failure) ([place]) 06/12/2015  . Intracranial bleed ([place]) 06/12/2015  . C7 cervical fracture ([place]) 06/12/2015  . CAD (coronary artery disease) s/p PCI. 06/12/2015  . Chronic anemia 06/12/2015  . Acute encephalopathy 06/12/2015  . Orthostatic hypotension 06/12/2015  . 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 Raynaud disease 12/28/2014  . Chronic systolic heart failure ([place]) 12/28/2014    Past Medical History: Past Medical History  Diagnosis Date  . CHF (congestive heart failure) ([place])   . Hypertension   . Coronary artery disease   . Renal insufficiency   . Scleroderma ([place])   . Chronic back pain   . GERD (gastroesophageal reflux disease)   . Blue skin   . COPD (chronic obstructive pulmonary disease) ([place])   . Raynaud disease   . Shortness of breath dyspnea   . Anginal pain ([place])   . Edema   . Hyperlipemia   . Ovarian cancer ([place])     chemo/rad  .  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 Vulvar cancer [facility])     Past Surgical History: Past Surgical History  Procedure Laterality Date  . Back surgery  1990  . Vulva surgery    . Nasal sinus surgery    . Cervical fusion    . Abdominal hysterectomy    . Cardiac catheterization  12/23/2013  . Coronary angioplasty with stent placement  12/23/2013  . Cardiac catheterization N/A  01/10/2015    Procedure: Left Heart Cath and Coronary Angiography;  Surgeon: Dionisio David, MD;  Location: [facility];  Service: Cardiovascular;  Laterality: N/A;  . Breast biopsy Left 01/15/12    neg  . Breast biopsy Left     neg bx/clip  . Breast biopsy Right     neg-bx/clip    Past Gynecologic History:  See interval history  OB History:  G2P2  Family History:  Family History positive   Comments breast cancer: daughter,   Social History: Social History   Social History  . Marital Status: Single    Spouse Name: N/A  . Number of Children: N/A  . Years of Education: N/A   Occupational History  . Not on file.   Social History Main Topics  . Smoking status: Former Smoker    Types: Cigarettes    Quit date: 07/12/2013  . Smokeless tobacco: Never Used  . Alcohol Use: No  . Drug Use: No  . Sexual Activity: Not on file   Other Topics Concern  . Not on file   Social History Narrative    Allergies: Allergies  Allergen Reactions  . Other Itching    Most narcotics cause itching, unsure of what kinds    Current Medications: Current Outpatient Prescriptions  Medication Sig Dispense Refill  . acetaminophen (TYLENOL) 325 MG tablet Take 325 mg by mouth every 4 (four) hours as needed for mild pain, moderate pain or fever.    Marland Kitchen albuterol (PROVENTIL HFA;VENTOLIN HFA) 108 (90 BASE) MCG/ACT inhaler Inhale 2 puffs into the lungs every 6 (six) hours as needed for wheezing or shortness of breath.    Marland Kitchen albuterol (VENTOLIN HFA) 108 (90 Base) MCG/ACT inhaler Inhale 2 puffs into the lungs every 6 (six) hours as needed for wheezing or shortness of breath. 1 Inhaler 2  . ALPRAZolam (XANAX) 0.25 MG tablet Take 1 tablet (0.25 mg total) by mouth 3 (three) times daily as needed for anxiety. 24 tablet 0  . amitriptyline (ELAVIL) 25 MG tablet Take 1 tablet by mouth daily.  3  . aspirin EC 81 MG tablet Take 81 mg by mouth daily.    . benzonatate (TESSALON) 100 MG capsule Take 100 mg  by mouth 3 (three) times daily as needed for cough.   99  . Fenofibrate 40 MG TABS Take 20 mg by mouth daily.    . furosemide (LASIX) 20 MG tablet Take 1 tablet (20 mg total) by mouth 2 (two) times daily. 60 tablet 0  . pantoprazole (PROTONIX) 40 MG tablet Take 1 tablet by mouth 2 (two) times daily.    . potassium chloride (K-DUR) 10 MEQ tablet Take 1 tablet (10 mEq total) by mouth daily. 30 tablet 0  . simvastatin (ZOCOR) 40 MG tablet Take 40 mg by mouth every morning.   5  . spironolactone (ALDACTONE) 25 MG tablet Take 1 tablet (25 mg total) by mouth daily. 30 tablet 0  . ticagrelor (BRILINTA) 90 MG TABS tablet Take 1 tablet (90 mg total) by mouth 2 (two) times  daily. 60 tablet 0   No current facility-administered medications for this visit.    Review of Systems General: Fatigue, weakness  HEENT: no complaints  Lungs: no complaints  Cardiac: no complaints  GI: constipation  GU: pain with urination that is chronic after radiation therapy and may be due to radiation cystitis; vulvar irritation sometimes  Musculoskeletal: no complaints  Extremities: leg swelling right > left and mons swelling  Skin: no complaints  Neuro: no complaints  Endocrine: no complaints  Psych: no complaints     Objective:  Physical Examination:  BP 157/99 mmHg  Pulse 94  Temp(Src) 96.2 F (35.7 C) (Tympanic)  Resp 16  Wt 108 lb 3.9 oz (49.1 kg)   ECOG Performance Status: 1 - Symptomatic but completely ambulatory  General appearance: alert and cooperative; appears stated age; skin color bluish/green HEENT:PERRLA and sclera clear, anicteric Lymph node survey: non-palpable, inguinal Cardiovascular: regular rate and rhythm Respiratory: decreased breath sound;  crackles: diffuse Abdomen: soft, non-tender, without masses or organomegaly, no hernias and well healed incision Extremities: nonsymmetrical. Legs without edema.  Neurological exam reveals alert, oriented, normal speech, no focal findings or  movement disorder noted.  Pelvic: exam chaperoned by nurse; Mons with lymphedema Vulva: has reddish discoloration on the right side about 5 x 5 cm with sharp border suggestive of CIS.  Not raised, but bleeds to touch  Vagina: vagina positive for atrophic mucosa and stenosis , no gross lesions, narrowed caliber; Adnexa: no masses; Uterus/Cervix: surgically absent, vaginal cuff well healed;   Procedure note:  Informed consent was performed and the vulva was cleaned and infiltrated with 1 ml of 2% xylocaine.  A biopsy forceps was used to obtain a biopsy of the abnormal skin at 8 o'clock.     Assessment:  Cheryl Hamilton is a 65 y.o. 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 Now with right vulvar changes concerning for at least VINIII.   Radiation cystitis, possible, with stable symptoms.   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  We will follow up on the results and then make plans for further diagnostic procedures or treatment as indicated.   Mellody Drown, MD    CC:  Sofie Hartigan, MD [address] [place], [place] [address] [phone]

## 2015-11-25 LAB — PAP LB AND HPV HIGH-RISK
HPV, high-risk: POSITIVE — AB
PAP Smear Comment: 0

## 2015-11-27 LAB — SURGICAL PATHOLOGY

## 2015-11-28 ENCOUNTER — Telehealth: Payer: Self-pay | Admitting: *Deleted

## 2015-11-28 NOTE — Progress Notes (Signed)
  Oncology Nurse Navigator Documentation  Navigator Location: [facility] (11/28/15 0900)                                            Time Spent with Patient: 15 (11/28/15 0900)   Results from pap and right vulva biopsy done 3/15 sent to Dr Fransisca Connors for review

## 2015-11-28 NOTE — Telephone Encounter (Signed)
[facility] called report on results from papsmear. Epithelial cell abnormality; High-grade squamous intraepithelial lesion; moderate dysplasia is present. Faxed report received. Mariea Clonts, RN is aware and faxed results to Dr. Fransisca Connors.

## 2015-11-30 ENCOUNTER — Telehealth: Payer: Self-pay

## 2015-11-30 NOTE — Telephone Encounter (Signed)
  Oncology Nurse Navigator Documentation  Navigator Location: [facility] (11/30/15 1000) Navigator Encounter Type: Telephone (11/30/15 1000) Telephone: Outgoing Call;Diagnostic Results (11/30/15 1000)             Barriers/Navigation Needs: Coordination of Care (11/30/15 1000)   Interventions: Coordination of Care (11/30/15 1000)   Coordination of Care: Appts (at [facility]) (11/30/15 1000)                  Time Spent with Patient: 30 (11/30/15 1000)   Notified Cheryl Hamilton of her vulva biopsy results. HGIL (VIN3). Made her aware that [facility] will be contacting her for appts with Dr Theora Gianotti and Dr Parke Poisson. Toney Rakes at [facility] notified.

## 2015-12-28 ENCOUNTER — Ambulatory Visit: Payer: 59 | Admitting: Family

## 2016-01-03 ENCOUNTER — Encounter: Payer: Self-pay | Admitting: Obstetrics and Gynecology

## 2016-01-03 ENCOUNTER — Inpatient Hospital Stay: Payer: Medicare Other

## 2016-01-03 ENCOUNTER — Inpatient Hospital Stay: Payer: Medicare Other | Attending: Obstetrics and Gynecology | Admitting: Obstetrics and Gynecology

## 2016-01-03 VITALS — BP 126/81 | HR 99 | Temp 97.1°F | Wt 99.5 lb

## 2016-01-03 DIAGNOSIS — I2510 Atherosclerotic heart disease of native coronary artery without angina pectoris: Secondary | ICD-10-CM | POA: Insufficient documentation

## 2016-01-03 DIAGNOSIS — Z923 Personal history of irradiation: Secondary | ICD-10-CM | POA: Diagnosis not present

## 2016-01-03 DIAGNOSIS — K219 Gastro-esophageal reflux disease without esophagitis: Secondary | ICD-10-CM | POA: Insufficient documentation

## 2016-01-03 DIAGNOSIS — I890 Lymphedema, not elsewhere classified: Secondary | ICD-10-CM | POA: Diagnosis not present

## 2016-01-03 DIAGNOSIS — Z8544 Personal history of malignant neoplasm of other female genital organs: Secondary | ICD-10-CM | POA: Diagnosis not present

## 2016-01-03 DIAGNOSIS — Z87891 Personal history of nicotine dependence: Secondary | ICD-10-CM | POA: Diagnosis not present

## 2016-01-03 DIAGNOSIS — I7300 Raynaud's syndrome without gangrene: Secondary | ICD-10-CM | POA: Insufficient documentation

## 2016-01-03 DIAGNOSIS — D071 Carcinoma in situ of vulva: Secondary | ICD-10-CM | POA: Insufficient documentation

## 2016-01-03 DIAGNOSIS — I951 Orthostatic hypotension: Secondary | ICD-10-CM

## 2016-01-03 DIAGNOSIS — E785 Hyperlipidemia, unspecified: Secondary | ICD-10-CM | POA: Diagnosis not present

## 2016-01-03 DIAGNOSIS — J441 Chronic obstructive pulmonary disease with (acute) exacerbation: Secondary | ICD-10-CM | POA: Diagnosis not present

## 2016-01-03 DIAGNOSIS — D649 Anemia, unspecified: Secondary | ICD-10-CM | POA: Insufficient documentation

## 2016-01-03 DIAGNOSIS — Z79899 Other long term (current) drug therapy: Secondary | ICD-10-CM | POA: Insufficient documentation

## 2016-01-03 DIAGNOSIS — C52 Malignant neoplasm of vagina: Secondary | ICD-10-CM

## 2016-01-03 DIAGNOSIS — Z9861 Coronary angioplasty status: Secondary | ICD-10-CM | POA: Diagnosis not present

## 2016-01-03 DIAGNOSIS — Z7982 Long term (current) use of aspirin: Secondary | ICD-10-CM | POA: Diagnosis not present

## 2016-01-03 DIAGNOSIS — E876 Hypokalemia: Secondary | ICD-10-CM | POA: Insufficient documentation

## 2016-01-03 DIAGNOSIS — Z955 Presence of coronary angioplasty implant and graft: Secondary | ICD-10-CM | POA: Diagnosis not present

## 2016-01-03 DIAGNOSIS — I10 Essential (primary) hypertension: Secondary | ICD-10-CM | POA: Diagnosis not present

## 2016-01-03 DIAGNOSIS — I5022 Chronic systolic (congestive) heart failure: Secondary | ICD-10-CM | POA: Insufficient documentation

## 2016-01-03 DIAGNOSIS — M349 Systemic sclerosis, unspecified: Secondary | ICD-10-CM | POA: Diagnosis not present

## 2016-01-03 LAB — BASIC METABOLIC PANEL
Anion gap: 13 (ref 5–15)
BUN: 9 mg/dL (ref 6–20)
CHLORIDE: 94 mmol/L — AB (ref 101–111)
CO2: 23 mmol/L (ref 22–32)
CREATININE: 1.13 mg/dL — AB (ref 0.44–1.00)
Calcium: 9.4 mg/dL (ref 8.9–10.3)
GFR calc Af Amer: 58 mL/min — ABNORMAL LOW (ref >60)
GFR calc non Af Amer: 50 mL/min — ABNORMAL LOW (ref >60)
Glucose, Bld: 239 mg/dL — ABNORMAL HIGH (ref 65–99)
POTASSIUM: 4.4 mmol/L (ref 3.5–5.1)
SODIUM: 130 mmol/L — AB (ref 135–145)

## 2016-01-03 NOTE — Progress Notes (Signed)
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  Subjective:  Cheryl Hamilton is a 65 y.o. female who is seen in consultation from Dr. Sabra Heck for Stage II vaginal cancer.  She returns today for follow up.  She was scheduled for surgery on 12/21/2015 for extensive VIN3, however, her case was canceled due to severe hypokalemia and labile HTN. She was admitted to Internal Medicine on 12/21/2015 and discharged on 12/23/2015. See below recommendations.   Severe hypokalemia:  - Stopped Lasix - PATIENT DISCONTINUED.  - Started spironolactone 12.5 mg daily and increased K supplementation to 30 mEq daily. PATIENT STARTED THESE THERAPIES AND INCREASED DOSE OF k.  - Needs BMP the week of 4/17, and should have repeat BMP every two weeks. Consider workup for hyperaldosteronism if K drops despite treatment.  K=3.7  Orthostatic hypotension c/b recurrent falls:  -home PT/OT eval set for 4/18. PT/OT CAME BUT SHE WOULD PREFER TO WORK WITH THE [facility]. SHE NEEDS TO CONTACT THEM TO SCHEDULE VISIT -Provided Ted hose. PATIENT STARTED -Started on caffeine 200 mg daily. PATIENT STARTED -Discussed safety strategies at length, please continue to reinforce (slow transitions from sitting to standing, chairs stationed throughout house, etc). PATIENT IMPLEMENTED AND HAD SOME HOUSE ALTERATION TO ASSIST WITH AMBULATION AND REDUCE FALLS  Vaginal bleeding:  -Please check CBC at next PCP visit. 4/17 CBC REVEALED HB 13.7 - Should this worsen her asa and plavix could be held for 1-2 weeks. SHE IS NOT TAKING PLAVIX. SHE CONTINUES ON BABY ASA  She feels ready to proceed with surgery as scheduled on 01/11/2016. She has seen her cardiologist and we have requested those records. She would like to cancel her [facility] appt due to transportation issues.   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 Sabra Heck for vaginal cancer. 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 She had a colposcopy and wide local excision, which revealed extensive VIN III, with one small area (6 mm) of invasive cancer (2.3 mm depth). At a repeat exam, dysplastic changes were found in the vaginal vault. She underwent wide local excision of the vulva and bilateral inguinal lymphadenectomy 07/13/2013 Final pathology in the vagina was positive for malignancy. The greatest depth of invasion was 0.4 cm. 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 She had a PET on 08/17/13, which demonstrated minimal uptake in the perineum, as well as prominent uptake in the colon. She was seen by Dr. Donella Stade at [facility], who recommended external beam radiation and chemotherapy. She was referred here, as they had concerns about treating her with radiation, given her scleroderma, and she was concerned about the rate of growth of these vaginal lesions.   She was presented the options for treatment include exenteration, which would likely require at least a posterior exenteration, but may also also require an anterior exenteration. Given her significant medical comorbidities, she would be at very high surgical risk. She could also be a candidate for radiation with sensitizing chemotherapy with cisplatin with curative intent. She is also at increased risk with radiation, because of her scleroderma, but the risk of surgery is likely higher than the risk of radiation.  After a thorough discussion regarding the pros and cons of surgery vs chemoradiation we recommended concurrent chemotherapy and  radiation. She completed 36 Gy in 1.8 Gy fractions to the pelvis, and total dose of 64.8 Gy to the vaginal primary with concurrent cisplatinum therapy on 11/08/2013. She has been NED until 11/22/2015 when she was diagnosed with  vulvar disease with VIN3 involving bilateral right and left vulvar tissue. She was scheduled for surgery on 12/21/2015.   She fell in the fall and was in rehab for awhile.  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 She also has lymphedema secondary to surgery and radiation.     Problem List: Patient Active Problem List   Diagnosis Date Noted  . CHF (congestive heart failure) ([place]) 09/07/2015  . COPD exacerbation ([place]) 09/07/2015  . Scleroderma ([place]) 06/12/2015  . ARF (acute renal failure) ([place]) 06/12/2015  . Intracranial bleed ([place]) 06/12/2015  . C7 cervical fracture ([place]) 06/12/2015  . CAD (coronary artery disease) s/p PCI. 06/12/2015  . Chronic anemia 06/12/2015  . Acute encephalopathy 06/12/2015  . Orthostatic hypotension 06/12/2015  . Vaginal cancer ([place]) 04/26/2015  . Raynaud disease 12/28/2014  . Chronic systolic heart failure ([place]) 12/28/2014    Past Medical History: Past Medical History  Diagnosis Date  . CHF (congestive heart failure) ([place])   . Hypertension   . Coronary artery disease   . Renal insufficiency   . Scleroderma ([place])   . Chronic back pain   . GERD (gastroesophageal reflux disease)   . Blue skin   . COPD (chronic obstructive pulmonary disease) ([place])   . Raynaud disease   . Shortness of breath dyspnea   . Anginal pain ([place])   . Edema   . Hyperlipemia   . Ovarian cancer ([place])     chemo/rad  . Vaginal cancer ([place])   . Vulvar cancer [facility])     Past Surgical History: Past Surgical History  Procedure Laterality Date  . Back surgery  1990  . Vulva surgery    . Nasal sinus surgery    . Cervical fusion    . Abdominal hysterectomy    . Cardiac catheterization  12/23/2013  . Coronary angioplasty with stent placement  12/23/2013  . Cardiac catheterization N/A 01/10/2015    Procedure: Left Heart Cath and Coronary Angiography;  Surgeon: Dionisio David, MD;  Location: [facility];  Service: Cardiovascular;  Laterality: N/A;  .  Breast biopsy Left 01/15/12    neg  . Breast biopsy Left     neg bx/clip  . Breast biopsy Right     neg-bx/clip    Past Gynecologic History:  See interval history  OB History:  G2P2  Family History:  Family History positive   Comments breast cancer: daughter,   Social History: Social History   Social History  . Marital Status: Single    Spouse Name: N/A  . Number of Children: N/A  . Years of Education: N/A   Occupational History  . Not on file.   Social History Main Topics  . Smoking status: Former Smoker    Types: Cigarettes    Quit date: 07/12/2013  . Smokeless tobacco: Never Used  . Alcohol Use: No  . Drug Use: No  . Sexual Activity: Not on file   Other Topics Concern  . Not on file   Social History Narrative    Allergies: Allergies  Allergen Reactions  . Other Itching    Most narcotics cause itching, unsure of what kinds    Current Medications: Current Outpatient Prescriptions  Medication Sig Dispense Refill  . acetaminophen (TYLENOL) 325  MG tablet Take 325 mg by mouth every 4 (four) hours as needed for mild pain, moderate pain or fever.    . ALPRAZolam (XANAX) 0.25 MG tablet Take 1 tablet (0.25 mg total) by mouth 3 (three) times daily as needed for anxiety. 24 tablet 0  . aspirin EC 81 MG tablet Take 81 mg by mouth daily.    . caffeine 200 MG TABS tablet Take 200 mg by mouth once.    Marland Kitchen spironolactone (ALDACTONE) 25 MG tablet Take 1 tablet (25 mg total) by mouth daily. 30 tablet 0  . Fenofibrate 40 MG TABS Take 20 mg by mouth daily. Reported on 01/03/2016     No current facility-administered medications for this visit.    Review of Systems General: Fatigue, weakness, decreased appetite  HEENT: no complaints  Lungs: no complaints  Cardiac: no complaints  GI: n/v  GU: pain with urination that is chronic after radiation therapy and may be due to radiation cystitis; vulvar irritation sometimes  Musculoskeletal: no complaints  Extremities:  decreased leg swelling  Skin: no complaints  Neuro: no complaints  Endocrine: no complaints  Psych: no complaints     Objective:  Physical Examination:  BP 126/81 mmHg  Pulse 99  Temp(Src) 97.1 F (36.2 C) (Tympanic)  Wt 99 lb 8.6 oz (45.15 kg)   ECOG Performance Status: 1 - Symptomatic but completely ambulatory  Exam deferred 12/12/2015 [facility] visit  General appearance: alert, appears stated age and cooperative HEENT: sclera clear, mucus membranes moist Neck: supple and no thyroid enlargement or cervical adenopathy Lymph node survey: non-palpable, axillary, supraclavicular, right inguinal lymphocyst present  Cardiovascular: regular rate and rhythm Respiratory: breath sounds decreased throughout Breast exam: not examined. Abdomen: soft, nontender, nondistended, bowel sounds present, no palpable masses, no hernias, well healed incision Back: inspection of back is normal Extremities: right > left lower extremity edema Skin exam - skin has a bluish hue with classic scleroderma features.  Neurological exam reveals alert, oriented, normal speech, no focal findings or movement disorder noted.  Pelvic: Mons with lymphedema Vulva: has reddish discoloration on the right side about 5 x 5 cm with sharp border suggestive of CIS; the skin discoloration extends around the urethra and to the left vulva. Not raised, but bleeds to touch Vagina: vagina positive for atrophic mucosa and stenosis , no gross lesions, narrowed caliber; Adnexa: no masses; Uterus/Cervix: surgically absent, vaginal cuff well healed;     Assessment:  Cheryl Hamilton is a 65 y.o. 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 Now with right vulvar changes concerning for at least VINIII. History of severe hypokalemia, now resolved. History of orthostatic hypotension, now improved.   Radiation cystitis, possible, with stable symptoms.   Plan:   Problem List Items Addressed This Visit       Cardiovascular and Mediastinum   Orthostatic hypotension     Genitourinary   Vaginal cancer ([place]) - Primary   Relevant Orders   Basic metabolic panel    Other Visit Diagnoses    VIN III (vulvar intraepithelial neoplasia III)        Hypokalemia          Plan to proceed with right vulvar WLE and left vulvar biopsies, EUA, colposcopy (vaginal) on May 4th 2017. We will see if Dr. Parke Poisson or Dr. Bonnita Nasuti are available for reconstruction if needed. We will check to see if anesthesia is okay with phone review and seeing her day of surgery since transportation is an  issue.  CMP today for to check on hypokalemia. I discussed postoperative care needs and she thinks she will be able to have good wound care at home. She has a friend who is a retired [employer] scientist who can assist.   I did discuss driving with her and recommend she not drive as she is having difficulty with her left leg unless she can assure she can brake the car sufficiently.  We will obtain her notes from Dr Chancy Milroy - cardiology (ph [phone]) for recommendations regarding plavix pre and post operative.     Gillis Ends, MD    CC:  Jacquelyne Balint, MD

## 2016-01-03 NOTE — Progress Notes (Signed)
Patient brought to exam room 20 via wheelchair.  Patient states she uses a cane to move around at home.  Patient states she has chronic left leg pain from kidney stones.  Vitals documented, medication record updated, information provided by patient.

## 2016-01-10 ENCOUNTER — Emergency Department: Payer: Medicare Other

## 2016-01-10 ENCOUNTER — Inpatient Hospital Stay
Admission: EM | Admit: 2016-01-10 | Discharge: 2016-01-15 | DRG: 871 | Disposition: A | Discharge status: Other Acute Inpatient Hospital | Payer: Medicare Other | Attending: Internal Medicine | Admitting: Internal Medicine

## 2016-01-10 ENCOUNTER — Inpatient Hospital Stay: Admit: 2016-01-10 | Payer: Medicare Other

## 2016-01-10 ENCOUNTER — Ambulatory Visit: Payer: 59

## 2016-01-10 ENCOUNTER — Encounter: Payer: Self-pay | Admitting: Emergency Medicine

## 2016-01-10 DIAGNOSIS — Z90710 Acquired absence of both cervix and uterus: Secondary | ICD-10-CM

## 2016-01-10 DIAGNOSIS — Z7902 Long term (current) use of antithrombotics/antiplatelets: Secondary | ICD-10-CM | POA: Diagnosis not present

## 2016-01-10 DIAGNOSIS — R000 Tachycardia, unspecified: Secondary | ICD-10-CM | POA: Diagnosis not present

## 2016-01-10 DIAGNOSIS — Z9889 Other specified postprocedural states: Secondary | ICD-10-CM

## 2016-01-10 DIAGNOSIS — M549 Dorsalgia, unspecified: Secondary | ICD-10-CM | POA: Diagnosis present

## 2016-01-10 DIAGNOSIS — E785 Hyperlipidemia, unspecified: Secondary | ICD-10-CM | POA: Diagnosis present

## 2016-01-10 DIAGNOSIS — J9601 Acute respiratory failure with hypoxia: Secondary | ICD-10-CM | POA: Diagnosis present

## 2016-01-10 DIAGNOSIS — I701 Atherosclerosis of renal artery: Secondary | ICD-10-CM | POA: Diagnosis present

## 2016-01-10 DIAGNOSIS — E860 Dehydration: Secondary | ICD-10-CM | POA: Diagnosis present

## 2016-01-10 DIAGNOSIS — I255 Ischemic cardiomyopathy: Secondary | ICD-10-CM | POA: Diagnosis not present

## 2016-01-10 DIAGNOSIS — I7300 Raynaud's syndrome without gangrene: Secondary | ICD-10-CM | POA: Diagnosis present

## 2016-01-10 DIAGNOSIS — Z9221 Personal history of antineoplastic chemotherapy: Secondary | ICD-10-CM

## 2016-01-10 DIAGNOSIS — Z79899 Other long term (current) drug therapy: Secondary | ICD-10-CM

## 2016-01-10 DIAGNOSIS — E875 Hyperkalemia: Secondary | ICD-10-CM | POA: Diagnosis present

## 2016-01-10 DIAGNOSIS — R059 Cough, unspecified: Secondary | ICD-10-CM

## 2016-01-10 DIAGNOSIS — Z8249 Family history of ischemic heart disease and other diseases of the circulatory system: Secondary | ICD-10-CM | POA: Diagnosis not present

## 2016-01-10 DIAGNOSIS — K219 Gastro-esophageal reflux disease without esophagitis: Secondary | ICD-10-CM | POA: Diagnosis present

## 2016-01-10 DIAGNOSIS — E872 Acidosis, unspecified: Secondary | ICD-10-CM

## 2016-01-10 DIAGNOSIS — Z66 Do not resuscitate: Secondary | ICD-10-CM | POA: Diagnosis present

## 2016-01-10 DIAGNOSIS — G8929 Other chronic pain: Secondary | ICD-10-CM | POA: Diagnosis present

## 2016-01-10 DIAGNOSIS — R7401 Elevation of levels of liver transaminase levels: Secondary | ICD-10-CM

## 2016-01-10 DIAGNOSIS — I2510 Atherosclerotic heart disease of native coronary artery without angina pectoris: Secondary | ICD-10-CM | POA: Diagnosis present

## 2016-01-10 DIAGNOSIS — R7989 Other specified abnormal findings of blood chemistry: Secondary | ICD-10-CM

## 2016-01-10 DIAGNOSIS — Z8543 Personal history of malignant neoplasm of ovary: Secondary | ICD-10-CM | POA: Diagnosis not present

## 2016-01-10 DIAGNOSIS — I951 Orthostatic hypotension: Secondary | ICD-10-CM | POA: Diagnosis present

## 2016-01-10 DIAGNOSIS — Z981 Arthrodesis status: Secondary | ICD-10-CM

## 2016-01-10 DIAGNOSIS — R791 Abnormal coagulation profile: Secondary | ICD-10-CM | POA: Diagnosis present

## 2016-01-10 DIAGNOSIS — C52 Malignant neoplasm of vagina: Secondary | ICD-10-CM | POA: Diagnosis present

## 2016-01-10 DIAGNOSIS — N179 Acute kidney failure, unspecified: Secondary | ICD-10-CM | POA: Diagnosis not present

## 2016-01-10 DIAGNOSIS — R05 Cough: Secondary | ICD-10-CM

## 2016-01-10 DIAGNOSIS — F17200 Nicotine dependence, unspecified, uncomplicated: Secondary | ICD-10-CM | POA: Diagnosis present

## 2016-01-10 DIAGNOSIS — J449 Chronic obstructive pulmonary disease, unspecified: Secondary | ICD-10-CM | POA: Diagnosis present

## 2016-01-10 DIAGNOSIS — I429 Cardiomyopathy, unspecified: Secondary | ICD-10-CM | POA: Diagnosis present

## 2016-01-10 DIAGNOSIS — I272 Other secondary pulmonary hypertension: Secondary | ICD-10-CM | POA: Diagnosis present

## 2016-01-10 DIAGNOSIS — D72829 Elevated white blood cell count, unspecified: Secondary | ICD-10-CM | POA: Diagnosis not present

## 2016-01-10 DIAGNOSIS — I509 Heart failure, unspecified: Secondary | ICD-10-CM | POA: Diagnosis present

## 2016-01-10 DIAGNOSIS — N184 Chronic kidney disease, stage 4 (severe): Secondary | ICD-10-CM | POA: Diagnosis present

## 2016-01-10 DIAGNOSIS — Z955 Presence of coronary angioplasty implant and graft: Secondary | ICD-10-CM | POA: Diagnosis not present

## 2016-01-10 DIAGNOSIS — R0602 Shortness of breath: Secondary | ICD-10-CM

## 2016-01-10 DIAGNOSIS — M349 Systemic sclerosis, unspecified: Secondary | ICD-10-CM | POA: Diagnosis not present

## 2016-01-10 DIAGNOSIS — A419 Sepsis, unspecified organism: Secondary | ICD-10-CM | POA: Diagnosis present

## 2016-01-10 DIAGNOSIS — Z7982 Long term (current) use of aspirin: Secondary | ICD-10-CM | POA: Diagnosis not present

## 2016-01-10 DIAGNOSIS — E876 Hypokalemia: Secondary | ICD-10-CM | POA: Diagnosis present

## 2016-01-10 DIAGNOSIS — I130 Hypertensive heart and chronic kidney disease with heart failure and stage 1 through stage 4 chronic kidney disease, or unspecified chronic kidney disease: Secondary | ICD-10-CM | POA: Diagnosis present

## 2016-01-10 DIAGNOSIS — Z8544 Personal history of malignant neoplasm of other female genital organs: Secondary | ICD-10-CM | POA: Diagnosis not present

## 2016-01-10 DIAGNOSIS — I259 Chronic ischemic heart disease, unspecified: Secondary | ICD-10-CM

## 2016-01-10 DIAGNOSIS — M6282 Rhabdomyolysis: Secondary | ICD-10-CM | POA: Diagnosis present

## 2016-01-10 DIAGNOSIS — R740 Nonspecific elevation of levels of transaminase and lactic acid dehydrogenase [LDH]: Secondary | ICD-10-CM | POA: Diagnosis present

## 2016-01-10 DIAGNOSIS — D696 Thrombocytopenia, unspecified: Secondary | ICD-10-CM | POA: Diagnosis present

## 2016-01-10 DIAGNOSIS — R778 Other specified abnormalities of plasma proteins: Secondary | ICD-10-CM

## 2016-01-10 LAB — MAGNESIUM: Magnesium: 1.9 mg/dL (ref 1.7–2.4)

## 2016-01-10 LAB — BLOOD GAS, ARTERIAL
ACID-BASE DEFICIT: 15.1 mmol/L — AB (ref 0.0–2.0)
Bicarbonate: 9.6 mEq/L — ABNORMAL LOW (ref 21.0–28.0)
FIO2: 36
O2 SAT: 98.9 %
PATIENT TEMPERATURE: 37
PCO2 ART: 20 mmHg — AB (ref 32.0–48.0)
pH, Arterial: 7.29 — ABNORMAL LOW (ref 7.350–7.450)
pO2, Arterial: 141 mmHg — ABNORMAL HIGH (ref 83.0–108.0)

## 2016-01-10 LAB — PROTIME-INR
INR: 2.08
PROTHROMBIN TIME: 23.2 s — AB (ref 11.4–15.0)

## 2016-01-10 LAB — GLUCOSE, CAPILLARY: Glucose-Capillary: 109 mg/dL — ABNORMAL HIGH (ref 65–99)

## 2016-01-10 LAB — BASIC METABOLIC PANEL
Anion gap: 18 — ABNORMAL HIGH (ref 5–15)
BUN: 25 mg/dL — AB (ref 6–20)
CO2: 11 mmol/L — ABNORMAL LOW (ref 22–32)
CREATININE: 1.91 mg/dL — AB (ref 0.44–1.00)
Calcium: 8.3 mg/dL — ABNORMAL LOW (ref 8.9–10.3)
Chloride: 102 mmol/L (ref 101–111)
GFR calc Af Amer: 31 mL/min — ABNORMAL LOW (ref >60)
GFR, EST NON AFRICAN AMERICAN: 26 mL/min — AB (ref >60)
GLUCOSE: 152 mg/dL — AB (ref 65–99)
Potassium: 6.5 mmol/L — ABNORMAL HIGH (ref 3.5–5.1)
SODIUM: 131 mmol/L — AB (ref 135–145)

## 2016-01-10 LAB — CBC
HCT: 37.8 % (ref 35.0–47.0)
Hemoglobin: 11.8 g/dL — ABNORMAL LOW (ref 12.0–16.0)
MCH: 27.4 pg (ref 26.0–34.0)
MCHC: 31.2 g/dL — AB (ref 32.0–36.0)
MCV: 87.7 fL (ref 80.0–100.0)
PLATELETS: 113 10*3/uL — AB (ref 150–440)
RBC: 4.31 MIL/uL (ref 3.80–5.20)
RDW: 26.6 % — AB (ref 11.5–14.5)
WBC: 14.3 10*3/uL — AB (ref 3.6–11.0)

## 2016-01-10 LAB — URINALYSIS COMPLETE WITH MICROSCOPIC (ARMC ONLY)
BILIRUBIN URINE: NEGATIVE
Bacteria, UA: NONE SEEN
GLUCOSE, UA: NEGATIVE mg/dL
Ketones, ur: NEGATIVE mg/dL
LEUKOCYTES UA: NEGATIVE
NITRITE: NEGATIVE
Protein, ur: 100 mg/dL — AB
SQUAMOUS EPITHELIAL / LPF: NONE SEEN
Specific Gravity, Urine: 1.016 (ref 1.005–1.030)
pH: 5 (ref 5.0–8.0)

## 2016-01-10 LAB — LACTIC ACID, PLASMA
LACTIC ACID, VENOUS: 5.6 mmol/L — AB (ref 0.5–2.0)
LACTIC ACID, VENOUS: 7.1 mmol/L — AB (ref 0.5–2.0)

## 2016-01-10 LAB — CREATININE, SERUM
Creatinine, Ser: 1.7 mg/dL — ABNORMAL HIGH (ref 0.44–1.00)
GFR calc non Af Amer: 30 mL/min — ABNORMAL LOW (ref >60)
GFR, EST AFRICAN AMERICAN: 35 mL/min — AB (ref >60)

## 2016-01-10 LAB — TROPONIN I
TROPONIN I: 0.41 ng/mL — AB (ref <0.031)
Troponin I: 0.91 ng/mL — ABNORMAL HIGH (ref <0.031)

## 2016-01-10 LAB — APTT: APTT: 34 s (ref 24–36)

## 2016-01-10 LAB — MRSA PCR SCREENING: MRSA by PCR: NEGATIVE

## 2016-01-10 LAB — TSH: TSH: 2.164 u[IU]/mL (ref 0.350–4.500)

## 2016-01-10 LAB — LIPASE, BLOOD: LIPASE: 27 U/L (ref 11–51)

## 2016-01-10 MED ORDER — SODIUM CHLORIDE 0.9 % IV BOLUS (SEPSIS)
1000.0000 mL | Freq: Once | INTRAVENOUS | Status: AC
  Administered 2016-01-10: 1000 mL via INTRAVENOUS

## 2016-01-10 MED ORDER — CETYLPYRIDINIUM CHLORIDE 0.05 % MT LIQD
7.0000 mL | Freq: Two times a day (BID) | OROMUCOSAL | Status: DC
  Administered 2016-01-11 – 2016-01-14 (×7): 7 mL via OROMUCOSAL

## 2016-01-10 MED ORDER — VANCOMYCIN HCL IN DEXTROSE 1-5 GM/200ML-% IV SOLN: 1000.0000 mg | Freq: Once | INTRAVENOUS | Status: DC

## 2016-01-10 MED ORDER — SODIUM CHLORIDE 0.9 % IV BOLUS (SEPSIS)
500.0000 mL | Freq: Once | INTRAVENOUS | Status: AC
  Administered 2016-01-10: 500 mL via INTRAVENOUS

## 2016-01-10 MED ORDER — FENOFIBRATE 160 MG PO TABS
160.0000 mg | ORAL_TABLET | Freq: Every day | ORAL | Status: DC
  Administered 2016-01-10 – 2016-01-14 (×5): 160 mg via ORAL
  Filled 2016-01-10 (×5): qty 1

## 2016-01-10 MED ORDER — DEXTROSE 50 % IV SOLN
1.0000 | Freq: Once | INTRAVENOUS | Status: AC
  Administered 2016-01-10: 50 mL via INTRAVENOUS
  Filled 2016-01-10: qty 50

## 2016-01-10 MED ORDER — AMITRIPTYLINE HCL 25 MG PO TABS
25.0000 mg | ORAL_TABLET | Freq: Every day | ORAL | Status: DC
  Administered 2016-01-10 – 2016-01-14 (×4): 25 mg via ORAL
  Filled 2016-01-10 (×5): qty 1

## 2016-01-10 MED ORDER — SIMVASTATIN 20 MG PO TABS
20.0000 mg | ORAL_TABLET | Freq: Every day | ORAL | Status: DC
Start: 2016-01-10 — End: 2016-01-12
  Administered 2016-01-10 – 2016-01-11 (×2): 20 mg via ORAL
  Filled 2016-01-10 (×2): qty 1

## 2016-01-10 MED ORDER — METOPROLOL TARTRATE 5 MG/5ML IV SOLN
5.0000 mg | Freq: Four times a day (QID) | INTRAVENOUS | Status: DC
  Administered 2016-01-11 – 2016-01-12 (×3): 5 mg via INTRAVENOUS
  Filled 2016-01-10 (×3): qty 5

## 2016-01-10 MED ORDER — SODIUM CHLORIDE 0.9 % IV SOLN
INTRAVENOUS | Status: DC
  Administered 2016-01-10: 21:00:00 via INTRAVENOUS

## 2016-01-10 MED ORDER — PIPERACILLIN-TAZOBACTAM 3.375 G IVPB 30 MIN
3.3750 g | Freq: Once | INTRAVENOUS | Status: AC
  Administered 2016-01-10: 3.375 g via INTRAVENOUS
  Filled 2016-01-10: qty 50

## 2016-01-10 MED ORDER — PIPERACILLIN-TAZOBACTAM 3.375 G IVPB
3.3750 g | Freq: Three times a day (TID) | INTRAVENOUS | Status: DC
  Administered 2016-01-10 – 2016-01-11 (×2): 3.375 g via INTRAVENOUS
  Filled 2016-01-10 (×4): qty 50

## 2016-01-10 MED ORDER — SODIUM CHLORIDE 0.9% FLUSH
3.0000 mL | Freq: Two times a day (BID) | INTRAVENOUS | Status: DC
  Administered 2016-01-10 – 2016-01-13 (×5): 3 mL via INTRAVENOUS

## 2016-01-10 MED ORDER — METOPROLOL SUCCINATE ER 50 MG PO TB24
50.0000 mg | ORAL_TABLET | Freq: Every day | ORAL | Status: DC
  Administered 2016-01-10 – 2016-01-13 (×3): 50 mg via ORAL
  Filled 2016-01-10 (×4): qty 1

## 2016-01-10 MED ORDER — VANCOMYCIN HCL IN DEXTROSE 750-5 MG/150ML-% IV SOLN
750.0000 mg | INTRAVENOUS | Status: DC
  Filled 2016-01-10: qty 150

## 2016-01-10 MED ORDER — SODIUM POLYSTYRENE SULFONATE 15 GM/60ML PO SUSP
30.0000 g | Freq: Once | ORAL | Status: AC
  Administered 2016-01-10: 30 g via ORAL
  Filled 2016-01-10: qty 120

## 2016-01-10 MED ORDER — CALCIUM GLUCONATE 10 % IV SOLN
1.0000 g | Freq: Once | INTRAVENOUS | Status: AC
  Administered 2016-01-10: 1 g via INTRAVENOUS
  Filled 2016-01-10: qty 10

## 2016-01-10 MED ORDER — ALBUTEROL SULFATE (2.5 MG/3ML) 0.083% IN NEBU: 2.5000 mg | INHALATION_SOLUTION | Freq: Four times a day (QID) | RESPIRATORY_TRACT | Status: DC | PRN

## 2016-01-10 MED ORDER — ASPIRIN EC 81 MG PO TBEC
81.0000 mg | DELAYED_RELEASE_TABLET | Freq: Every day | ORAL | Status: DC
  Administered 2016-01-10 – 2016-01-14 (×5): 81 mg via ORAL
  Filled 2016-01-10 (×5): qty 1

## 2016-01-10 MED ORDER — INSULIN ASPART 100 UNIT/ML ~~LOC~~ SOLN
5.0000 [IU] | Freq: Once | SUBCUTANEOUS | Status: AC
  Administered 2016-01-10: 5 [IU] via INTRAVENOUS
  Filled 2016-01-10: qty 5

## 2016-01-10 MED ORDER — ENOXAPARIN SODIUM 40 MG/0.4ML ~~LOC~~ SOLN
40.0000 mg | SUBCUTANEOUS | Status: DC
  Administered 2016-01-10: 40 mg via SUBCUTANEOUS
  Filled 2016-01-10: qty 0.4

## 2016-01-10 MED ORDER — TRAMADOL HCL 50 MG PO TABS
50.0000 mg | ORAL_TABLET | Freq: Four times a day (QID) | ORAL | Status: DC | PRN
  Administered 2016-01-12 – 2016-01-14 (×2): 50 mg via ORAL
  Filled 2016-01-10 (×2): qty 1

## 2016-01-10 MED ORDER — ALPRAZOLAM 0.25 MG PO TABS
0.2500 mg | ORAL_TABLET | Freq: Three times a day (TID) | ORAL | Status: DC | PRN
  Administered 2016-01-12: 0.25 mg via ORAL
  Filled 2016-01-10: qty 1

## 2016-01-10 MED ORDER — CLOPIDOGREL BISULFATE 75 MG PO TABS
75.0000 mg | ORAL_TABLET | Freq: Every day | ORAL | Status: DC
  Administered 2016-01-10 – 2016-01-14 (×5): 75 mg via ORAL
  Filled 2016-01-10 (×5): qty 1

## 2016-01-10 MED ORDER — VANCOMYCIN HCL IN DEXTROSE 750-5 MG/150ML-% IV SOLN
750.0000 mg | INTRAVENOUS | Status: AC
  Administered 2016-01-10: 750 mg via INTRAVENOUS
  Filled 2016-01-10 (×2): qty 150

## 2016-01-10 NOTE — Progress Notes (Signed)
Updated on lactic acid 7.1, orders received

## 2016-01-10 NOTE — ED Notes (Signed)
Pt comes into the ED via EMS from home c/o lethargy.  Patient claims she has been weak due to vomiting for the past couple of weeks.  Pulse ox was unobtainable and capnography was at 15.  Patient placed on 4 L .  Initial pressure 76/40 and then increased to 90/70.  CBG 127.  H/o cancer of the vulva, heart disease, COPD, and consider that causes cyanosis.  Patient is scheduled for surgery tomorrow at [facility] for her cancer.  Patient alert and oriented at this time with even respirations and declines any shortness of breath.

## 2016-01-10 NOTE — Progress Notes (Signed)
eLink Physician-Brief Progress Note Patient Name: Cheryl Hamilton [facility] DOB: Sep 24, 1950 MRN: [id number]   Date of Service  01/10/2016  HPI/Events of Note  Lactic Acid 5.6 >> 7.4. Hgb = 11.8 No CVL for CVP or Coox. BP = 139/96. Last LVEF = 40%.  eICU Interventions  Will order: 1. 0.9 NaCl 500 mL IV over 30 minutes now.  Continue to trend Lactic Acid. If not able to clear will require CVL for CVP and Coox monitoring.      Intervention Category Major Interventions: Acid-Base disturbance - evaluation and management  Cheryl Hamilton 01/10/2016, 10:40 PM

## 2016-01-10 NOTE — Progress Notes (Signed)
   Spoke with ED MD regarding patient's EKG. Patient with newly found RBBB which does warrant further work up. Given EKG and patient has never had chest pain this does not meet STEMI criteria. RBBB may be rate related (would look into etiologies of this), however, this would be for the patient's primary cardiologist (Dr. Chancy Milroy, MD) to determine. The lateral st changes are 2/2 the RBBB. Dr. Ellyn Hack to discuss with MD. Patient to follow up with primary cardiologist.

## 2016-01-10 NOTE — Progress Notes (Signed)
Pharmacy Antibiotic Note  Caidyn Runser is a 65 y.o. female admitted on 01/10/2016 with sepsis.  Pharmacy has been consulted for vancomycin and zosyn dosing.  Plan: Pt received vancomycin 750 mg IV x1 in the ED and Zosyn 3.375 g IV x1. Will order vancomycin 750 mg IV q36h to start on 5/4 at 2200  Zosyn 3.375 g IV q8h EI - Renal function borderline for requiring dose change, watch renal function carefully Will also need to order vanc trough  Ke 0.022, half life 31.5, Vd 32.4 L  Height: 5' (152.4 cm) Weight: 102 lb (46.267 kg) IBW/kg (Calculated) : 45.5  Temp (24hrs), Avg:97.5 F (36.4 C), Min:97.5 F (36.4 C), Max:97.5 F (36.4 C)   Recent Labs Lab 01/10/16 1541 01/10/16 1615  WBC 14.3*  --   CREATININE 1.91*  --   LATICACIDVEN  --  5.6*    Estimated Creatinine Clearance: 21.1 mL/min (by C-G formula based on Cr of 1.91).    Allergies  Allergen Reactions  . Other Itching and Other (See Comments)    Pt states that she is allergic to most narcotics.        Thank you for allowing pharmacy to be a part of this patient's care.  Rocky Morel 01/10/2016 8:36 PM

## 2016-01-10 NOTE — Progress Notes (Signed)
eLink Physician-Brief Progress Note Patient Name: Cheryl Hamilton [facility] DOB: 14-Jan-1951 MRN: [id number]   Date of Service  01/10/2016  HPI/Events of Note  Multiple issues: 1. Request for foley catheter and 2. Blood glucose > 10. However, patient is awake and alert. Therefore, I doubt that the POC glucose is correct.  eICU Interventions  Will order: 1. Place Foley Catheter.  2. Repeat blood glucose in lab.     Intervention Category Major Interventions: Other: Minor Interventions: Routine modifications to care plan (e.g. PRN medications for pain, fever)  Sommer,Steven Eugene 01/10/2016, 8:36 PM

## 2016-01-10 NOTE — H&P (Signed)
[facility] NAME: Cheryl Hamilton    MR#:  [id number]  DATE OF BIRTH:  October 11, 1950  DATE OF ADMISSION:  01/10/2016  PRIMARY CARE PHYSICIAN: Sofie Hartigan, MD   REQUESTING/REFERRING PHYSICIAN: Enid Skeens MD  CHIEF COMPLAINT:   Chief Complaint  Patient presents with  . Fatigue    HISTORY OF PRESENT ILLNESS: Cheryl Hamilton  is a 65 y.o. female with a known history of  Multiple medical problems including CHF, hypertension, coronary artery disease, chronic renal failure and scleroderma resulting in him loose skin. Presents with generalized weakness and fatigueness. Patient in the ED is noted to have acute renal failure hyperkalemia. Sinus tachycardia. Lactic acid level is greater than 6. Patient also had troponin elevation and some ST changes. The ED physician spoke to Dr. Ellyn Hack of cardiology who  will feel that this is unlikely acute ST EMI. Also he did not feel that she would be a heart attack Candidate right away based on her current presentation. Patient is a very poor historian and states that she has not been eating or drinking for the past few weeks and is just dehydrated. She denies any chest pains. Denies any shortness of breath no nausea vomiting or diarrhea denies any urinary symptoms.      PAST MEDICAL HISTORY:   Past Medical History  Diagnosis Date  . CHF (congestive heart failure) ([place])   . Hypertension   . Coronary artery disease   . Renal insufficiency   . Scleroderma ([place])   . Chronic back pain   . GERD (gastroesophageal reflux disease)   . Blue skin   . COPD (chronic obstructive pulmonary disease) ([place])   . Raynaud disease   . Shortness of breath dyspnea   . Anginal pain ([place])   . Edema   . Hyperlipemia   . Ovarian cancer ([place])     chemo/rad  . 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 Vulvar cancer ([place])     PAST SURGICAL HISTORY: Past Surgical History  Procedure Laterality Date  . Back surgery  1990  . Vulva  surgery    . Nasal sinus surgery    . Cervical fusion    . Abdominal hysterectomy    . Cardiac catheterization  12/23/2013  . Coronary angioplasty with stent placement  12/23/2013  . Cardiac catheterization N/A 01/10/2015    Procedure: Left Heart Cath and Coronary Angiography;  Surgeon: Dionisio David, MD;  Location: [facility];  Service: Cardiovascular;  Laterality: N/A;  . Breast biopsy Left 01/15/12    neg  . Breast biopsy Left     neg bx/clip  . Breast biopsy Right     neg-bx/clip    SOCIAL HISTORY:  Social History  Substance Use Topics  . Smoking status: Former Smoker    Types: Cigarettes    Quit date: 07/12/2013  . Smokeless tobacco: Never Used  . Alcohol Use: No    FAMILY HISTORY:  Family History  Problem Relation Age of Onset  . Hypertension Other     DRUG ALLERGIES:  Allergies  Allergen Reactions  . Other Itching and Other (See Comments)    Pt states that she is allergic to most narcotics.      REVIEW OF SYSTEMS:   CONSTITUTIONAL: No fever, Positive fatigue and weakness.  EYES: No blurred or double vision.  EARS, NOSE, AND THROAT: No tinnitus or ear pain.  RESPIRATORY: No cough, shortness of breath, wheezing or hemoptysis.  CARDIOVASCULAR: No chest pain, orthopnea, edema.  GASTROINTESTINAL: No nausea, vomiting, diarrhea or abdominal pain.  GENITOURINARY: No dysuria, hematuria.  ENDOCRINE: No polyuria, nocturia,  HEMATOLOGY: No anemia, easy bruising or bleeding SKIN: No rash or lesion. MUSCULOSKELETAL: No joint pain or arthritis.   NEUROLOGIC: No tingling, numbness, weakness.  PSYCHIATRY: No anxiety or depression.   MEDICATIONS AT HOME:  Prior to Admission medications   Medication Sig Start Date End Date Taking? Authorizing Provider  albuterol (PROVENTIL HFA;VENTOLIN HFA) 108 (90 Base) MCG/ACT inhaler Inhale 2 puffs into the lungs every 6 (six) hours as needed for wheezing or shortness of breath.   Yes Historical Provider, MD  ALPRAZolam  (XANAX) 0.25 MG tablet Take 1 tablet (0.25 mg total) by mouth 3 (three) times daily as needed for anxiety. 09/10/15  Yes Vaughan Basta, MD  amitriptyline (ELAVIL) 25 MG tablet Take 25 mg by mouth at bedtime.   Yes Historical Provider, MD  aspirin EC 81 MG tablet Take 81 mg by mouth daily.   Yes Historical Provider, MD  caffeine 200 MG TABS tablet Take 200 mg by mouth daily.    Yes Historical Provider, MD  clopidogrel (PLAVIX) 75 MG tablet Take 75 mg by mouth daily.   Yes Historical Provider, MD  fenofibrate (TRICOR) 145 MG tablet Take 145 mg by mouth daily.   Yes Historical Provider, MD  furosemide (LASIX) 20 MG tablet Take 20 mg by mouth 2 (two) times daily.   Yes Historical Provider, MD  metoprolol succinate (TOPROL-XL) 50 MG 24 hr tablet Take 50 mg by mouth daily. Take with or immediately following a meal.   Yes Historical Provider, MD  potassium chloride (MICRO-K) 10 MEQ CR capsule Take 10 mEq by mouth 3 (three) times daily.   Yes Historical Provider, MD  simvastatin (ZOCOR) 20 MG tablet Take 20 mg by mouth at bedtime.   Yes Historical Provider, MD  spironolactone (ALDACTONE) 25 MG tablet Take 12.5 mg by mouth daily.   Yes Historical Provider, MD  traMADol (ULTRAM) 50 MG tablet Take 50 mg by mouth every 6 (six) hours as needed for moderate pain.   Yes Historical Provider, MD      PHYSICAL EXAMINATION:   VITAL SIGNS: Blood pressure 129/95, pulse 127, temperature 97.5 F (36.4 C), temperature source Oral, resp. rate 23, height 5' (1.524 m), weight 46.267 kg (102 lb).  GENERAL:  65 y.o.-year-old patient lying in the bed with no acute distress.  EYES: Pupils equal, round, reactive to light and accommodation. No scleral icterus. Extraocular muscles intact.  HEENT: Head atraumatic, normocephalic. Oropharynx and nasopharynx clear.  NECK:  Supple, no jugular venous distention. No thyroid enlargement, no tenderness.  LUNGS: Normal breath sounds bilaterally, no wheezing, rales,rhonchi or  crepitation. No use of accessory muscles of respiration.  CARDIOVASCULAR: S1, S2 Tachycardic No murmurs, rubs, or gallops.  ABDOMEN: Soft, nontender, nondistended. Bowel sounds present. No organomegaly or mass.  EXTREMITIES: No pedal edema, cyanosis, or clubbing.  NEUROLOGIC: Cranial nerves II through XII are intact. Muscle strength 5/5 in all extremities. Sensation intact. Gait not checked.  PSYCHIATRIC: The patient is alert and oriented x 3.  SKIN: Patient has blue skin  LABORATORY PANEL:   CBC  Recent Labs Lab 01/10/16 1541  WBC 14.3*  HGB 11.8*  HCT 37.8  PLT 113*  MCV 87.7  MCH 27.4  MCHC 31.2*  RDW 26.6*   ------------------------------------------------------------------------------------------------------------------  Chemistries   Recent Labs Lab 01/10/16 1541  NA 131*  K 6.5*  CL 102  CO2  11*  GLUCOSE 152*  BUN 25*  CREATININE 1.91*  CALCIUM 8.3*  MG 1.9   ------------------------------------------------------------------------------------------------------------------ estimated creatinine clearance is 21.1 mL/min (by C-G formula based on Cr of 1.91). ------------------------------------------------------------------------------------------------------------------ No results for input(s): TSH, T4TOTAL, T3FREE, THYROIDAB in the last 72 hours.  Invalid input(s): FREET3   Coagulation profile  Recent Labs Lab 01/10/16 1541  INR 2.08   ------------------------------------------------------------------------------------------------------------------- No results for input(s): DDIMER in the last 72 hours. -------------------------------------------------------------------------------------------------------------------  Cardiac Enzymes  Recent Labs Lab 01/10/16 1541  TROPONINI 0.41*   ------------------------------------------------------------------------------------------------------------------ Invalid input(s):  POCBNP  ---------------------------------------------------------------------------------------------------------------  Urinalysis    Component Value Date/Time   COLORURINE AMBER* 01/10/2016 1615   COLORURINE Yellow 11/25/2013 1624   APPEARANCEUR CLEAR* 01/10/2016 1615   APPEARANCEUR Clear 11/25/2013 1624   LABSPEC 1.016 01/10/2016 1615   LABSPEC 1.029 11/25/2013 1624   PHURINE 5.0 01/10/2016 1615   PHURINE 5.0 11/25/2013 1624   GLUCOSEU NEGATIVE 01/10/2016 1615   GLUCOSEU >=500 11/25/2013 1624   HGBUR 2+* 01/10/2016 1615   HGBUR 2+ 11/25/2013 1624   BILIRUBINUR NEGATIVE 01/10/2016 1615   BILIRUBINUR Negative 11/25/2013 1624   KETONESUR NEGATIVE 01/10/2016 1615   KETONESUR Negative 11/25/2013 1624   PROTEINUR 100* 01/10/2016 1615   PROTEINUR >=500 11/25/2013 1624   NITRITE NEGATIVE 01/10/2016 1615   NITRITE Negative 11/25/2013 1624   LEUKOCYTESUR NEGATIVE 01/10/2016 1615   LEUKOCYTESUR Negative 11/25/2013 1624     RADIOLOGY: Dg Chest Port 1 View  01/10/2016  CLINICAL DATA:  Sepsis EXAM: PORTABLE CHEST 1 VIEW COMPARISON:  09/07/2015 chest radiograph. FINDINGS: Stable cardiomediastinal silhouette with mild cardiomegaly. No pneumothorax. No pleural effusion. No overt pulmonary edema. No acute consolidative airspace disease. Healed deformities in the bilateral clavicles and left lateral eighth and ninth ribs. IMPRESSION: Stable mild cardiomegaly without overt pulmonary edema. No acute pulmonary disease. Electronically Signed   By: Ilona Sorrel M.D.   On: 01/10/2016 16:31    EKG: Orders placed or performed during the hospital encounter of 01/10/16  . EKG 12-Lead  . EKG 12-Lead  . ED EKG  . ED EKG  . EKG 12-Lead  . EKG 12-Lead    IMPRESSION AND PLAN: Patient is a 65 year old white female presents with generalized weakness 1. Sepsis based on her tachycardia and leukocytosis she also has severe lactic acidosis Chest x-ray reviewed urinalysis reviewed did not show any  significant abnormality We'll treat with impact antibiotics with Zosyn and vancomycin Follow blood cultures urine culture  2. Acute renal failure suspect due to dehydration ATN is possibility We'll treat with IV fluids monitor renal function Avoid nephrotoxins Discontinue spinal lactone  3. Hyperkalemia: Patient treated for hyperkalemia I will give her 1 more dose of Kayexalate and repeat potassium level later today Likely cause for the hyperkalemia is recent spinal lactone initiation by her primary care provider  4. Sinus tachycardia Likely related to sepsis I will give her IV metoprolol Also in light of her requiring oxygen will try to get a VQ scan tomorrow  5. Elevated troponin with ST changes aspirin for now cardiology consult Echocardiogram in the morning Continue aspirin Plavix 6. Hyperlipidemia continue TriCor and Zocor  7. Miscellaneous Lovenox for DVT prophylaxis All the records are reviewed and case discussed with ED provider. Management plans discussed with the patient, family and they are in agreement.  CODE STATUS:    Code Status Orders        Start     Ordered   01/10/16 1829  Full code   Continuous  01/10/16 1829    Code Status History    Date Active Date Inactive Code Status Order ID Comments User Context   09/07/2015  9:36 AM 09/10/2015  6:23 PM Full Code [id number]  Hillary Bow, MD ED   06/12/2015  3:37 AM 06/15/2015  3:42 PM Full Code [id number]  Rise Patience, MD Inpatient   06/12/2015 12:19 AM 06/12/2015  3:37 AM Full Code [id number]  Rise Patience, MD Inpatient    Advance Directive Documentation        Most Recent Value   Type of Advance Directive  Healthcare Power of Attorney   Pre-existing out of facility DNR order (yellow form or pink MOST form)     "MOST" Form in Place?         TOTAL TIME TAKING CARE OF THIS PATIENT: 55 minutes.    Dustin Flock M.D on 01/10/2016 at 6:37 PM  Between 7am to 6pm - Pager - [phone]  After  6pm go to [email or web address] - password [email or web address] [facility]  [facility]  Office  [phone]  CC: Primary care physician; [facility], Chrissie Noa, MD

## 2016-01-10 NOTE — ED Provider Notes (Signed)
[facility] Emergency Department Provider Note  ____________________________________________  Time seen: Approximately 3:50 PM  I have reviewed the triage vital signs and the nursing notes.   HISTORY  Chief Complaint Fatigue    HPI Cheryl Hamilton is a 65 y.o. female with an extensive chronic past medical history.She presents by EMS for evaluation of gradual onset of lethargy that is been getting progressively worse over the last several days.  She reports that for the last couple of weeks she has had difficulty taking any by mouth intake and over the last week or so even clear fluids come back up.  She has felt increasingly weak all over.  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 Theora Gianotti with [facility] and she is concerned she may not be able to go to the surgery.  It is immediately notable that the patient appears cyanotic at first glance but she states "I am always this color" due to scleroderma.  However as result of her skin discoloration is not possible to obtain a reliable pulse ox reading.  In spite of, or in addition to, her coloration, she is toxic appearing upon arrival with a heart rate in the 130s, respiratory rate in the 30s, cachexia, and ill appearance.  However she is alert and oriented.  She denies fever/chills, chest pain, shortness of breath, abdominal pain, dysuria.  She endorses persistent nausea and vomiting with any attempted PO intake.  Her symptoms are severe even though they have been gradual in onset over time.  Nothing is making her better and she is slowly getting worse on her own.    Past Medical History  Diagnosis Date  . CHF (congestive heart failure) ([place])   . Hypertension   . Coronary artery disease   . Renal insufficiency   . Scleroderma ([place])   . Chronic back pain   . GERD (gastroesophageal reflux disease)   . Blue skin   . COPD (chronic obstructive pulmonary disease) ([place])     . Raynaud disease   . Shortness of breath dyspnea   . Anginal pain ([place])   . Edema   . Hyperlipemia   . Ovarian cancer ([place])     chemo/rad  . 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 Vulvar cancer [facility])     Patient Active Problem List   Diagnosis Date Noted  . CHF (congestive heart failure) ([place]) 09/07/2015  . COPD exacerbation ([place]) 09/07/2015  . Scleroderma ([place]) 06/12/2015  . ARF (acute renal failure) ([place]) 06/12/2015  . Intracranial bleed ([place]) 06/12/2015  . C7 cervical fracture ([place]) 06/12/2015  . CAD (coronary artery disease) s/p PCI. 06/12/2015  . Chronic anemia 06/12/2015  . Acute encephalopathy 06/12/2015  . Orthostatic hypotension 06/12/2015  . 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 Raynaud disease 12/28/2014  . Chronic systolic heart failure ([place]) 12/28/2014    Past Surgical History  Procedure Laterality Date  . Back surgery  1990  . Vulva surgery    . Nasal sinus surgery    . Cervical fusion    . Abdominal hysterectomy    . Cardiac catheterization  12/23/2013  . Coronary angioplasty with stent placement  12/23/2013  . Cardiac catheterization N/A 01/10/2015    Procedure: Left Heart Cath and Coronary Angiography;  Surgeon: Dionisio David, MD;  Location: [place] [facility];  Service: Cardiovascular;  Laterality: N/A;  . Breast biopsy Left 01/15/12    neg  . Breast biopsy Left  neg bx/clip  . Breast biopsy Right     neg-bx/clip    Current Outpatient Rx  Name  Route  Sig  Dispense  Refill  . acetaminophen (TYLENOL) 325 MG tablet   Oral   Take 325 mg by mouth every 4 (four) hours as needed for mild pain, moderate pain or fever.         . ALPRAZolam (XANAX) 0.25 MG tablet   Oral   Take 1 tablet (0.25 mg total) by mouth 3 (three) times daily as needed for anxiety.   24 tablet   0   . aspirin EC 81 MG tablet   Oral   Take 81 mg by mouth daily.         . caffeine 200 MG TABS tablet   Oral   Take 200 mg by mouth once.         . Fenofibrate 40 MG TABS   Oral    Take 20 mg by mouth daily. Reported on 01/03/2016         . spironolactone (ALDACTONE) 25 MG tablet   Oral   Take 1 tablet (25 mg total) by mouth daily.   30 tablet   0     Allergies Other  Family History  Problem Relation Age of Onset  . Hypertension Other     Social History Social History  Substance Use Topics  . Smoking status: Former Smoker    Types: Cigarettes    Quit date: 07/12/2013  . Smokeless tobacco: Never Used  . Alcohol Use: No    Review of Systems Constitutional: No fever/chills.  Increasing lethargy and generalized weakness. Eyes: No visual changes. ENT: No sore throat. Cardiovascular: Denies chest pain. Respiratory: Denies shortness of breath. Gastrointestinal: No abdominal pain.  Frequent nausea and vomiting with decreased oral intake. Genitourinary: Negative for dysuria. Musculoskeletal: Negative for back pain. Skin: Negative for rash. Neurological: Negative for headaches, focal weakness or numbness.  Generalized weakness and lethargy.  10-point ROS otherwise negative.  ____________________________________________   PHYSICAL EXAM:  VITAL SIGNS: ED Triage Vitals  Enc Vitals Group     BP 01/10/16 1528 135/65 mmHg     Pulse Rate 01/10/16 1528 127     Resp 01/10/16 1528 31     Temp 01/10/16 1528 97.5 F (36.4 C)     Temp Source 01/10/16 1528 Oral     SpO2 --      Weight 01/10/16 1528 98 lb (44.453 kg)     Height 01/10/16 1528 5' (1.524 m)     Head Cir --      Peak Flow --      Pain Score 01/10/16 1534 8     Pain Loc --      Pain Edu? --      Excl. in [place]? --     Constitutional: Alert and oriented. Toxic appearance but definitely influenced by the patient's chronic cyanotic coloration.  Cachectic. Eyes: Conjunctivae are normal. PERRL. EOMI. Head: Atraumatic. Nose: No congestion/rhinnorhea. Mouth/Throat: Mucous membranes are dry.  Oropharynx non-erythematous. Neck: No stridor.  No meningeal signs.   Cardiovascular: Tachycardia,  regular rhythm. Good peripheral circulation. Grossly normal heart sounds.   Respiratory: Normal respiratory effort.  No retractions. Lungs CTAB. Gastrointestinal: Soft and nontender. No distention.  Musculoskeletal: No lower extremity tenderness nor edema. No gross deformities of extremities. Neurologic:  Normal speech and language. No gross focal neurologic deficits are appreciated.  Skin:  Skin is warm, dry and intact. No rash noted. Psychiatric: Mood and  affect are normal. Speech and behavior are normal.  ____________________________________________   LABS (all labs ordered are listed, but only abnormal results are displayed)  Labs Reviewed  CBC - Abnormal; Notable for the following:    WBC 14.3 (*)    Hemoglobin 11.8 (*)    MCHC 31.2 (*)    RDW 26.6 (*)    Platelets 113 (*)    All other components within normal limits  CULTURE, BLOOD (ROUTINE X 2)  CULTURE, BLOOD (ROUTINE X 2)  URINE CULTURE  BASIC METABOLIC PANEL  URINALYSIS COMPLETEWITH MICROSCOPIC ([facility] ONLY)  LACTIC ACID, PLASMA  LACTIC ACID, PLASMA  LIPASE, BLOOD  TROPONIN I  BLOOD GAS, ARTERIAL  APTT  PROTIME-INR  CBG MONITORING, ED   ____________________________________________  EKG  ED ECG REPORT I, Kristinia Leavy, the attending physician, personally viewed and interpreted this ECG.   Date: 01/10/2016  EKG Time: 15:27  Rate: 128  Rhythm: sinus tachycardia  Axis: Normal  Intervals:right bundle branch block  ST&T Change: The patient has significant ST depression in leads V5 and V6, as well as ST elevation in aVR and V1.  Additionally she has a new right bundle branch block.  Her EKG overall is very concerning for acute ischemia although it does not meet STEMI criteria.  ____________________________________________  RADIOLOGY   Dg Chest Port 1 View  01/10/2016  CLINICAL DATA:  Sepsis EXAM: PORTABLE CHEST 1 VIEW COMPARISON:  09/07/2015 chest radiograph. FINDINGS: Stable cardiomediastinal silhouette with  mild cardiomegaly. No pneumothorax. No pleural effusion. No overt pulmonary edema. No acute consolidative airspace disease. Healed deformities in the bilateral clavicles and left lateral eighth and ninth ribs. IMPRESSION: Stable mild cardiomegaly without overt pulmonary edema. No acute pulmonary disease. Electronically Signed   By: Ilona Sorrel M.D.   On: 01/10/2016 16:31    ____________________________________________   PROCEDURES  Procedure(s) performed: None  Critical Care performed: Yes, see critical care note(s)   CRITICAL CARE Performed by: Hinda Kehr   Total critical care time: 60 minutes  Critical care time was exclusive of separately billable procedures and treating other patients.  Critical care was necessary to treat or prevent imminent or life-threatening deterioration.  Critical care was time spent personally by me on the following activities: development of treatment plan with patient and/or surrogate as well as nursing, discussions with consultants, evaluation of patient's response to treatment, examination of patient, obtaining history from patient or surrogate, ordering and performing treatments and interventions, ordering and review of laboratory studies, ordering and review of radiographic studies, pulse oximetry and re-evaluation of patient's condition.  ____________________________________________   INITIAL IMPRESSION / ASSESSMENT AND PLAN / ED COURSE  Pertinent labs & imaging results that were available during my care of the patient were reviewed by me and considered in my medical decision making (see chart for details).  The patient is quite ill appearing upon arrival.  It is reassuring to know that her discoloration as chronic and that she is alert and oriented, but her vital signs are notable for tachycardia and tachypnea.  I am initiating sepsis protocol and code sepsis but will also contact in-house cardiology as soon as possible regarding her ischemic  EKG changes.  I suspect it is due to demand ischemia from her volume depletion and/or sepsis, but I will request cardiology evaluation as well.  4:16 PM: Spoke by phone with the PA of Dr. Ellyn Hack (in-house cardiology).  He agrees EKG does not meet STEMI requirements, but is concerning.  I asked him to have Dr.  Ellyn Hack call me at earliest convenience.  Proceeding with code sepsis working including empiric antibiotics and 30 mL/kg IV fluids as well as ABG given inability to check pulse ox.  4:35 PM:  Spoke extensively with Dr. Ellyn Hack by phone.  He concurred that it is concerning EKG but does not meet STEMI criteria.  In addition she has enough acute medical problems right now that she would be a poor candidate for the catheter lab, including apparent acute renal failure, hyperkalemia, possible sepsis, etc.  We agreed we would continue with a broad workup and reevaluate and see if her ischemic changes improve after fluid boluses.  In the meantime her labs have come back and are most notable for hyperkalemia.  I will start addressing that with medical management as well while she is also getting the fluid boluses.  (Note that documentation was delayed due to multiple ED patients requiring immediate care.)  The patient's septic workup was unremarkable and terms of a source.  However she does have a leukocytosis.  Additionally she has numerous abnormal lab values including an elevated troponin of 0.4 (she has had a consistently elevated but in the past but no higher than 0.2), a lactic acid of 5.6, and anion gap of 18, sodium of 131, and acute renal failure with a creatinine 1.9.  Her ABG is notable for a pH of 7.29, PCO2 of 20 and an acid base deficit of 15.1.  I gave her an extra liter of fluid over the 1.5 recommended in terms of 30 mL/kg.  I spoke in person with the intensivist who was in the emergency department and he suggested that given her hemodynamic stability she would be appropriate for admission by  the hospitalist.  I then spoke with Dr. Posey Pronto the hospitalist who will admit.  Although she does have an elevated troponin I held off on treating her with heparin given that this is likely stress induced and I deferred to the hospitalist.  ____________________________________________  FINAL CLINICAL IMPRESSION(S) / ED DIAGNOSES  Final diagnoses:  Sepsis, due to unspecified organism (HCC)  Lactic acidosis  Acute renal failure, unspecified acute renal failure type (HCC)  Hyperkalemia  Cardiac ischemia  Elevated troponin I level     MEDICATIONS GIVEN DURING THIS VISIT:  Medications  sodium chloride 0.9 % bolus 1,000 mL    And  sodium chloride 0.9 % bolus 500 mL  piperacillin-tazobactam (ZOSYN) IVPB 3.375 g   vancomycin (VANCOCIN) IVPB 1000 mg/200 mL premix      NEW OUTPATIENT MEDICATIONS STARTED DURING THIS VISIT:  New Prescriptions   No medications on file      Note:  This document was prepared using Dragon voice recognition software and may include unintentional dictation errors.   Hinda Kehr, MD 01/11/16 [phone]

## 2016-01-10 NOTE — Progress Notes (Signed)
Dr. Emmit Alexanders notified of pts blood sugar, recheck to be done by IV stick blood sample, advised of pt not voiding since arrival to ER, order for foley received

## 2016-01-11 ENCOUNTER — Inpatient Hospital Stay
Admit: 2016-01-11 | Discharge: 2016-01-11 | Disposition: A | Discharge status: Home / Self Care | Payer: Medicare Other | Attending: Cardiovascular Disease | Admitting: Cardiovascular Disease

## 2016-01-11 ENCOUNTER — Inpatient Hospital Stay: Payer: Medicare Other

## 2016-01-11 DIAGNOSIS — E872 Acidosis: Secondary | ICD-10-CM

## 2016-01-11 LAB — BLOOD GAS, ARTERIAL
ACID-BASE DEFICIT: 9.9 mmol/L — AB (ref 0.0–2.0)
Allens test (pass/fail): POSITIVE — AB
Bicarbonate: 14.1 mEq/L — ABNORMAL LOW (ref 21.0–28.0)
FIO2: 0.36
FIO2: 0.24
O2 Saturation: 97.6 %
PCO2 ART: 16 mmHg — AB (ref 32.0–48.0)
PH ART: 7.19 — AB (ref 7.350–7.450)
Patient temperature: 37
Patient temperature: 37
pCO2 arterial: 25 mmHg — ABNORMAL LOW (ref 32.0–48.0)
pH, Arterial: 7.36 (ref 7.350–7.450)
pO2, Arterial: 122 mmHg — ABNORMAL HIGH (ref 83.0–108.0)
pO2, Arterial: 102 mmHg (ref 83.0–108.0)

## 2016-01-11 LAB — LACTIC ACID, PLASMA
Lactic Acid, Venous: 8 mmol/L (ref 0.5–2.0)
Lactic Acid, Venous: 9 mmol/L (ref 0.5–2.0)

## 2016-01-11 LAB — BASIC METABOLIC PANEL
ANION GAP: 18 — AB (ref 5–15)
BUN: 23 mg/dL — ABNORMAL HIGH (ref 6–20)
CALCIUM: 8 mg/dL — AB (ref 8.9–10.3)
CO2: 10 mmol/L — AB (ref 22–32)
Chloride: 107 mmol/L (ref 101–111)
Creatinine, Ser: 1.51 mg/dL — ABNORMAL HIGH (ref 0.44–1.00)
GFR, EST AFRICAN AMERICAN: 41 mL/min — AB (ref >60)
GFR, EST NON AFRICAN AMERICAN: 35 mL/min — AB (ref >60)
Glucose, Bld: 91 mg/dL (ref 65–99)
POTASSIUM: 5.4 mmol/L — AB (ref 3.5–5.1)
Sodium: 135 mmol/L (ref 135–145)

## 2016-01-11 LAB — COMPREHENSIVE METABOLIC PANEL
ALBUMIN: 2.1 g/dL — AB (ref 3.5–5.0)
ALK PHOS: 149 U/L — AB (ref 38–126)
ALT: 213 U/L — ABNORMAL HIGH (ref 14–54)
ANION GAP: 14 (ref 5–15)
AST: 261 U/L — AB (ref 15–41)
BUN: 21 mg/dL — AB (ref 6–20)
CALCIUM: 7.2 mg/dL — AB (ref 8.9–10.3)
CO2: 17 mmol/L — AB (ref 22–32)
Chloride: 105 mmol/L (ref 101–111)
Creatinine, Ser: 1.41 mg/dL — ABNORMAL HIGH (ref 0.44–1.00)
GFR calc Af Amer: 44 mL/min — ABNORMAL LOW (ref >60)
GFR calc non Af Amer: 38 mL/min — ABNORMAL LOW (ref >60)
GLUCOSE: 132 mg/dL — AB (ref 65–99)
Potassium: 3.6 mmol/L (ref 3.5–5.1)
SODIUM: 136 mmol/L (ref 135–145)
Total Bilirubin: 1.3 mg/dL — ABNORMAL HIGH (ref 0.3–1.2)
Total Protein: 4.9 g/dL — ABNORMAL LOW (ref 6.5–8.1)

## 2016-01-11 LAB — DIFFERENTIAL
BAND NEUTROPHILS: 11 %
BASOS ABS: 0 10*3/uL (ref 0–0.1)
BASOS PCT: 0 %
BLASTS: 0 %
EOS ABS: 0 10*3/uL (ref 0–0.7)
EOS PCT: 0 %
LYMPHS ABS: 1.6 10*3/uL (ref 1.0–3.6)
Lymphocytes Relative: 8 %
METAMYELOCYTES PCT: 1 %
MONO ABS: 0.2 10*3/uL (ref 0.2–0.9)
Monocytes Relative: 1 %
Myelocytes: 0 %
NEUTROS ABS: 17.7 10*3/uL — AB (ref 1.4–6.5)
Neutrophils Relative %: 79 %
Other: 0 %
Promyelocytes Absolute: 0 %
nRBC: 0 /100 WBC

## 2016-01-11 LAB — HEPATIC FUNCTION PANEL
ALBUMIN: 2.3 g/dL — AB (ref 3.5–5.0)
ALT: 227 U/L — ABNORMAL HIGH (ref 14–54)
AST: 425 U/L — AB (ref 15–41)
Alkaline Phosphatase: 189 U/L — ABNORMAL HIGH (ref 38–126)
Bilirubin, Direct: 0.8 mg/dL — ABNORMAL HIGH (ref 0.1–0.5)
Indirect Bilirubin: 0.8 mg/dL (ref 0.3–0.9)
TOTAL PROTEIN: 5.1 g/dL — AB (ref 6.5–8.1)
Total Bilirubin: 1.6 mg/dL — ABNORMAL HIGH (ref 0.3–1.2)

## 2016-01-11 LAB — PROTIME-INR
INR: 2
Prothrombin Time: 22.6 seconds — ABNORMAL HIGH (ref 11.4–15.0)

## 2016-01-11 LAB — POTASSIUM: POTASSIUM: 3 mmol/L — AB (ref 3.5–5.1)

## 2016-01-11 LAB — CK: CK TOTAL: 1031 U/L — AB (ref 38–234)

## 2016-01-11 LAB — CBC
HEMATOCRIT: 35.1 % (ref 35.0–47.0)
Hemoglobin: 10.6 g/dL — ABNORMAL LOW (ref 12.0–16.0)
MCH: 27.3 pg (ref 26.0–34.0)
MCHC: 30.2 g/dL — ABNORMAL LOW (ref 32.0–36.0)
MCV: 90.4 fL (ref 80.0–100.0)
Platelets: 89 10*3/uL — ABNORMAL LOW (ref 150–440)
RBC: 3.89 MIL/uL (ref 3.80–5.20)
RDW: 26.1 % — AB (ref 11.5–14.5)
WBC: 20 10*3/uL — AB (ref 3.6–11.0)

## 2016-01-11 LAB — GLUCOSE, CAPILLARY: GLUCOSE-CAPILLARY: 123 mg/dL — AB (ref 65–99)

## 2016-01-11 LAB — CARBOXYHEMOGLOBIN
CARBOXYHEMOGLOBIN: 2.1 % (ref 1.5–9.0)
METHEMOGLOBIN: 1.5 %
TOTAL OXYGEN CONTENT: 95.1 mL/dL

## 2016-01-11 LAB — TROPONIN I
TROPONIN I: 1.57 ng/mL — AB (ref <0.031)
Troponin I: 1.34 ng/mL — ABNORMAL HIGH (ref <0.031)

## 2016-01-11 MED ORDER — ENOXAPARIN SODIUM 30 MG/0.3ML ~~LOC~~ SOLN
30.0000 mg | SUBCUTANEOUS | Status: DC
  Administered 2016-01-11: 30 mg via SUBCUTANEOUS
  Filled 2016-01-11: qty 0.3

## 2016-01-11 MED ORDER — TECHNETIUM TO 99M ALBUMIN AGGREGATED
4.2690 | Freq: Once | INTRAVENOUS | Status: AC | PRN
  Administered 2016-01-11: 4.269 via INTRAVENOUS

## 2016-01-11 MED ORDER — TECHNETIUM TC 99M DIETHYLENETRIAME-PENTAACETIC ACID
35.1610 | Freq: Once | INTRAVENOUS | Status: AC | PRN
  Administered 2016-01-11: 35.161 via INTRAVENOUS

## 2016-01-11 MED ORDER — SODIUM ACETATE 2 MEQ/ML IV SOLN: INTRAVENOUS | Status: DC

## 2016-01-11 MED ORDER — SODIUM BICARBONATE 8.4 % IV SOLN: INTRAVENOUS | Status: DC

## 2016-01-11 MED ORDER — DEXTROSE 5 % IV SOLN
INTRAVENOUS | Status: DC
  Administered 2016-01-11: 06:00:00 via INTRAVENOUS
  Filled 2016-01-11 (×4): qty 1000

## 2016-01-11 MED ORDER — SODIUM CHLORIDE 0.9 % IV BOLUS (SEPSIS)
1000.0000 mL | Freq: Once | INTRAVENOUS | Status: AC
  Administered 2016-01-11: 1000 mL via INTRAVENOUS

## 2016-01-11 MED ORDER — SODIUM CHLORIDE 0.9 % IV BOLUS (SEPSIS)
500.0000 mL | Freq: Once | INTRAVENOUS | Status: AC
  Administered 2016-01-11: 500 mL via INTRAVENOUS

## 2016-01-11 NOTE — Progress Notes (Signed)
Dr. Ashby Dawes updated on lactic acid of 9.0

## 2016-01-11 NOTE — Progress Notes (Signed)
[place] [facility] [place] NAME: Cheryl Hamilton    MR#:  [id number]  DATE OF BIRTH:  December 13, 1950  SUBJECTIVE:  CHIEF COMPLAINT:   Chief Complaint  Patient presents with  . Fatigue  Patient is 65 year old Caucasian female with past medical history significant for vulvar cancer, scleroderma, who presents to the hospital with complaints of dizziness, especially whenever she stands up, shortness of breath. On arrival to the hospital patient was noted to be tachycardic, acidotic. Labs revealed chronic renal insufficiency, although worsened in the past one month, hyperkalemia, elevated transaminases. Lactic acid level of 9.0. At its peak, elevated troponin, leukocytosis, thrombocytopenia. Due to concerns of infection/sepsis. Patient was initiated on broad-spectrum antibiotic therapy, blood cultures are pending, MRSA screen was negative, urine culture is pending. Patient's complaining of shortness of breath, dizziness  Review of Systems  Constitutional: Positive for malaise/fatigue. Negative for fever, chills and weight loss.  HENT: Negative for congestion.   Eyes: Negative for blurred vision and double vision.  Respiratory: Positive for shortness of breath. Negative for cough, sputum production and wheezing.   Cardiovascular: Negative for chest pain, palpitations, orthopnea, leg swelling and PND.  Gastrointestinal: Negative for nausea, vomiting, abdominal pain, diarrhea, constipation and blood in stool.  Genitourinary: Negative for dysuria, urgency, frequency and hematuria.  Musculoskeletal: Negative for falls.  Neurological: Positive for dizziness. Negative for tremors, focal weakness and headaches.  Endo/Heme/Allergies: Does not bruise/bleed easily.  Psychiatric/Behavioral: Negative for depression. The patient does not have insomnia.     VITAL SIGNS: Blood pressure 147/80, pulse 57, temperature 96.8 F (36 C), temperature source Rectal, resp. rate  30, height 5' (1.524 m), weight 50 kg (110 lb 3.7 oz), SpO2 94 %.  PHYSICAL EXAMINATION:   GENERAL:  65 y.o.-year-old patient lying in the bed in mild-to-moderate respiratory distress, tachypneic. Cyanotic EYES: Pupils equal, round, reactive to light and accommodation. No scleral icterus. Extraocular muscles intact.  HEENT: Head atraumatic, normocephalic. Oropharynx and nasopharynx clear.  NECK:  Supple, no jugular venous distention. No thyroid enlargement, no tenderness.  LUNGS: Normal breath sounds bilaterally, no wheezing, rales,rhonchi or crepitation. Intermittently using accessory muscles of respiration, especially with longer sentences, moving.  CARDIOVASCULAR: S1, S2 , tachycardic.  No murmurs, rubs, or gallops.  ABDOMEN: Soft, nontender, nondistended. Bowel sounds present. No organomegaly or mass.  EXTREMITIES: No pedal edema, bilateral upper extremity and lower extremity cyanosis, and clubbing.  NEUROLOGIC: Cranial nerves II through XII are intact. Muscle strength 5/5 in all extremities. Sensation intact. Gait not checked.  PSYCHIATRIC: The patient is alert , intermittently confused  SKIN: No obvious rash, lesion, or ulcer. Cool acral extremities to touch    ORDERS/RESULTS REVIEWED:   CBC  Recent Labs Lab 01/10/16 1541 01/11/16 0340  WBC 14.3* 20.0*  HGB 11.8* 10.6*  HCT 37.8 35.1  PLT 113* 89*  MCV 87.7 90.4  MCH 27.4 27.3  MCHC 31.2* 30.2*  RDW 26.6* 26.1*   ------------------------------------------------------------------------------------------------------------------  Chemistries   Recent Labs Lab 01/10/16 1541 01/10/16 2054 01/11/16 0340  NA 131*  --  135  K 6.5*  --  5.4*  CL 102  --  107  CO2 11*  --  10*  GLUCOSE 152*  --  91  BUN 25*  --  23*  CREATININE 1.91* 1.70* 1.51*  CALCIUM 8.3*  --  8.0*  MG 1.9  --   --   AST  --   --  425*  ALT  --   --  227*  ALKPHOS  --   --  189*  BILITOT  --   --  1.6*    ------------------------------------------------------------------------------------------------------------------ estimated creatinine clearance is 26.7 mL/min (by C-G formula based on Cr of 1.51). ------------------------------------------------------------------------------------------------------------------  Recent Labs  01/10/16 2054  TSH 2.164    Cardiac Enzymes  Recent Labs Lab 01/10/16 2054 01/11/16 0029 01/11/16 0340  TROPONINI 0.91* 1.34* 1.57*   ------------------------------------------------------------------------------------------------------------------ Invalid input(s): POCBNP ---------------------------------------------------------------------------------------------------------------  RADIOLOGY: Nm Pulmonary Perf And Vent  01/11/2016  CLINICAL DATA:  Shortness of Breath EXAM: NUCLEAR MEDICINE VENTILATION - PERFUSION LUNG SCAN Views: Anterior, posterior, left lateral, right lateral, RPO, LPO, RAO, LAO -ventilation and perfusion RADIOPHARMACEUTICALS:  35.161 mCi Technetium-62m DTPA aerosol inhalation and 4.269 mCi Technetium-35m MAA IV COMPARISON:  Chest radiograph Jan 10, 2016 FINDINGS: Ventilation: There are a few scattered subsegmental foci of decreased attenuation. There is no segmental ventilation defect on either side. Perfusion: There are a few matching subsegmental lower lobe perfusion defects. There is no appreciable ventilation/perfusion mismatch, and there are no perfusion defects at a segmental or nearly segmental level. Heart is noted to be prominent. IMPRESSION: No appreciable ventilation/ perfusion mismatch. Small subsegmental matching ventilation and perfusion defects. These findings constitute a low probability of pulmonary embolus. Electronically Signed   By: Lowella Grip III M.D.   On: 01/11/2016 14:22   Dg Chest Port 1 View  01/10/2016  CLINICAL DATA:  Sepsis EXAM: PORTABLE CHEST 1 VIEW COMPARISON:  09/07/2015 chest radiograph. FINDINGS: Stable  cardiomediastinal silhouette with mild cardiomegaly. No pneumothorax. No pleural effusion. No overt pulmonary edema. No acute consolidative airspace disease. Healed deformities in the bilateral clavicles and left lateral eighth and ninth ribs. IMPRESSION: Stable mild cardiomegaly without overt pulmonary edema. No acute pulmonary disease. Electronically Signed   By: Ilona Sorrel M.D.   On: 01/10/2016 16:31    EKG:  Orders placed or performed during the hospital encounter of 01/10/16  . EKG 12-Lead  . EKG 12-Lead  . ED EKG  . ED EKG  . EKG 12-Lead  . EKG 12-Lead    ASSESSMENT AND PLAN:  Active Problems:   Sepsis ([place]) #1 Acute respiratory failure with hypoxia, likely due to scleroderma, no obvious pulmonary embolus on VQ scan, continue oxygen therapy as needed, keeping pulse oximetry at 94% and above #2. Dizziness, suspect due to pulmonary hypertension, continue oxygen therapy as needed #3 Acidosis, suspect shunting, intracardiac versus pulmonary due to pulmonary hypertension, continue supportive therapy, repeated ABGs are improved #4. Acute on chronic renal insufficiency, CKD stage 3, follow with therapy #5. Hyperkalemia, recheck potassium level stat, patient is on blood alkalinization with citrate to follow in the morning as well, patient was given dose of Kayexalate #6. Elevated transaminases, get CK level stat #7. Leukocytosis, follow with therapy #8,thrombocytopenia, get CBC with differential to rule out TTP/HUS Management plans discussed with the patient, family and they are in agreement.   DRUG ALLERGIES:  Allergies  Allergen Reactions  . Other Itching and Other (See Comments)    Pt states that she is allergic to most narcotics.      CODE STATUS:     Code Status Orders        Start     Ordered   01/10/16 1829  Full code   Continuous     01/10/16 1829    Code Status History    Date Active Date Inactive Code Status Order ID Comments User Context   09/07/2015  9:36  AM 09/10/2015  6:23 PM Full Code [id number]  Hillary Bow, MD ED   06/12/2015  3:37 AM 06/15/2015  3:42 PM Full Code [id number]  Rise Patience, MD Inpatient   06/12/2015 12:19 AM 06/12/2015  3:37 AM Full Code [id number]  Rise Patience, MD Inpatient    Advance Directive Documentation        Most Recent Value   Type of Advance Directive  Healthcare Power of [place], Living will   Pre-existing out of facility DNR order (yellow form or pink MOST form)     "MOST" Form in Place?        TOTAL Critical care TIME TAKING CARE OF THIS PATIENT: 45 minutes.    Theodoro Grist M.D on 01/11/2016 at 3:33 PM  Between 7am to 6pm - Pager - [phone]  After 6pm go to [email or web address] - password [email or web address] [facility]  [facility]  Office  [phone]  CC: Primary care physician; [facility], Chrissie Noa, MD

## 2016-01-11 NOTE — Progress Notes (Signed)
Anticoagulation monitoring(Lovenox):  65 yo F ordered Lovenox 40 mg Q24h  Filed Weights   01/10/16 1641 01/10/16 2000 01/11/16 0600  Weight: 102 lb (46.267 kg) 101 lb 6.6 oz (46 kg) 110 lb 3.7 oz (50 kg)   BMI 20  Lab Results  Component Value Date   CREATININE 1.51* 01/11/2016   CREATININE 1.70* 01/10/2016   CREATININE 1.91* 01/10/2016   Estimated Creatinine Clearance: 26.7 mL/min (by C-G formula based on Cr of 1.51). Hemoglobin & Hematocrit     Component Value Date/Time   HGB 10.6* 01/11/2016 0340   HGB 10.0* 11/26/2013 0108   HCT 35.1 01/11/2016 0340   HCT 28.9* 11/26/2013 0108     Per Protocol for Patient with estCrcl< 30 ml/min and BMI < 40, will transition to Lovenox 30 mg Q24h.     Ulice Dash, PharmD Clinical Pharmacist

## 2016-01-11 NOTE — Progress Notes (Signed)
eLink Physician-Brief Progress Note Patient Name: Cheryl Hamilton [facility] DOB: Jul 08, 1951 MRN: [id number]   Date of Service  01/11/2016  HPI/Events of Note  Progressively increasing lactic acid, acidosis.   eICU Interventions  Ordered ABG, methemoglobin levels, LFT, chem. Started on bicarb (changed to citrate by pharm).  Will order CCM consult to be assessed by PCCM in am.         Laverle Hobby 01/11/2016, 5:00 AM

## 2016-01-11 NOTE — Progress Notes (Signed)
Dr. Ashby Dawes notified of lactic acid 8.0, orders received

## 2016-01-11 NOTE — Progress Notes (Signed)
Repeat Lactate results of 8 reported to Dr Ashby Dawes. Orders received.

## 2016-01-11 NOTE — Progress Notes (Signed)
.s    Cheryl Hamilton is a 65 y.o. female  [id number]  Primary Cardiologist: [facility] Reason for Consultation: elevated troponins  HPI: Patient is well known to me with scleroderma, s/p PCI LV dysfunction presented with dizziness and weakness, no chest pains.   Review of Systems: Patient is short of breath   Past Medical History  Diagnosis Date  . CHF (congestive heart failure) ([place])   . Hypertension   . Coronary artery disease   . Renal insufficiency   . Scleroderma ([place])   . Chronic back pain   . GERD (gastroesophageal reflux disease)   . Blue skin   . COPD (chronic obstructive pulmonary disease) ([place])   . Raynaud disease   . Shortness of breath dyspnea   . Anginal pain ([place])   . Edema   . Hyperlipemia   . Ovarian cancer ([place])     chemo/rad  . Vaginal cancer ([place])   . Vulvar cancer ([place])     Medications Prior to Admission  Medication Sig Dispense Refill  . albuterol (PROVENTIL HFA;VENTOLIN HFA) 108 (90 Base) MCG/ACT inhaler Inhale 2 puffs into the lungs every 6 (six) hours as needed for wheezing or shortness of breath.    . ALPRAZolam (XANAX) 0.25 MG tablet Take 1 tablet (0.25 mg total) by mouth 3 (three) times daily as needed for anxiety. 24 tablet 0  . amitriptyline (ELAVIL) 25 MG tablet Take 25 mg by mouth at bedtime.    Marland Kitchen aspirin EC 81 MG tablet Take 81 mg by mouth daily.    . caffeine 200 MG TABS tablet Take 200 mg by mouth daily.     . clopidogrel (PLAVIX) 75 MG tablet Take 75 mg by mouth daily.    . fenofibrate (TRICOR) 145 MG tablet Take 145 mg by mouth daily.    . furosemide (LASIX) 20 MG tablet Take 20 mg by mouth 2 (two) times daily.    . metoprolol succinate (TOPROL-XL) 50 MG 24 hr tablet Take 50 mg by mouth daily. Take with or immediately following a meal.    . potassium chloride (MICRO-K) 10 MEQ CR capsule Take 10 mEq by mouth 3 (three) times daily.    . simvastatin (ZOCOR) 20 MG tablet Take 20 mg by mouth at bedtime.    Marland Kitchen spironolactone  (ALDACTONE) 25 MG tablet Take 12.5 mg by mouth daily.    . traMADol (ULTRAM) 50 MG tablet Take 50 mg by mouth every 6 (six) hours as needed for moderate pain.       Marland Kitchen amitriptyline  25 mg Oral QHS  . antiseptic oral rinse  7 mL Mouth Rinse BID  . aspirin EC  81 mg Oral Daily  . clopidogrel  75 mg Oral Daily  . enoxaparin (LOVENOX) injection  40 mg Subcutaneous Q24H  . fenofibrate  160 mg Oral Daily  . metoprolol  5 mg Intravenous Q6H  . metoprolol succinate  50 mg Oral Daily  . piperacillin-tazobactam (ZOSYN)  IV  3.375 g Intravenous Q8H  . simvastatin  20 mg Oral QHS  . sodium chloride flush  3 mL Intravenous Q12H  . vancomycin  750 mg Intravenous Q36H    Infusions: . dextrose 5 % 1,000 mL with sodium acetate 150 mEq infusion 150 mL/hr at 01/11/16 0700    Allergies  Allergen Reactions  . Other Itching and Other (See Comments)    Pt states that she is allergic to most narcotics.      Social History   Social  History  . Marital Status: Single    Spouse Name: N/A  . Number of Children: N/A  . Years of Education: N/A   Occupational History  . Not on file.   Social History Main Topics  . Smoking status: Former Smoker    Types: Cigarettes    Quit date: 07/12/2013  . Smokeless tobacco: Never Used  . Alcohol Use: No  . Drug Use: No  . Sexual Activity: Not on file   Other Topics Concern  . Not on file   Social History Narrative    Family History  Problem Relation Age of Onset  . Hypertension Other     PHYSICAL EXAM: Filed Vitals:   01/11/16 0700 01/11/16 0800  BP: 137/90 138/88  Pulse:  57  Temp:  96.8 F (36 C)  Resp: 30 29     Intake/Output Summary (Last 24 hours) at 01/11/16 0914 Last data filed at 01/11/16 0800  Gross per 24 hour  Intake 2053.33 ml  Output    300 ml  Net 1753.33 ml    General:  Well appearing. No respiratory difficulty HEENT: normal Neck: supple. no JVD. Carotids 2+ bilat; no bruits. No lymphadenopathy or thryomegaly  appreciated. Cor: PMI nondisplaced. Regular rate & rhythm. No rubs, gallops or murmurs. Lungs: clear Abdomen: soft, nontender, nondistended. No hepatosplenomegaly. No bruits or masses. Good bowel sounds. Extremities: no cyanosis, clubbing, rash, edema Neuro: alert & oriented x 3, cranial nerves grossly intact. moves all 4 extremities w/o difficulty. Affect pleasant.  ECG: Sinus tachycardia with aswmi and st depression inferolaterally  Results for orders placed or performed during the hospital encounter of 01/10/16 (from the past 24 hour(s))  Basic metabolic panel     Status: Abnormal   Collection Time: 01/10/16  3:41 PM  Result Value Ref Range   Sodium 131 (L) 135 - 145 mmol/L   Potassium 6.5 (H) 3.5 - 5.1 mmol/L   Chloride 102 101 - 111 mmol/L   CO2 11 (L) 22 - 32 mmol/L   Glucose, Bld 152 (H) 65 - 99 mg/dL   BUN 25 (H) 6 - 20 mg/dL   Creatinine, Ser 1.91 (H) 0.44 - 1.00 mg/dL   Calcium 8.3 (L) 8.9 - 10.3 mg/dL   GFR calc non Af Amer 26 (L) >60 mL/min   GFR calc Af Amer 31 (L) >60 mL/min   Anion gap 18 (H) 5 - 15  CBC     Status: Abnormal   Collection Time: 01/10/16  3:41 PM  Result Value Ref Range   WBC 14.3 (H) 3.6 - 11.0 K/uL   RBC 4.31 3.80 - 5.20 MIL/uL   Hemoglobin 11.8 (L) 12.0 - 16.0 g/dL   HCT 37.8 35.0 - 47.0 %   MCV 87.7 80.0 - 100.0 fL   MCH 27.4 26.0 - 34.0 pg   MCHC 31.2 (L) 32.0 - 36.0 g/dL   RDW 26.6 (H) 11.5 - 14.5 %   Platelets 113 (L) 150 - 440 K/uL  Magnesium     Status: None   Collection Time: 01/10/16  3:41 PM  Result Value Ref Range   Magnesium 1.9 1.7 - 2.4 mg/dL  APTT     Status: None   Collection Time: 01/10/16  3:41 PM  Result Value Ref Range   aPTT 34 24 - 36 seconds  Lipase, blood     Status: None   Collection Time: 01/10/16  3:41 PM  Result Value Ref Range   Lipase 27 11 - 51 U/L  Protime-INR  Status: Abnormal   Collection Time: 01/10/16  3:41 PM  Result Value Ref Range   Prothrombin Time 23.2 (H) 11.4 - 15.0 seconds   INR 2.08    Troponin I     Status: Abnormal   Collection Time: 01/10/16  3:41 PM  Result Value Ref Range   Troponin I 0.41 (H) <0.031 ng/mL  Urinalysis complete, with microscopic ([facility] only)     Status: Abnormal   Collection Time: 01/10/16  4:15 PM  Result Value Ref Range   Color, Urine AMBER (A) YELLOW   APPearance CLEAR (A) CLEAR   Glucose, UA NEGATIVE NEGATIVE mg/dL   Bilirubin Urine NEGATIVE NEGATIVE   Ketones, ur NEGATIVE NEGATIVE mg/dL   Specific Gravity, Urine 1.016 1.005 - 1.030   Hgb urine dipstick 2+ (A) NEGATIVE   pH 5.0 5.0 - 8.0   Protein, ur 100 (A) NEGATIVE mg/dL   Nitrite NEGATIVE NEGATIVE   Leukocytes, UA NEGATIVE NEGATIVE   RBC / HPF 0-5 0 - 5 RBC/hpf   WBC, UA 0-5 0 - 5 WBC/hpf   Bacteria, UA NONE SEEN NONE SEEN   Squamous Epithelial / LPF NONE SEEN NONE SEEN   Hyaline Casts, UA PRESENT   Lactic acid, plasma     Status: Abnormal   Collection Time: 01/10/16  4:15 PM  Result Value Ref Range   Lactic Acid, Venous 5.6 (HH) 0.5 - 2.0 mmol/L  Blood gas, arterial ([facility], [facility], [facility])     Status: Abnormal   Collection Time: 01/10/16  4:15 PM  Result Value Ref Range   FIO2 36.00    Delivery systems NASAL CANNULA    pH, Arterial 7.29 (L) 7.350 - 7.450   pCO2 arterial 20 (LL) 32.0 - 48.0 mmHg   pO2, Arterial 141 (H) 83.0 - 108.0 mmHg   Bicarbonate 9.6 (L) 21.0 - 28.0 mEq/L   Acid-base deficit 15.1 (H) 0.0 - 2.0 mmol/L   O2 Saturation 98.9 %   Patient temperature 37.0    Collection site RIGHT RADIAL    Sample type ARTERIAL DRAW    Allens test (pass/fail) PASS PASS  Glucose, capillary     Status: Abnormal   Collection Time: 01/10/16  8:04 PM  Result Value Ref Range   Glucose-Capillary <10 (LL) 65 - 99 mg/dL  MRSA PCR Screening     Status: None   Collection Time: 01/10/16  8:20 PM  Result Value Ref Range   MRSA by PCR NEGATIVE NEGATIVE  Glucose, capillary     Status: Abnormal   Collection Time: 01/10/16  8:46 PM  Result Value Ref Range   Glucose-Capillary 109 (H) 65 - 99  mg/dL  Lactic acid, plasma     Status: Abnormal   Collection Time: 01/10/16  8:54 PM  Result Value Ref Range   Lactic Acid, Venous 7.1 (HH) 0.5 - 2.0 mmol/L  Creatinine, serum     Status: Abnormal   Collection Time: 01/10/16  8:54 PM  Result Value Ref Range   Creatinine, Ser 1.70 (H) 0.44 - 1.00 mg/dL   GFR calc non Af Amer 30 (L) >60 mL/min   GFR calc Af Amer 35 (L) >60 mL/min  TSH     Status: None   Collection Time: 01/10/16  8:54 PM  Result Value Ref Range   TSH 2.164 0.350 - 4.500 uIU/mL  Troponin I     Status: Abnormal   Collection Time: 01/10/16  8:54 PM  Result Value Ref Range   Troponin I 0.91 (H) <0.031 ng/mL  Troponin I     Status: Abnormal   Collection Time: 01/11/16 12:29 AM  Result Value Ref Range   Troponin I 1.34 (H) <0.031 ng/mL  Lactic acid, plasma     Status: Abnormal   Collection Time: 01/11/16 12:29 AM  Result Value Ref Range   Lactic Acid, Venous 8.0 (HH) 0.5 - 2.0 mmol/L  Glucose, capillary     Status: Abnormal   Collection Time: 01/11/16 12:30 AM  Result Value Ref Range   Glucose-Capillary 123 (H) 65 - 99 mg/dL  Troponin I     Status: Abnormal   Collection Time: 01/11/16  3:40 AM  Result Value Ref Range   Troponin I 1.57 (H) <0.031 ng/mL  CBC     Status: Abnormal   Collection Time: 01/11/16  3:40 AM  Result Value Ref Range   WBC 20.0 (H) 3.6 - 11.0 K/uL   RBC 3.89 3.80 - 5.20 MIL/uL   Hemoglobin 10.6 (L) 12.0 - 16.0 g/dL   HCT 35.1 35.0 - 47.0 %   MCV 90.4 80.0 - 100.0 fL   MCH 27.3 26.0 - 34.0 pg   MCHC 30.2 (L) 32.0 - 36.0 g/dL   RDW 26.1 (H) 11.5 - 14.5 %   Platelets 89 (L) 150 - 440 K/uL  Basic metabolic panel     Status: Abnormal   Collection Time: 01/11/16  3:40 AM  Result Value Ref Range   Sodium 135 135 - 145 mmol/L   Potassium 5.4 (H) 3.5 - 5.1 mmol/L   Chloride 107 101 - 111 mmol/L   CO2 10 (L) 22 - 32 mmol/L   Glucose, Bld 91 65 - 99 mg/dL   BUN 23 (H) 6 - 20 mg/dL   Creatinine, Ser 1.51 (H) 0.44 - 1.00 mg/dL   Calcium 8.0 (L)  8.9 - 10.3 mg/dL   GFR calc non Af Amer 35 (L) >60 mL/min   GFR calc Af Amer 41 (L) >60 mL/min   Anion gap 18 (H) 5 - 15  Lactic acid, plasma     Status: Abnormal   Collection Time: 01/11/16  3:40 AM  Result Value Ref Range   Lactic Acid, Venous 9.0 (HH) 0.5 - 2.0 mmol/L  Hepatic function panel     Status: Abnormal   Collection Time: 01/11/16  3:40 AM  Result Value Ref Range   Total Protein 5.1 (L) 6.5 - 8.1 g/dL   Albumin 2.3 (L) 3.5 - 5.0 g/dL   AST 425 (H) 15 - 41 U/L   ALT 227 (H) 14 - 54 U/L   Alkaline Phosphatase 189 (H) 38 - 126 U/L   Total Bilirubin 1.6 (H) 0.3 - 1.2 mg/dL   Bilirubin, Direct 0.8 (H) 0.1 - 0.5 mg/dL   Indirect Bilirubin 0.8 0.3 - 0.9 mg/dL  Blood gas, arterial     Status: Abnormal   Collection Time: 01/11/16  3:58 AM  Result Value Ref Range   FIO2 0.36    Delivery systems NASAL CANNULA    pH, Arterial 7.19 (LL) 7.350 - 7.450   pCO2 arterial 16 (LL) 32.0 - 48.0 mmHg   pO2, Arterial 122 (H) 83.0 - 108.0 mmHg   Patient temperature 37.0    Collection site RIGHT RADIAL    Sample type ARTERIAL DRAW    Allens test (pass/fail) PASS PASS  Carboxyhemoglobin     Status: None   Collection Time: 01/11/16  4:30 AM  Result Value Ref Range   Carboxyhemoglobin 2.1 1.5 - 9.0 %   Methemoglobin  1.5 %   Total oxygen content 95.1 mL/dL   Dg Chest Port 1 View  01/10/2016  CLINICAL DATA:  Sepsis EXAM: PORTABLE CHEST 1 VIEW COMPARISON:  09/07/2015 chest radiograph. FINDINGS: Stable cardiomediastinal silhouette with mild cardiomegaly. No pneumothorax. No pleural effusion. No overt pulmonary edema. No acute consolidative airspace disease. Healed deformities in the bilateral clavicles and left lateral eighth and ninth ribs. IMPRESSION: Stable mild cardiomegaly without overt pulmonary edema. No acute pulmonary disease. Electronically Signed   By: Ilona Sorrel M.D.   On: 01/10/2016 16:31     ASSESSMENT AND PLAN: Elevated troponins with sepsis and dizzines and metabolic acidosis.  Had EF 12/16 40%, will check echo and continue current treatment with asp/plavix and lovenox.  KHAN,SHAUKAT A

## 2016-01-11 NOTE — Care Management (Signed)
CM consult for home health needs. Attempted to meet with patient and she is pleasantly confused,intermittently. Patient lives at home with her disabled son. She uses a cane and a walker. PCP is Dr. Dorann Ou in [place]. Pharmacy is [facility] in [place]. She will need at PT consult for generalized weakness and fatigue.  Requested from primary nurse. Patient states she is agreeable to home health if needed. Following progression and discharge plan. Will reassess tomorrow.

## 2016-01-11 NOTE — Progress Notes (Signed)
*  PRELIMINARY RESULTS* Echocardiogram 2D Echocardiogram has been performed.  Cheryl Hamilton 01/11/2016, 12:52 PM

## 2016-01-11 NOTE — Progress Notes (Signed)
eLink Physician-Brief Progress Note Patient Name: Cheryl Hamilton [facility] DOB: 1951/07/27 MRN: [id number]   Date of Service  01/11/2016  HPI/Events of Note  Persistently elevated lactic acid.   eICU Interventions  Will give bolus of 1L NS. Pt has aequate access at this time, I see no urgent need for CVL's to follow CVP given the patient's clearly elevated lactic acid. Repeat lactic acid pending.      Intervention Category Major Interventions: Acid-Base disturbance - evaluation and management  Laverle Hobby 01/11/2016, 1:31 AM

## 2016-01-11 NOTE — Consult Note (Signed)
[facility] Consultation      Name: Cheryl Hamilton MRN: [id number] DOB: [birth year]    ADMISSION DATE:  01/10/2016    CHIEF COMPLAINT:   Dizziness and SOB   HISTORY OF PRESENT ILLNESS  65 yo white female with Scleroderma admitted to ICU for severe metabolic acidosis Patient is alert, intermittently confused. States that she has been feeling dizzy for 1 year But gotten worse over last couple of days. Patient with labile blood pressure, her symptoms seem to be related to orthostatic hypotension H/o including CHF, hypertension, coronary artery disease, chronic renal failure and scleroderma   ABG with ph 7.19, LA increasing 5.6>7.1>8>9 Patient started on bicarb infusion L     PAST MEDICAL HISTORY    :  Past Medical History  Diagnosis Date  . CHF (congestive heart failure) ([place])   . Hypertension   . Coronary artery disease   . Renal insufficiency   . Scleroderma ([place])   . Chronic back pain   . GERD (gastroesophageal reflux disease)   . Blue skin   . COPD (chronic obstructive pulmonary disease) ([place])   . Raynaud disease   . Shortness of breath dyspnea   . Anginal pain ([place])   . Edema   . Hyperlipemia   . Ovarian cancer ([place])     chemo/rad  . Vaginal cancer ([place])   . Vulvar cancer [facility])    Past Surgical History  Procedure Laterality Date  . Back surgery  1990  . Vulva surgery    . Nasal sinus surgery    . Cervical fusion    . Abdominal hysterectomy    . Cardiac catheterization  12/23/2013  . Coronary angioplasty with stent placement  12/23/2013  . Cardiac catheterization N/A 01/10/2015    Procedure: Left Heart Cath and Coronary Angiography;  Surgeon: Dionisio David, MD;  Location: [facility];  Service: Cardiovascular;  Laterality: N/A;  . Breast biopsy Left 01/15/12    neg  . Breast biopsy Left     neg bx/clip  . Breast biopsy Right     neg-bx/clip   Prior to Admission medications   Medication Sig Start Date End Date Taking?  Authorizing Provider  albuterol (PROVENTIL HFA;VENTOLIN HFA) 108 (90 Base) MCG/ACT inhaler Inhale 2 puffs into the lungs every 6 (six) hours as needed for wheezing or shortness of breath.   Yes Historical Provider, MD  ALPRAZolam (XANAX) 0.25 MG tablet Take 1 tablet (0.25 mg total) by mouth 3 (three) times daily as needed for anxiety. 09/10/15  Yes Vaughan Basta, MD  amitriptyline (ELAVIL) 25 MG tablet Take 25 mg by mouth at bedtime.   Yes Historical Provider, MD  aspirin EC 81 MG tablet Take 81 mg by mouth daily.   Yes Historical Provider, MD  caffeine 200 MG TABS tablet Take 200 mg by mouth daily.    Yes Historical Provider, MD  clopidogrel (PLAVIX) 75 MG tablet Take 75 mg by mouth daily.   Yes Historical Provider, MD  fenofibrate (TRICOR) 145 MG tablet Take 145 mg by mouth daily.   Yes Historical Provider, MD  furosemide (LASIX) 20 MG tablet Take 20 mg by mouth 2 (two) times daily.   Yes Historical Provider, MD  metoprolol succinate (TOPROL-XL) 50 MG 24 hr tablet Take 50 mg by mouth daily. Take with or immediately following a meal.   Yes Historical Provider, MD  potassium chloride (MICRO-K) 10 MEQ CR capsule Take 10 mEq by mouth 3 (three) times daily.  Yes Historical Provider, MD  simvastatin (ZOCOR) 20 MG tablet Take 20 mg by mouth at bedtime.   Yes Historical Provider, MD  spironolactone (ALDACTONE) 25 MG tablet Take 12.5 mg by mouth daily.   Yes Historical Provider, MD  traMADol (ULTRAM) 50 MG tablet Take 50 mg by mouth every 6 (six) hours as needed for moderate pain.   Yes Historical Provider, MD   Allergies  Allergen Reactions  . Other Itching and Other (See Comments)    Pt states that she is allergic to most narcotics.       FAMILY HISTORY   Family History  Problem Relation Age of Onset  . Hypertension Other       SOCIAL HISTORY    reports that she quit smoking about 2 years ago. Her smoking use included Cigarettes. She has never used smokeless tobacco. She reports  that she does not drink alcohol or use illicit drugs.  Review of Systems  Constitutional: Positive for malaise/fatigue. Negative for fever, chills and weight loss.  HENT: Negative for congestion.   Respiratory: Positive for shortness of breath. Negative for cough, hemoptysis, sputum production and wheezing.   Cardiovascular: Negative for chest pain and palpitations.  Gastrointestinal: Negative for heartburn, nausea, vomiting and abdominal pain.  Skin: Negative for rash.  Neurological: Positive for dizziness and headaches.  Psychiatric/Behavioral: The patient is nervous/anxious.   All other systems reviewed and are negative.     VITAL SIGNS    Temp:  [97.5 F (36.4 C)-98 F (36.7 C)] 98 F (36.7 C) (05/04 0500) Pulse Rate:  [57-129] 57 (05/04 0800) Resp:  [16-31] 29 (05/04 0800) BP: (61-196)/(49-163) 138/88 mmHg (05/04 0800) SpO2:  [94 %] 94 % (05/04 0800) Weight:  [98 lb (44.453 kg)-110 lb 3.7 oz (50 kg)] 110 lb 3.7 oz (50 kg) (05/04 0600) HEMODYNAMICS:   VENTILATOR SETTINGS:   INTAKE / OUTPUT:  Intake/Output Summary (Last 24 hours) at 01/11/16 [id number] Last data filed at 01/11/16 0600  Gross per 24 hour  Intake 1553.33 ml  Output    265 ml  Net 1288.33 ml       PHYSICAL EXAM   Physical Exam  Constitutional: She is oriented to person, place, and time. She appears distressed.  HENT:  Head: Normocephalic and atraumatic.  Eyes: Pupils are equal, round, and reactive to light.  Neck: Neck supple.  Pulmonary/Chest: Effort normal. No respiratory distress. She has no wheezes. She has rales.  Abdominal: Soft.  Musculoskeletal: Normal range of motion. She exhibits no edema.  Neurological: She is alert and oriented to person, place, and time.  Skin: Skin is warm. She is not diaphoretic.       LABS   LABS:  CBC  Recent Labs Lab 01/10/16 1541 01/11/16 0340  WBC 14.3* 20.0*  HGB 11.8* 10.6*  HCT 37.8 35.1  PLT 113* 89*   Coag's  Recent Labs Lab  01/10/16 1541  APTT 34  INR 2.08   BMET  Recent Labs Lab 01/10/16 1541 01/10/16 2054 01/11/16 0340  NA 131*  --  135  K 6.5*  --  5.4*  CL 102  --  107  CO2 11*  --  10*  BUN 25*  --  23*  CREATININE 1.91* 1.70* 1.51*  GLUCOSE 152*  --  91   Electrolytes  Recent Labs Lab 01/10/16 1541 01/11/16 0340  CALCIUM 8.3* 8.0*  MG 1.9  --    Sepsis Markers  Recent Labs Lab 01/10/16 2054 01/11/16 0029 01/11/16 [place]  7.1* 8.0* 9.0*   ABG  Recent Labs Lab 01/10/16 1615 01/11/16 0358  PHART 7.29* 7.19*  PCO2ART 20* 16*  PO2ART 141* 122*   Liver Enzymes  Recent Labs Lab 01/11/16 0340  AST 425*  ALT 227*  ALKPHOS 189*  BILITOT 1.6*  ALBUMIN 2.3*   Cardiac Enzymes  Recent Labs Lab 01/10/16 2054 01/11/16 0029 01/11/16 0340  TROPONINI 0.91* 1.34* 1.57*   Glucose  Recent Labs Lab 01/10/16 2004 01/10/16 2046 01/11/16 0030  GLUCAP <10* 109* 123*     Recent Results (from the past 240 hour(s))  MRSA PCR Screening     Status: None   Collection Time: 01/10/16  8:20 PM  Result Value Ref Range Status   MRSA by PCR NEGATIVE NEGATIVE Final    Comment:        The GeneXpert MRSA Assay (FDA approved for NASAL specimens only), is one component of a comprehensive MRSA colonization surveillance program. It is not intended to diagnose MRSA infection nor to guide or monitor treatment for MRSA infections.      Current facility-administered medications:  .  albuterol (PROVENTIL) (2.5 MG/3ML) 0.083% nebulizer solution 2.5 mg, 2.5 mg, Inhalation, Q6H PRN, Dustin Flock, MD .  ALPRAZolam Duanne Moron) tablet 0.25 mg, 0.25 mg, Oral, TID PRN, Dustin Flock, MD .  amitriptyline (ELAVIL) tablet 25 mg, 25 mg, Oral, QHS, Dustin Flock, MD, 25 mg at 01/10/16 2246 .  antiseptic oral rinse (CPC / CETYLPYRIDINIUM CHLORIDE 0.05%) solution 7 mL, 7 mL, Mouth Rinse, BID, Dustin Flock, MD, 7 mL at 01/10/16 2230 .  aspirin EC tablet 81 mg, 81 mg, Oral, Daily,  Dustin Flock, MD, 81 mg at 01/10/16 2116 .  clopidogrel (PLAVIX) tablet 75 mg, 75 mg, Oral, Daily, Dustin Flock, MD, 75 mg at 01/10/16 2116 .  dextrose 5 % 1,000 mL with sodium acetate 150 mEq infusion, , Intravenous, Continuous, Laverle Hobby, MD, Last Rate: 150 mL/hr at 01/11/16 0548 .  enoxaparin (LOVENOX) injection 40 mg, 40 mg, Subcutaneous, Q24H, Dustin Flock, MD, 40 mg at 01/10/16 2145 .  fenofibrate tablet 160 mg, 160 mg, Oral, Daily, Dustin Flock, MD, 160 mg at 01/10/16 2116 .  metoprolol (LOPRESSOR) injection 5 mg, 5 mg, Intravenous, Q6H, Dustin Flock, MD, 5 mg at 01/11/16 0027 .  metoprolol succinate (TOPROL-XL) 24 hr tablet 50 mg, 50 mg, Oral, Daily, Dustin Flock, MD, 50 mg at 01/10/16 2116 .  piperacillin-tazobactam (ZOSYN) IVPB 3.375 g, 3.375 g, Intravenous, Q8H, Dustin Flock, MD, 3.375 g at 01/11/16 [id number] .  simvastatin (ZOCOR) tablet 20 mg, 20 mg, Oral, QHS, Dustin Flock, MD, 20 mg at 01/10/16 2116 .  sodium chloride flush (NS) 0.9 % injection 3 mL, 3 mL, Intravenous, Q12H, Dustin Flock, MD, 3 mL at 01/10/16 2124 .  traMADol (ULTRAM) tablet 50 mg, 50 mg, Oral, Q6H PRN, Dustin Flock, MD .  vancomycin (VANCOCIN) IVPB 750 mg/150 ml premix, 750 mg, Intravenous, Q36H, Dustin Flock, MD  IMAGING    Dg Chest Port 1 View  01/10/2016  CLINICAL DATA:  Sepsis EXAM: PORTABLE CHEST 1 VIEW COMPARISON:  09/07/2015 chest radiograph. FINDINGS: Stable cardiomediastinal silhouette with mild cardiomegaly. No pneumothorax. No pleural effusion. No overt pulmonary edema. No acute consolidative airspace disease. Healed deformities in the bilateral clavicles and left lateral eighth and ninth ribs. IMPRESSION: Stable mild cardiomegaly without overt pulmonary edema. No acute pulmonary disease. Electronically Signed   By: Ilona Sorrel M.D.   On: 01/10/2016 16:31      MICRO DATA: MRSA PCR NEGATIVE Urine  Blood Resp   ANTIMICROBIALS: zosyn 5/3>>>    ASSESSMENT/PLAN  65 yo  white female with multiple medical issues with severe acidosis from orthostatic hypotension, possible sepsis but no obvious source of infection at this time  PULMONARY -Oxygen as needed -will   CARDIOVASCULAR Follow up cardiology recs  RENAL Follow chem 7, follow UO  GASTROINTESTINAL Diet as tolerated  HEMATOLOGIC Follow CBC  INFECTIOUS Will consider stopping  abx  ENDOCRINE Orthostatic  Hypotension -will consider endocrinology consult     I have personally obtained a history, examined the patient, evaluated laboratory and independently reviewed  imaging results, formulated the assessment and plan and placed orders.  The Patient requires high complexity decision making for assessment and support, frequent evaluation and titration of therapies, application of advanced monitoring technologies and extensive interpretation of multiple databases. Critical Care Time devoted to patient care services described in this note is 40 minutes.   Overall, patient is critically ill, prognosis is guarded.   Corrin Parker, M.D.  [facility]  Medical Director [place] Director [facility] Cardio-Pulmonary Department

## 2016-01-12 ENCOUNTER — Inpatient Hospital Stay: Payer: Medicare Other

## 2016-01-12 LAB — COMPREHENSIVE METABOLIC PANEL
ALBUMIN: 2.1 g/dL — AB (ref 3.5–5.0)
ALT: 150 U/L — ABNORMAL HIGH (ref 14–54)
ANION GAP: 12 (ref 5–15)
AST: 101 U/L — ABNORMAL HIGH (ref 15–41)
Alkaline Phosphatase: 155 U/L — ABNORMAL HIGH (ref 38–126)
BILIRUBIN TOTAL: 1 mg/dL (ref 0.3–1.2)
BUN: 16 mg/dL (ref 6–20)
CO2: 17 mmol/L — AB (ref 22–32)
Calcium: 7.4 mg/dL — ABNORMAL LOW (ref 8.9–10.3)
Chloride: 102 mmol/L (ref 101–111)
Creatinine, Ser: 1.09 mg/dL — ABNORMAL HIGH (ref 0.44–1.00)
GFR calc Af Amer: >60 mL/min (ref >60)
GFR calc non Af Amer: 52 mL/min — ABNORMAL LOW (ref >60)
GLUCOSE: 131 mg/dL — AB (ref 65–99)
POTASSIUM: 3.3 mmol/L — AB (ref 3.5–5.1)
SODIUM: 131 mmol/L — AB (ref 135–145)
TOTAL PROTEIN: 5 g/dL — AB (ref 6.5–8.1)

## 2016-01-12 LAB — BASIC METABOLIC PANEL
Anion gap: 12 (ref 5–15)
BUN: 17 mg/dL (ref 6–20)
CALCIUM: 7.1 mg/dL — AB (ref 8.9–10.3)
CO2: 16 mmol/L — ABNORMAL LOW (ref 22–32)
CREATININE: 1.1 mg/dL — AB (ref 0.44–1.00)
Chloride: 103 mmol/L (ref 101–111)
GFR calc non Af Amer: 52 mL/min — ABNORMAL LOW (ref >60)
GFR, EST AFRICAN AMERICAN: 60 mL/min — AB (ref >60)
Glucose, Bld: 136 mg/dL — ABNORMAL HIGH (ref 65–99)
Potassium: 3.1 mmol/L — ABNORMAL LOW (ref 3.5–5.1)
SODIUM: 131 mmol/L — AB (ref 135–145)

## 2016-01-12 LAB — LACTIC ACID, PLASMA
LACTIC ACID, VENOUS: 2.1 mmol/L — AB (ref 0.5–2.0)
LACTIC ACID, VENOUS: 4 mmol/L — AB (ref 0.5–2.0)

## 2016-01-12 LAB — CK: CK TOTAL: 881 U/L — AB (ref 38–234)

## 2016-01-12 LAB — CBC
HEMATOCRIT: 35.2 % (ref 35.0–47.0)
HEMOGLOBIN: 11.2 g/dL — AB (ref 12.0–16.0)
MCH: 27.6 pg (ref 26.0–34.0)
MCHC: 31.9 g/dL — ABNORMAL LOW (ref 32.0–36.0)
MCV: 86.8 fL (ref 80.0–100.0)
Platelets: 88 10*3/uL — ABNORMAL LOW (ref 150–440)
RBC: 4.05 MIL/uL (ref 3.80–5.20)
RDW: 26 % — ABNORMAL HIGH (ref 11.5–14.5)
WBC: 16.1 10*3/uL — AB (ref 3.6–11.0)

## 2016-01-12 LAB — SEDIMENTATION RATE: SED RATE: 27 mm/h (ref 0–30)

## 2016-01-12 LAB — ECHOCARDIOGRAM COMPLETE
HEIGHTINCHES: 60 in
WEIGHTICAEL: 1763.68 [oz_av]

## 2016-01-12 LAB — PHOSPHORUS: Phosphorus: 2.3 mg/dL — ABNORMAL LOW (ref 2.5–4.6)

## 2016-01-12 LAB — MAGNESIUM: Magnesium: 1.5 mg/dL — ABNORMAL LOW (ref 1.7–2.4)

## 2016-01-12 LAB — FIBRINOGEN: FIBRINOGEN: 371 mg/dL (ref 210–470)

## 2016-01-12 LAB — APTT: APTT: 28 s (ref 24–36)

## 2016-01-12 MED ORDER — POTASSIUM & SODIUM PHOSPHATES 280-160-250 MG PO PACK
1.0000 | PACK | Freq: Three times a day (TID) | ORAL | Status: DC
  Administered 2016-01-12 – 2016-01-14 (×9): 1 via ORAL
  Filled 2016-01-12 (×10): qty 1

## 2016-01-12 MED ORDER — OXYCODONE HCL 5 MG PO TABS
5.0000 mg | ORAL_TABLET | ORAL | Status: DC | PRN
  Administered 2016-01-12 – 2016-01-14 (×4): 5 mg via ORAL
  Filled 2016-01-12 (×4): qty 1

## 2016-01-12 MED ORDER — POTASSIUM CHLORIDE IN NACL 20-0.9 MEQ/L-% IV SOLN
INTRAVENOUS | Status: DC
  Administered 2016-01-12 – 2016-01-14 (×2): via INTRAVENOUS
  Filled 2016-01-12 (×4): qty 1000

## 2016-01-12 MED ORDER — DEXTROSE 5 % IV SOLN
10.0000 mg | Freq: Once | INTRAVENOUS | Status: AC
  Administered 2016-01-12: 10 mg via INTRAVENOUS
  Filled 2016-01-12: qty 1

## 2016-01-12 MED ORDER — POTASSIUM CHLORIDE CRYS ER 20 MEQ PO TBCR
20.0000 meq | EXTENDED_RELEASE_TABLET | ORAL | Status: AC
  Administered 2016-01-12 (×2): 20 meq via ORAL
  Filled 2016-01-12 (×2): qty 1

## 2016-01-12 MED ORDER — MAGNESIUM SULFATE 4 GM/100ML IV SOLN
4.0000 g | Freq: Once | INTRAVENOUS | Status: AC
  Administered 2016-01-12: 4 g via INTRAVENOUS
  Filled 2016-01-12: qty 100

## 2016-01-12 NOTE — Progress Notes (Signed)
RN made Dr. Ether Griffins aware that Patient complaining of severe pain to hands and feet and states that the tramadol is not working and that she wants something stronger. Dr. Ether Griffins gave order for oxycodone 5mg  q4H PRN severe pain.

## 2016-01-12 NOTE — Progress Notes (Signed)
SUBJECTIVE: Patient is having tingling sensation in her fingers but denies any chest pain or shortness of breath and appears to be more alert.   Filed Vitals:   01/12/16 0500 01/12/16 0600 01/12/16 0700 01/12/16 0800  BP: 109/74 110/66 95/66 106/61  Pulse:      Temp:    97.5 F (36.4 C)  TempSrc:    Oral  Resp: 23 20 15 14   Height:      Weight:      SpO2:        Intake/Output Summary (Last 24 hours) at 01/12/16 0853 Last data filed at 01/12/16 0400  Gross per 24 hour  Intake   1040 ml  Output    305 ml  Net    735 ml    LABS: Basic Metabolic Panel:  Recent Labs  01/10/16 1541  01/11/16 0340 01/11/16 1608 01/11/16 2023  NA 131*  --  135  --  136  K 6.5*  --  5.4* 3.0* 3.6  CL 102  --  107  --  105  CO2 11*  --  10*  --  17*  GLUCOSE 152*  --  91  --  132*  BUN 25*  --  23*  --  21*  CREATININE 1.91*  < > 1.51*  --  1.41*  CALCIUM 8.3*  --  8.0*  --  7.2*  MG 1.9  --   --   --   --   < > = values in this interval not displayed. Liver Function Tests:  Recent Labs  01/11/16 0340 01/11/16 2023  AST 425* 261*  ALT 227* 213*  ALKPHOS 189* 149*  BILITOT 1.6* 1.3*  PROT 5.1* 4.9*  ALBUMIN 2.3* 2.1*    Recent Labs  01/10/16 1541  LIPASE 27   CBC:  Recent Labs  01/11/16 0340 01/11/16 1608 01/12/16 0436  WBC 20.0*  --  16.1*  NEUTROABS  --  17.7*  --   HGB 10.6*  --  11.2*  HCT 35.1  --  35.2  MCV 90.4  --  86.8  PLT 89*  --  88*   Cardiac Enzymes:  Recent Labs  01/10/16 2054 01/11/16 0029 01/11/16 0340 01/11/16 1608 01/12/16 0436  CKTOTAL  --   --   --  1031* 881*  TROPONINI 0.91* 1.34* 1.57*  --   --    BNP: Invalid input(s): POCBNP D-Dimer: No results for input(s): DDIMER in the last 72 hours. Hemoglobin A1C: No results for input(s): HGBA1C in the last 72 hours. Fasting Lipid Panel: No results for input(s): CHOL, HDL, LDLCALC, TRIG, CHOLHDL, LDLDIRECT in the last 72 hours. Thyroid Function Tests:  Recent Labs  01/10/16 2054   TSH 2.164   Anemia Panel: No results for input(s): VITAMINB12, FOLATE, FERRITIN, TIBC, IRON, RETICCTPCT in the last 72 hours.   PHYSICAL EXAM General: Well developed, well nourished, in no acute distress HEENT:  Normocephalic and atramatic Neck:  No JVD.  Lungs: Clear bilaterally to auscultation and percussion. Heart: HRRR . Normal S1 and S2 without gallops or murmurs.  Abdomen: Bowel sounds are positive, abdomen soft and non-tender  Msk:  Back normal, normal gait. Normal strength and tone for age. Extremities: No clubbing, cyanosis or edema.   Neuro: Alert and oriented X 3. Psych:  Good affect, responds appropriately  TELEMETRY:Sinus rhythm  ASSESSMENT AND PLAN: Lactic acidosis with possible sepsis/scleroderma as the cause of her symptoms. Patient has mildly elevated troponin but the has no chest pain thus  will treat the patient conservatively.  Active Problems:   Sepsis ([place])    Cheryl David, MD, [facility] 01/12/2016 8:53 AM

## 2016-01-12 NOTE — Progress Notes (Signed)
PT Cancellation Note  Patient Details Name: Cheryl Hamilton MRN: [id number] DOB: August 28, 1951   Cancelled Treatment:    Reason Eval/Treat Not Completed: Other (comment)  Attempted to see pt, but interrupted for blood draw for morning labs.  Will re-attempt as time allows.  Alvena Kiernan A Tammye Kahler, PT 01/12/2016, 11:05 AM

## 2016-01-12 NOTE — Consult Note (Signed)
Reason for Consult: Scleroderma  Referring Physician: Dr. Clayton Bibles. Hospitalist  Pota Hanis Nemetz   HPI: Complicated story. 65 year old white female. Develop Raynaud's 2008. Thought to have scleroderma. Positive SCL70 antibodies. Positive ANA. Had arthralgias and Raynaud's. Occasionally had sores on the tips. Has not had renal crisis or acute renal involvement. She's not had interstitial lung disease. She's not been on immunosuppressive drugs. Plaquenil and minocycline for arthralgias. Amlodipine for Raynaud's. Vulvar cancer in 2010. Chemotherapy. Surgery. Occurrence and 2016. Surgery. Was to have more extensive treatment. Recent angiogram showed renal artery stenosis Recent shortness of breath. Echocardiogram in October ejection fraction 60% in December 45% and now 15%. Elevated RVSP. She's not been on medicine for pulmonary hypertension Recent falls. Cervical fracture. Recent shortness of breath. Admitted with acidosis. Has developed purple fingers and splotchy purple toes.'s had other ecchymoses and face and hands. Other labs pertinent for abnormal liver functions with elevated transaminases. Albumin 2.1. Elevated pro time at 22. Thrombocytopenia. Hypokalemic. Elevated lactic acid. CPK 10,000 now 800 PCO2 25. Blood pressure is been running 90-100. She's not been tach, tachycardic. O2 sat is reasonable on nasal oxygen. Chest x-ray does not show significant interstitial lung disease or edema. Complains of hands and feet hurting  PMH: Vaginal cancer. Scleroderma. Congestive heart.. Cervical compression fracture. Anemia. Renal insufficiency .recent angiogram showed renal artery stenosis  SURGICAL HISTORY: Vaginal surgery lumbar spine surgery  Family History: Negative for connective tissue disease  Social History: Still smokes occasionally  Allergies:  Allergies  Allergen Reactions  . Other Itching and Other (See Comments)    Pt states that she is allergic to most narcotics.      Medications:   Scheduled: . amitriptyline  25 mg Oral QHS  . antiseptic oral rinse  7 mL Mouth Rinse BID  . aspirin EC  81 mg Oral Daily  . clopidogrel  75 mg Oral Daily  . fenofibrate  160 mg Oral Daily  . magnesium sulfate 1 - 4 g bolus IVPB  4 g Intravenous Once  . metoprolol succinate  50 mg Oral Daily  . potassium & sodium phosphates  1 packet Oral TID WC & HS  . potassium chloride  20 mEq Oral Q2H  . sodium chloride flush  3 mL Intravenous Q12H        ROS:No chest pain. No abdominal pain. No difficulty swallowing. No recent joint swelling.   PHYSICAL EXAM: Blood pressure 114/74, pulse 29, temperature 97.6 F (36.4 C), temperature source Axillary, resp. rate 15, height 5' (1.524 m), weight 50 kg (110 lb 3.7 oz), SpO2 98 %. Pleasant female. Does not completely answer questions. Focuses on some history of cold detail of her prior illnesses. oxygen in place. Ecchymoses over her for it. Also some ecchymoses are in the pulp of her fingers. She has mild diffuse sclerodactyly,. Generalized cyanosis.no significant nail fold telangiectasias. Evidence of old pitting but no new lesions. The ball of the feet are mottled with ecchymoses, painful and nonblanching. Not  purpuric. Appears To be nonblanching ecchymoses under the skin. The feet are cool. Cannot palpate posterior tibial or dorsalis pedis pulses. Can palpate radial pulses bilaterally in the hands. No significant palpable femoral pulses. No definite femoral or abdominal bruit. Bilateral rales. Neck veins are not particularly distended. No murmur. No loud P2. Cannot palpate hepatomegaly. 1+ edema No significant synovitis in shoulders elbows hands or knees She can raise both legs against gravity. She can grip in both hands.  Assessment: History of stable scleroderma(no history of interstitial lung  disease or scleroderma renal crises or immunosuppression, rather stable Raynaud's) with recent recurrence of  Malignancy, and recent falls now with acute and  subacute onset of upper and lower extremity ischemia with ecchymoses, lactic acidosis, significant new cardiac dysfunction, abnormal liver functions with elevated pro time and decreased albumin, elevated CPK. Rule out coagulopathy versus vasculitis versus scleroderma-related  Recommendations: DIC panel, anti-cardiolipin antibodies, lupus anticoagulant, APTT. Consider heme opinion If no evidence of coagulopathy and concern about vasculitis, may need [facility] opinion  Emmaline Kluver 01/12/2016, 5:32 PM

## 2016-01-12 NOTE — Progress Notes (Signed)
Physical Therapy Evaluation Patient Details Name: Cheryl Hamilton MRN: [id number] DOB: [birth year] Today's Date: 01/12/2016   History of Present Illness  Cheryl Hamilton  is a 65 y.o. female with a known history of  Multiple medical problems including CHF, hypertension, coronary artery disease, chronic renal failure and scleroderma resulting in him loose skin. Presents with generalized weakness and fatigueness. Patient in the ED is noted to have acute renal failure hyperkalemia. Sinus tachycardia. Lactic acid level is greater than 6. Patient also had troponin elevation and some ST changes.   Clinical Impression  Pt presents to PT with generalized weakness and decreased tolerance to functional mobility due to pain in hands and feet.  Pt required Mod A for bed mobility and Min A for transfers to [facility] and chair.  Pt lives with her son and has 5 steps to enter her home.  Pt would benefit from acute PT services to address objective findings.    Follow Up Recommendations SNF    Equipment Recommendations  None recommended by PT    Recommendations for Other Services       Precautions / Restrictions Precautions Precautions: Fall Precaution Comments: Mod Restrictions Weight Bearing Restrictions: No      Mobility  Bed Mobility Overal bed mobility: Needs Assistance Bed Mobility: Supine to Sit     Supine to sit: Mod assist     General bed mobility comments: Pt able to initiate getting up out of bed but needs assist with elevating trunk and rotating hips  Transfers Overall transfer level: Needs assistance   Transfers: Sit to/from Stand;Stand Pivot Transfers Sit to Stand: Min assist Stand pivot transfers: Min assist       General transfer comment: verbal cues to push from seated surface, slow to rise with good body mechanics  Ambulation/Gait Ambulation/Gait assistance: Min assist Ambulation Distance (Feet): 2 Feet Assistive device: Rolling walker (2 wheeled)       General Gait  Details: Small shuffled steps, foot flat, increased pain and poor balance.  Stairs            Wheelchair Mobility    Modified Rankin (Stroke Patients Only)       Balance Overall balance assessment: Needs assistance Sitting-balance support: Bilateral upper extremity supported;Feet supported Sitting balance-Leahy Scale: Good     Standing balance support: Bilateral upper extremity supported;During functional activity Standing balance-Leahy Scale: Fair Standing balance comment: overall poor postural control due to weakness                             Pertinent Vitals/Pain Pain Assessment: 0-10 Pain Location: bottom of feet and fingers Pain Intervention(s): Monitored during session    Home Living Family/patient expects to be discharged to:: Private residence Living Arrangements: Children Available Help at Discharge: Family;Available PRN/intermittently Type of Home: House Home Access: Stairs to enter Entrance Stairs-Rails: Right Entrance Stairs-Number of Steps: 6-7 Home Layout: One level Home Equipment: Cane - single point;Grab bars - tub/shower;Walker - 2 wheels      Prior Function Level of Independence: Independent with assistive device(s)         Comments: Limited mobility with cane, reports being able to carry one light bag of groceries up steps.     Hand Dominance        Extremity/Trunk Assessment   Upper Extremity Assessment: Generalized weakness           Lower Extremity Assessment: Generalized weakness  Communication   Communication: No difficulties (likes to talk and ramble on)  Cognition Arousal/Alertness: Awake/alert Behavior During Therapy: WFL for tasks assessed/performed Overall Cognitive Status: Within Functional Limits for tasks assessed                      General Comments General comments (skin integrity, edema, etc.): gray skin on face and arms, bottom of feet and fingers blue    Exercises Other  Exercises Other Exercises: Extensive amount of time to transfer from bed to [facility] and to complete toileting hygeine.  Pt required Min A for balance during transfer and total assist for hygeine using RW for support.      Assessment/Plan    PT Assessment Patient needs continued PT services  PT Diagnosis Difficulty walking;Generalized weakness;Acute pain   PT Problem List Decreased strength;Decreased activity tolerance;Decreased balance;Decreased mobility;Decreased knowledge of use of DME;Impaired sensation;Pain  PT Treatment Interventions DME instruction;Gait training;Stair training;Functional mobility training;Therapeutic activities;Therapeutic exercise;Balance training;Patient/family education   PT Goals (Current goals can be found in the Care Plan section) Acute Rehab PT Goals Patient Stated Goal: To go home. PT Goal Formulation: With patient Time For Goal Achievement: 01/26/16    Frequency Min 2X/week   Barriers to discharge Decreased caregiver support      Co-evaluation               End of Session Equipment Utilized During Treatment: Gait belt Activity Tolerance: Patient tolerated treatment well Patient left: in chair;with call bell/phone within reach Nurse Communication: Mobility status         Time: [id number] PT Time Calculation (min) (ACUTE ONLY): 30 min   Charges:   PT Evaluation $PT Eval Moderate Complexity: 1 Procedure PT Treatments $Therapeutic Activity: 8-22 mins   PT G Codes:        Sheneka Schrom A Lorretta Kerce, PT 01/12/2016, 12:51 PM

## 2016-01-12 NOTE — Progress Notes (Signed)
[place] [facility] [place] NAME: Cheryl Hamilton    MR#:  [id number]  DATE OF BIRTH:  [birth year]  SUBJECTIVE:  CHIEF COMPLAINT:   Chief Complaint  Patient presents with  . Fatigue  Patient is 65 year old Caucasian female with past medical history significant for vulvar cancer, scleroderma, who presents to the hospital with complaints of dizziness, especially whenever she stands up, shortness of breath. On arrival to the hospital patient was noted to be tachycardic, acidotic. Labs revealed chronic renal insufficiency, although worsened in the past one month, hyperkalemia, elevated transaminases. Lactic acid level of 9.0. At its peak, elevated troponin, leukocytosis, thrombocytopenia. Due to concerns of infection/sepsis. Patient was initiated on broad-spectrum antibiotic therapy, blood cultures were negative, MRSA screen was negative, urine culture was negative. Patient is off antibiotics now. Lactic acid level improved with hydration, oxygenation, patient's oxygen saturations remained stable. No obvious infection noted, no fever. Patient complains of pain in the fingertips  . Review of Systems  Constitutional: Positive for malaise/fatigue. Negative for fever, chills and weight loss.  HENT: Negative for congestion.   Eyes: Negative for blurred vision and double vision.  Respiratory: Positive for shortness of breath. Negative for cough, sputum production and wheezing.   Cardiovascular: Negative for chest pain, palpitations, orthopnea, leg swelling and PND.  Gastrointestinal: Negative for nausea, vomiting, abdominal pain, diarrhea, constipation and blood in stool.  Genitourinary: Negative for dysuria, urgency, frequency and hematuria.  Musculoskeletal: Positive for joint pain. Negative for falls.  Neurological: Positive for dizziness. Negative for tremors, focal weakness and headaches.  Endo/Heme/Allergies: Does not bruise/bleed easily.   Psychiatric/Behavioral: Negative for depression. The patient does not have insomnia.     VITAL SIGNS: Blood pressure 97/43, pulse 29, temperature 97.6 F (36.4 C), temperature source Axillary, resp. rate 20, height 5' (1.524 m), weight 50 kg (110 lb 3.7 oz), SpO2 93 %.  PHYSICAL EXAMINATION:   GENERAL:  65 y.o.-year-old patient lying in the no significant distress, remains pale, gray discoloration of face,. Cyanotic EYES: Pupils equal, round, reactive to light and accommodation. No scleral icterus. Extraocular muscles intact.  HEENT: Head atraumatic, normocephalic. Oropharynx and nasopharynx clear.  NECK:  Supple, no jugular venous distention. No thyroid enlargement, no tenderness.  LUNGS: Normal breath sounds bilaterally, no wheezing, rales,rhonchi or crepitations, not using respiratory muscles, unless with longer sentences  CARDIOVASCULAR: S1, S2 , tachycardic.  No murmurs, rubs, or gallops.  ABDOMEN: Soft, nontender, nondistended. Bowel sounds present. No organomegaly or mass.  EXTREMITIES: No pedal edema, bilateral upper extremity and lower extremity cyanosis, and clubbing.  NEUROLOGIC: Cranial nerves II through XII are intact. Muscle strength 5/5 in all extremities. Sensation intact. Gait not checked.  PSYCHIATRIC: The patient is alert , oriented 3   SKIN: No obvious rash, lesion, or ulcer. Ulcerations in fingertips, blue discoloration, but skin is warm    ORDERS/RESULTS REVIEWED:   CBC  Recent Labs Lab 01/10/16 1541 01/11/16 0340 01/11/16 1608 01/12/16 0436  WBC 14.3* 20.0*  --  16.1*  HGB 11.8* 10.6*  --  11.2*  HCT 37.8 35.1  --  35.2  PLT 113* 89*  --  88*  MCV 87.7 90.4  --  86.8  MCH 27.4 27.3  --  27.6  MCHC 31.2* 30.2*  --  31.9*  RDW 26.6* 26.1*  --  26.0*  LYMPHSABS  --   --  1.6  --   MONOABS  --   --  0.2  --   EOSABS  --   --  0.0  --   BASOSABS  --   --  0.0  --     ------------------------------------------------------------------------------------------------------------------  Chemistries   Recent Labs Lab 01/10/16 1541 01/10/16 2054 01/11/16 0340 01/11/16 1608 01/11/16 2023 01/12/16 0436 01/12/16 1045  NA 131*  --  135  --  136  --  131*  K 6.5*  --  5.4* 3.0* 3.6  --  3.1*  CL 102  --  107  --  105  --  103  CO2 11*  --  10*  --  17*  --  16*  GLUCOSE 152*  --  91  --  132*  --  136*  BUN 25*  --  23*  --  21*  --  17  CREATININE 1.91* 1.70* 1.51*  --  1.41*  --  1.10*  CALCIUM 8.3*  --  8.0*  --  7.2*  --  7.1*  MG 1.9  --   --   --   --  1.5*  --   AST  --   --  425*  --  261*  --   --   ALT  --   --  227*  --  213*  --   --   ALKPHOS  --   --  189*  --  149*  --   --   BILITOT  --   --  1.6*  --  1.3*  --   --    ------------------------------------------------------------------------------------------------------------------ estimated creatinine clearance is 36.6 mL/min (by C-G formula based on Cr of 1.1). ------------------------------------------------------------------------------------------------------------------  Recent Labs  01/10/16 2054  TSH 2.164    Cardiac Enzymes  Recent Labs Lab 01/10/16 2054 01/11/16 0029 01/11/16 0340  TROPONINI 0.91* 1.34* 1.57*   ------------------------------------------------------------------------------------------------------------------ Invalid input(s): POCBNP ---------------------------------------------------------------------------------------------------------------  RADIOLOGY: Nm Pulmonary Perf And Vent  01/11/2016  CLINICAL DATA:  Shortness of Breath EXAM: NUCLEAR MEDICINE VENTILATION - PERFUSION LUNG SCAN Views: Anterior, posterior, left lateral, right lateral, RPO, LPO, RAO, LAO -ventilation and perfusion RADIOPHARMACEUTICALS:  35.161 mCi Technetium-64m DTPA aerosol inhalation and 4.269 mCi Technetium-49m MAA IV COMPARISON:  Chest radiograph Jan 10, 2016 FINDINGS:  Ventilation: There are a few scattered subsegmental foci of decreased attenuation. There is no segmental ventilation defect on either side. Perfusion: There are a few matching subsegmental lower lobe perfusion defects. There is no appreciable ventilation/perfusion mismatch, and there are no perfusion defects at a segmental or nearly segmental level. Heart is noted to be prominent. IMPRESSION: No appreciable ventilation/ perfusion mismatch. Small subsegmental matching ventilation and perfusion defects. These findings constitute a low probability of pulmonary embolus. Electronically Signed   By: Lowella Grip III M.D.   On: 01/11/2016 14:22   Dg Chest Port 1 View  01/10/2016  CLINICAL DATA:  Sepsis EXAM: PORTABLE CHEST 1 VIEW COMPARISON:  09/07/2015 chest radiograph. FINDINGS: Stable cardiomediastinal silhouette with mild cardiomegaly. No pneumothorax. No pleural effusion. No overt pulmonary edema. No acute consolidative airspace disease. Healed deformities in the bilateral clavicles and left lateral eighth and ninth ribs. IMPRESSION: Stable mild cardiomegaly without overt pulmonary edema. No acute pulmonary disease. Electronically Signed   By: Ilona Sorrel M.D.   On: 01/10/2016 16:31   US Abdomen Limited Ruq  01/12/2016  CLINICAL DATA:  Elevated transaminase levels. EXAM: US ABDOMEN LIMITED - RIGHT UPPER QUADRANT COMPARISON:  Abdominal and pelvic CT scan of March 23 2012. FINDINGS: Bowel gas limits evaluation of some of the liver images. Gallbladder: The gallbladder is adequately distended. There is a small amount  of echogenic sludge or bile present. No stones are evident. There is no gallbladder wall thickening, pericholecystic fluid, or positive sonographic Murphy's sign. Common bile duct: Diameter: 2.9 mm Liver: The hepatic echotexture is normal. There is no intrahepatic ductal dilation. The surface contour of the liver is normal. However, there is small amount of ascites surrounding the liver. Incidental  note is made of a small right kidney measuring 8.9 cm in length. IMPRESSION: 1. Small amount of sludge within the gallbladder but no discrete stones are observed. Further evaluation with a nuclear medicine hepatobiliary scan may be useful if gallbladder dysfunction is suspected clinically. 2. No ultrasonic abnormality of the visualized portions of the liver or common bile duct. However, there is a small amount of ascites surrounding the liver. 3. Small right kidney without evidence of obstruction. Electronically Signed   By: David  Martinique M.D.   On: 01/12/2016 09:24    EKG:  Orders placed or performed during the hospital encounter of 01/10/16  . EKG 12-Lead  . EKG 12-Lead  . ED EKG  . ED EKG  . EKG 12-Lead  . EKG 12-Lead    ASSESSMENT AND PLAN:  Active Problems:   Sepsis ([place]) #1 Acute respiratory failure with hypoxia , Questionable due to shunting, no obvious pulmonary embolus on VQ scan, continue oxygen therapy  despite good oxygenation, patient may benefit from right heart catheterization , although her cardiologist recommends conservative therapy only  #2. Dizziness, suspect due to pulmonary hypertension, continue oxygen therapy  #3 Acidosis,  unclear etiology, improved significantly, suspected shunting, ? intracardiac , , but low risk for pulmonary embolism on VQ scan  continue supportive therapy. Echocardiogram was performed during this admission and showed no intracardiac shunts. Cardiologist recommends conservative therapy, likely due to CKD #4. Acute on chronic renal insufficiency, CKD stage 3, , improved with IV fluid administration #5. Hyperkalemia, resolved with blood alkalinization with citrate, now off citrate , receiving IV fluids with potassium supplementation, follow in the morning  #6. Elevated transaminases, due to rhabdomyolysis, which is improving with alkalinization, IV fluids #7. Marland Kitchen Leukocytosis, improved with therapy, now off antibiotics #8. Thrombocytopenia, stable,  likely due to underlying rheumatologic disease, differential was performed and no schistocytes were found  . #9. Scleroderma with Raynaud syndrome, rheumatologist consultation is requested    Management plans discussed with the patient, family and they are in agreement.   DRUG ALLERGIES:  Allergies  Allergen Reactions  . Other Itching and Other (See Comments)    Pt states that she is allergic to most narcotics.      CODE STATUS:     Code Status Orders        Start     Ordered   01/10/16 1829  Full code   Continuous     01/10/16 1829    Code Status History    Date Active Date Inactive Code Status Order ID Comments User Context   09/07/2015  9:36 AM 09/10/2015  6:23 PM Full Code [id number]  Hillary Bow, MD ED   06/12/2015  3:37 AM 06/15/2015  3:42 PM Full Code [id number]  Rise Patience, MD Inpatient   06/12/2015 12:19 AM 06/12/2015  3:37 AM Full Code [id number]  Rise Patience, MD Inpatient    Advance Directive Documentation        Most Recent Value   Type of Advance Directive  Healthcare Power of Attorney, Living will   Pre-existing out of facility DNR order (yellow form or pink MOST form)     "  MOST" Form in Place?        TOTAL Critical care TIME TAKING CARE OF THIS PATIENT:35 minutes.    Theodoro Grist M.D on 01/12/2016 at 3:38 PM  Between 7am to 6pm - Pager - [phone]  After 6pm go to [email or web address] - password [email or web address]  [place] [facility]  Office  [phone]  CC: Primary care physician; [facility], Chrissie Noa, MD

## 2016-01-12 NOTE — Progress Notes (Signed)
Notified Dr. Ether Griffins about Mag 1.5 and phosp. 2.3- She stated she would place orders to replace both electrolytes.

## 2016-01-12 NOTE — Progress Notes (Signed)
Notified Dr. Claudette Laws about results of sodium 131, potassium 3.1, lactic acid 2.1 and results of ultrasound of abdomen.  She stated she would order NS with 20KCL at 76ml/hr

## 2016-01-12 NOTE — Progress Notes (Addendum)
PARENTERAL NUTRITION CONSULT NOTE - INITIAL  Pharmacy Consult for Electrolyte Monitoring Indication: Hypokalemia  Allergies  Allergen Reactions  . Other Itching and Other (See Comments)    Pt states that she is allergic to most narcotics.      Patient Measurements: Height: 5' (152.4 cm) Weight: 110 lb 3.7 oz (50 kg) IBW/kg (Calculated) : 45.5  Vital Signs: Temp: 97.6 F (36.4 C) (05/05 1426) Temp Source: Axillary (05/05 1426) BP: 97/43 mmHg (05/05 1300) Pulse Rate: 29 (05/05 1300) Intake/Output from previous day: 05/04 0701 - 05/05 0700 In: 1310 [P.O.:360; I.V.:450; IV Piggyback:500] Out: 340 [Urine:340] Intake/Output from this shift: Total I/O In: -  Out: 250 [Urine:250]  Labs:  Recent Labs  01/10/16 1541 01/11/16 0340 01/11/16 1608 01/12/16 0436  WBC 14.3* 20.0*  --  16.1*  HGB 11.8* 10.6*  --  11.2*  HCT 37.8 35.1  --  35.2  PLT 113* 89*  --  88*  APTT 34  --   --   --   INR 2.08  --  2.00  --      Recent Labs  01/10/16 1541  01/11/16 0340 01/11/16 1608 01/11/16 2023 01/12/16 0436 01/12/16 1045  NA 131*  --  135  --  136  --  131*  K 6.5*  --  5.4* 3.0* 3.6  --  3.1*  CL 102  --  107  --  105  --  103  CO2 11*  --  10*  --  17*  --  16*  GLUCOSE 152*  --  91  --  132*  --  136*  BUN 25*  --  23*  --  21*  --  17  CREATININE 1.91*  < > 1.51*  --  1.41*  --  1.10*  CALCIUM 8.3*  --  8.0*  --  7.2*  --  7.1*  MG 1.9  --   --   --   --  1.5*  --   PHOS  --   --   --   --   --  2.3*  --   PROT  --   --  5.1*  --  4.9*  --   --   ALBUMIN  --   --  2.3*  --  2.1*  --   --   AST  --   --  425*  --  261*  --   --   ALT  --   --  227*  --  213*  --   --   ALKPHOS  --   --  189*  --  149*  --   --   BILITOT  --   --  1.6*  --  1.3*  --   --   BILIDIR  --   --  0.8*  --   --   --   --   IBILI  --   --  0.8  --   --   --   --   < > = values in this interval not displayed. Estimated Creatinine Clearance: 36.6 mL/min (by C-G formula based on Cr of 1.1).     Recent Labs  01/10/16 2004 01/10/16 2046 01/11/16 0030  GLUCAP <10* 109* 123*    Medical History: Past Medical History  Diagnosis Date  . CHF (congestive heart failure) ([place])   . Hypertension   . Coronary artery disease   . Renal insufficiency   . Scleroderma ([place])   .  Chronic back pain   . GERD (gastroesophageal reflux disease)   . Blue skin   . COPD (chronic obstructive pulmonary disease) ([place])   . Raynaud disease   . Shortness of breath dyspnea   . Anginal pain ([place])   . Edema   . Hyperlipemia   . Ovarian cancer ([place])     chemo/rad  . 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 Vulvar cancer ([place])     Medications:  Scheduled:  . amitriptyline  25 mg Oral QHS  . antiseptic oral rinse  7 mL Mouth Rinse BID  . aspirin EC  81 mg Oral Daily  . clopidogrel  75 mg Oral Daily  . fenofibrate  160 mg Oral Daily  . metoprolol succinate  50 mg Oral Daily  . potassium chloride  20 mEq Oral Q2H  . sodium chloride flush  3 mL Intravenous Q12H   Infusions:  . 0.9 % NaCl with KCl 20 mEq / L 40 mL/hr at 01/12/16 1429    Assessment: Pharmacy consulted to assist in managing electrolytes in this 65 y/o F with hypokalemia.   Plan:  Magnesium 4 g iv once. Patient on phos-nak tabs. Will give potassium chloride 20 meq po x 2 and f/u am labs.     Ulice Dash D 01/12/2016,2:53 PM

## 2016-01-12 NOTE — Consult Note (Signed)
[facility] Consultation      Name: Lorriann Gaschler MRN: [id number] DOB: 14-Feb-1951    ADMISSION DATE:  01/10/2016    CHIEF COMPLAINT:   Dizziness and SOB-resolving   HISTORY OF PRESENT ILLNESS  Patient alert and awake, follows commands Feels better this AM, wants to eat breakfast L  Review of Systems  Constitutional: Negative for fever, chills, weight loss and malaise/fatigue.  HENT: Negative for congestion.   Respiratory: Negative for cough, hemoptysis, sputum production, shortness of breath and wheezing.   Cardiovascular: Negative for chest pain and palpitations.  Gastrointestinal: Negative for heartburn, nausea, vomiting and abdominal pain.  Skin: Negative for rash.  Neurological: Negative for dizziness and headaches.  Psychiatric/Behavioral: The patient is not nervous/anxious.   All other systems reviewed and are negative.     VITAL SIGNS    Temp:  [96.8 F (36 C)-97.9 F (36.6 C)] 97.5 F (36.4 C) (05/05 0800) Resp:  [14-39] 14 (05/05 0800) BP: (95-147)/(57-116) 106/61 mmHg (05/05 0800) HEMODYNAMICS:   VENTILATOR SETTINGS:   INTAKE / OUTPUT:  Intake/Output Summary (Last 24 hours) at 01/12/16 0807 Last data filed at 01/12/16 0400  Gross per 24 hour  Intake   1040 ml  Output    305 ml  Net    735 ml       PHYSICAL EXAM   Physical Exam  Constitutional: She is oriented to person, place, and time. No distress.  HENT:  Head: Normocephalic and atraumatic.  Eyes: Pupils are equal, round, and reactive to light.  Neck: Neck supple.  Pulmonary/Chest: Effort normal. No respiratory distress. She has no wheezes. She has rales.  Abdominal: Soft.  Musculoskeletal: Normal range of motion. She exhibits no edema.  Neurological: She is alert and oriented to person, place, and time.  Skin: Skin is warm. She is not diaphoretic.       LABS   LABS:  CBC  Recent Labs Lab 01/10/16 1541 01/11/16 0340 01/12/16 0436  WBC 14.3*  20.0* 16.1*  HGB 11.8* 10.6* 11.2*  HCT 37.8 35.1 35.2  PLT 113* 89* 88*   Coag's  Recent Labs Lab 01/10/16 1541 01/11/16 1608  APTT 34  --   INR 2.08 2.00   BMET  Recent Labs Lab 01/10/16 1541 01/10/16 2054 01/11/16 0340 01/11/16 1608 01/11/16 2023  NA 131*  --  135  --  136  K 6.5*  --  5.4* 3.0* 3.6  CL 102  --  107  --  105  CO2 11*  --  10*  --  17*  BUN 25*  --  23*  --  21*  CREATININE 1.91* 1.70* 1.51*  --  1.41*  GLUCOSE 152*  --  91  --  132*   Electrolytes  Recent Labs Lab 01/10/16 1541 01/11/16 0340 01/11/16 2023  CALCIUM 8.3* 8.0* 7.2*  MG 1.9  --   --    Sepsis Markers  Recent Labs Lab 01/10/16 2054 01/11/16 0029 01/11/16 0340  LATICACIDVEN 7.1* 8.0* 9.0*   ABG  Recent Labs Lab 01/10/16 1615 01/11/16 0358 01/11/16 1100  PHART 7.29* 7.19* 7.36  PCO2ART 20* 16* 25*  PO2ART 141* 122* 102   Liver Enzymes  Recent Labs Lab 01/11/16 0340 01/11/16 2023  AST 425* 261*  ALT 227* 213*  ALKPHOS 189* 149*  BILITOT 1.6* 1.3*  ALBUMIN 2.3* 2.1*   Cardiac Enzymes  Recent Labs Lab 01/10/16 2054 01/11/16 0029 01/11/16 0340  TROPONINI 0.91* 1.34* 1.57*   Glucose  Recent  Labs Lab 01/10/16 2004 01/10/16 2046 01/11/16 0030  GLUCAP <10* 109* 123*     Recent Results (from the past 240 hour(s))  Blood Culture (routine x 2)     Status: None (Preliminary result)   Collection Time: 01/10/16  4:16 PM  Result Value Ref Range Status   Specimen Description BLOOD LEFT ASSIST CONTROL  Final   Special Requests BOTTLES DRAWN AEROBIC AND ANAEROBIC [place]  Final   Culture NO GROWTH < 24 HOURS  Final   Report Status PENDING  Incomplete  Blood Culture (routine x 2)     Status: None (Preliminary result)   Collection Time: 01/10/16  4:16 PM  Result Value Ref Range Status   Specimen Description BLOOD RIGHT ASSIST CONTROL  Final   Special Requests BOTTLES DRAWN AEROBIC AND ANAEROBIC [place]  Final   Culture NO GROWTH < 24  HOURS  Final   Report Status PENDING  Incomplete  MRSA PCR Screening     Status: None   Collection Time: 01/10/16  8:20 PM  Result Value Ref Range Status   MRSA by PCR NEGATIVE NEGATIVE Final    Comment:        The GeneXpert MRSA Assay (FDA approved for NASAL specimens only), is one component of a comprehensive MRSA colonization surveillance program. It is not intended to diagnose MRSA infection nor to guide or monitor treatment for MRSA infections.      Current facility-administered medications:  .  albuterol (PROVENTIL) (2.5 MG/3ML) 0.083% nebulizer solution 2.5 mg, 2.5 mg, Inhalation, Q6H PRN, Dustin Flock, MD .  ALPRAZolam Duanne Moron) tablet 0.25 mg, 0.25 mg, Oral, TID PRN, Dustin Flock, MD .  amitriptyline (ELAVIL) tablet 25 mg, 25 mg, Oral, QHS, Dustin Flock, MD, 25 mg at 01/11/16 2116 .  antiseptic oral rinse (CPC / CETYLPYRIDINIUM CHLORIDE 0.05%) solution 7 mL, 7 mL, Mouth Rinse, BID, Dustin Flock, MD, 7 mL at 01/11/16 2116 .  aspirin EC tablet 81 mg, 81 mg, Oral, Daily, Dustin Flock, MD, 81 mg at 01/11/16 1152 .  clopidogrel (PLAVIX) tablet 75 mg, 75 mg, Oral, Daily, Dustin Flock, MD, 75 mg at 01/11/16 1152 .  enoxaparin (LOVENOX) injection 30 mg, 30 mg, Subcutaneous, Q24H, Theodoro Grist, MD, 30 mg at 01/11/16 1701 .  fenofibrate tablet 160 mg, 160 mg, Oral, Daily, Dustin Flock, MD, 160 mg at 01/11/16 1152 .  metoprolol (LOPRESSOR) injection 5 mg, 5 mg, Intravenous, Q6H, Dustin Flock, MD, 5 mg at 01/12/16 0027 .  metoprolol succinate (TOPROL-XL) 24 hr tablet 50 mg, 50 mg, Oral, Daily, Dustin Flock, MD, 50 mg at 01/11/16 1151 .  simvastatin (ZOCOR) tablet 20 mg, 20 mg, Oral, QHS, Dustin Flock, MD, 20 mg at 01/11/16 2116 .  sodium chloride flush (NS) 0.9 % injection 3 mL, 3 mL, Intravenous, Q12H, Dustin Flock, MD, 3 mL at 01/11/16 2116 .  traMADol (ULTRAM) tablet 50 mg, 50 mg, Oral, Q6H PRN, Dustin Flock, MD  IMAGING    Nm Pulmonary Perf And  Vent  01/11/2016  CLINICAL DATA:  Shortness of Breath EXAM: NUCLEAR MEDICINE VENTILATION - PERFUSION LUNG SCAN Views: Anterior, posterior, left lateral, right lateral, RPO, LPO, RAO, LAO -ventilation and perfusion RADIOPHARMACEUTICALS:  35.161 mCi Technetium-64m DTPA aerosol inhalation and 4.269 mCi Technetium-67m MAA IV COMPARISON:  Chest radiograph Jan 10, 2016 FINDINGS: Ventilation: There are a few scattered subsegmental foci of decreased attenuation. There is no segmental ventilation defect on either side. Perfusion: There are a few matching subsegmental lower lobe perfusion defects. There is no appreciable ventilation/perfusion  mismatch, and there are no perfusion defects at a segmental or nearly segmental level. Heart is noted to be prominent. IMPRESSION: No appreciable ventilation/ perfusion mismatch. Small subsegmental matching ventilation and perfusion defects. These findings constitute a low probability of pulmonary embolus. Electronically Signed   By: Lowella Grip III M.D.   On: 01/11/2016 14:22      MICRO DATA: MRSA PCR NEGATIVE  ANTIMICROBIALS: zosyn 5/3>>>    ASSESSMENT/PLAN  65 yo white female with multiple medical issues with severe acidosis from orthostatic hypotension, possible sepsis but no obvious source of infection at this time  PULMONARY -Oxygen as needed  CARDIOVASCULAR Follow up cardiology recs  RENAL Follow chem 7, follow UO  GASTROINTESTINAL Diet as tolerated  HEMATOLOGIC Follow CBC  INFECTIOUS Will consider stopping  abx   OK to transfer to gen med floor.   Corrin Parker, M.D.  [facility]  Medical Director [place] Director [facility] Cardio-Pulmonary Department

## 2016-01-13 DIAGNOSIS — Z7982 Long term (current) use of aspirin: Secondary | ICD-10-CM

## 2016-01-13 DIAGNOSIS — E785 Hyperlipidemia, unspecified: Secondary | ICD-10-CM

## 2016-01-13 DIAGNOSIS — I509 Heart failure, unspecified: Secondary | ICD-10-CM

## 2016-01-13 DIAGNOSIS — Z8543 Personal history of malignant neoplasm of ovary: Secondary | ICD-10-CM

## 2016-01-13 DIAGNOSIS — J449 Chronic obstructive pulmonary disease, unspecified: Secondary | ICD-10-CM

## 2016-01-13 DIAGNOSIS — Z9221 Personal history of antineoplastic chemotherapy: Secondary | ICD-10-CM

## 2016-01-13 DIAGNOSIS — R531 Weakness: Secondary | ICD-10-CM

## 2016-01-13 DIAGNOSIS — C519 Malignant neoplasm of vulva, unspecified: Secondary | ICD-10-CM

## 2016-01-13 DIAGNOSIS — M549 Dorsalgia, unspecified: Secondary | ICD-10-CM

## 2016-01-13 DIAGNOSIS — I7300 Raynaud's syndrome without gangrene: Secondary | ICD-10-CM

## 2016-01-13 DIAGNOSIS — I209 Angina pectoris, unspecified: Secondary | ICD-10-CM

## 2016-01-13 DIAGNOSIS — R609 Edema, unspecified: Secondary | ICD-10-CM

## 2016-01-13 DIAGNOSIS — M349 Systemic sclerosis, unspecified: Secondary | ICD-10-CM

## 2016-01-13 DIAGNOSIS — I2510 Atherosclerotic heart disease of native coronary artery without angina pectoris: Secondary | ICD-10-CM

## 2016-01-13 DIAGNOSIS — R000 Tachycardia, unspecified: Secondary | ICD-10-CM

## 2016-01-13 DIAGNOSIS — Z923 Personal history of irradiation: Secondary | ICD-10-CM

## 2016-01-13 DIAGNOSIS — Z79899 Other long term (current) drug therapy: Secondary | ICD-10-CM

## 2016-01-13 DIAGNOSIS — Z8589 Personal history of malignant neoplasm of other organs and systems: Secondary | ICD-10-CM

## 2016-01-13 DIAGNOSIS — I10 Essential (primary) hypertension: Secondary | ICD-10-CM

## 2016-01-13 DIAGNOSIS — N289 Disorder of kidney and ureter, unspecified: Secondary | ICD-10-CM

## 2016-01-13 DIAGNOSIS — G8929 Other chronic pain: Secondary | ICD-10-CM

## 2016-01-13 DIAGNOSIS — Z87891 Personal history of nicotine dependence: Secondary | ICD-10-CM

## 2016-01-13 DIAGNOSIS — I429 Cardiomyopathy, unspecified: Secondary | ICD-10-CM

## 2016-01-13 DIAGNOSIS — N179 Acute kidney failure, unspecified: Secondary | ICD-10-CM

## 2016-01-13 DIAGNOSIS — D72829 Elevated white blood cell count, unspecified: Secondary | ICD-10-CM

## 2016-01-13 DIAGNOSIS — K219 Gastro-esophageal reflux disease without esophagitis: Secondary | ICD-10-CM

## 2016-01-13 LAB — CBC
HCT: 41.7 % (ref 35.0–47.0)
Hemoglobin: 13.1 g/dL (ref 12.0–16.0)
MCH: 27.3 pg (ref 26.0–34.0)
MCHC: 31.5 g/dL — ABNORMAL LOW (ref 32.0–36.0)
MCV: 86.8 fL (ref 80.0–100.0)
PLATELETS: 83 10*3/uL — AB (ref 150–440)
RBC: 4.8 MIL/uL (ref 3.80–5.20)
RDW: 25.9 % — AB (ref 11.5–14.5)
WBC: 12.5 10*3/uL — AB (ref 3.6–11.0)

## 2016-01-13 LAB — BASIC METABOLIC PANEL
ANION GAP: 11 (ref 5–15)
BUN: 14 mg/dL (ref 6–20)
CALCIUM: 7.8 mg/dL — AB (ref 8.9–10.3)
CO2: 19 mmol/L — AB (ref 22–32)
Chloride: 102 mmol/L (ref 101–111)
Creatinine, Ser: 0.94 mg/dL (ref 0.44–1.00)
Glucose, Bld: 155 mg/dL — ABNORMAL HIGH (ref 65–99)
Potassium: 3.8 mmol/L (ref 3.5–5.1)
Sodium: 132 mmol/L — ABNORMAL LOW (ref 135–145)

## 2016-01-13 LAB — URINE CULTURE: CULTURE: NO GROWTH

## 2016-01-13 LAB — PHOSPHORUS: Phosphorus: 1.9 mg/dL — ABNORMAL LOW (ref 2.5–4.6)

## 2016-01-13 LAB — MAGNESIUM: Magnesium: 2.6 mg/dL — ABNORMAL HIGH (ref 1.7–2.4)

## 2016-01-13 LAB — FIBRIN DEGRADATION PROD.(ARMC ONLY): Fibrin Degradation Prod.: >10 — AB (ref <10)

## 2016-01-13 LAB — APTT

## 2016-01-13 MED ORDER — SACUBITRIL-VALSARTAN 24-26 MG PO TABS
1.0000 | ORAL_TABLET | Freq: Two times a day (BID) | ORAL | Status: DC
  Administered 2016-01-13 – 2016-01-14 (×3): 1 via ORAL
  Filled 2016-01-13 (×6): qty 1

## 2016-01-13 MED ORDER — CARVEDILOL 6.25 MG PO TABS
12.5000 mg | ORAL_TABLET | Freq: Two times a day (BID) | ORAL | Status: DC
  Administered 2016-01-13: 12.5 mg via ORAL
  Filled 2016-01-13 (×2): qty 2

## 2016-01-13 NOTE — Consult Note (Signed)
[facility]  Date of admission:  01/10/2016  Inpatient day:  01/13/2016  Consulting physician:  Dr. Dustin Flock  Reason for Consultation:  Cyanosis.  Chief Complaint: Cheryl Hamilton is a 65 y.o. female with scleroderma who was admitted with tachycardia, leukocytosis, general weakness, acute renal failure, hyperkalemia, and elevated troponin.  HPI:  The patient was diagnosed with scleroderma in 2009.  She has a positive SCL-70, high titer ANA, decreased DLCO likely secondary to COPD as well as Raynaud's type symptoms,  She has been followed at [facility] by Dr. Candee Furbish (last seen 10/05/2013).  She has been on minocycline for her arthralgias as well Plaquenil.  Labs from 10/04/2013 revealed a hematocrit of 39, hemoglobin 12.6, WBC 8500 and platelets 99,000.  Patient presented to the ER on 01/10/2016 with weakness and fatigue.  Labs revealed a hematocrit of 37.8, hemoglobin 11.8, MCV 87.7, platelets 113,000, and WBC 14,300.  Troponin was 0.41.  Lactic acid was 5.6.  PT was 23.2 with an INR of 2.08. PTT was 34.  BUN was 25 with a Cr 1.91.  Troponin was 1.57 on 01/11/2016.    CMP on 01/11/2016 revealed an albumen of 2.3, AST 425, ALT 227, alkaline phosphatase of 189, protein 5.1, bilirubin 1.6 (direct 0.8 and indirect 0.8).  Labs on 01/12/2016 revealed an albumen of 2.1 AST 101, ALT 150, alkaline phosphatase of 155, protein 5.0, bilirubin 10.    CBC today revealed a hematocrit of 41.8, hemoglobin 13.1, MCV 86.8 platelets 83,000, and WBC12,500.  BUN was 14 with a creatinine of 0.94 today.  ESR was 27, PTT 28 and fibrinogen 371 on 01/12/2016.  Lactic acid peaked at 9.0 on 01/11/2016 and was 4.0 on 01/12/2016.  CK was 1031 on 01/11/2016 and 881 on 01/12/2016.  Carboxyhemoglobin was 2.1%.  Ejection fraction was 60-65% on 06/23/2015, 40% on 09/07/2015, and 15% on 01/11/2016.  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  She descibes being nearly bed bound prior to  admission.  She gets up 2 times a day to "do things".  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  She underwent wide local excision of the vulva and bilateral inguinal lymphadenectomy on 07/13/2013.  Final pathology in the vagina was positive for malignancy.  She completed 36 Gy in 1.8 Gy fractions to the pelvis, and total dose of 64.8 Gy to the vaginal primary with concurrent cisplatin therapy on 11/08/2013. She has been NED until 11/22/2015 when she was diagnosed with vulvar disease with VIN3 involving bilateral right and left vulvar tissue.   She was scheduled for surgery (right vulvar WLE and left vulvar biopsies, EUA, and vaginal colposcopy) on 12/21/2015 then scheduled on 01/11/2016.    Past Medical History  Diagnosis Date  . CHF (congestive heart failure) ([place])   . Hypertension   . Coronary artery disease   . Renal insufficiency   . Scleroderma ([place])   . Chronic back pain   . GERD (gastroesophageal reflux disease)   . Blue skin   . COPD (chronic obstructive pulmonary disease) ([place])   . Raynaud disease   . Shortness of breath dyspnea   . Anginal pain ([place])   . Edema   . Hyperlipemia   . Ovarian cancer ([place])     chemo/rad  . Vaginal cancer ([place])   . Vulvar cancer [facility])     Past Surgical History  Procedure Laterality Date  . Back surgery  1990  . Vulva surgery    .  Nasal sinus surgery    . Cervical fusion    . Abdominal hysterectomy    . Cardiac catheterization  12/23/2013  . Coronary angioplasty with stent placement  12/23/2013  . Cardiac catheterization N/A 01/10/2015    Procedure: Left Heart Cath and Coronary Angiography;  Surgeon: Dionisio David, MD;  Location: [place] [facility];  Service: Cardiovascular;  Laterality: N/A;  . Breast biopsy Left 01/15/12    neg  . Breast biopsy Left     neg bx/clip  . Breast biopsy Right     neg-bx/clip    Family History  Problem Relation Age of Onset  . Hypertension Other      Social History:  reports that she quit smoking about 2 years ago. Her smoking use included Cigarettes. She has never used smokeless tobacco. She reports that she does not drink alcohol or use illicit drugs.  She lives with her son in [place].  The patient is alone today.  Allergies:  Allergies  Allergen Reactions  . Other Itching and Other (See Comments)    Pt states that she is allergic to most narcotics.      Medications Prior to Admission  Medication Sig Dispense Refill  . albuterol (PROVENTIL HFA;VENTOLIN HFA) 108 (90 Base) MCG/ACT inhaler Inhale 2 puffs into the lungs every 6 (six) hours as needed for wheezing or shortness of breath.    . ALPRAZolam (XANAX) 0.25 MG tablet Take 1 tablet (0.25 mg total) by mouth 3 (three) times daily as needed for anxiety. 24 tablet 0  . amitriptyline (ELAVIL) 25 MG tablet Take 25 mg by mouth at bedtime.    Marland Kitchen aspirin EC 81 MG tablet Take 81 mg by mouth daily.    . caffeine 200 MG TABS tablet Take 200 mg by mouth daily.     . clopidogrel (PLAVIX) 75 MG tablet Take 75 mg by mouth daily.    . fenofibrate (TRICOR) 145 MG tablet Take 145 mg by mouth daily.    . furosemide (LASIX) 20 MG tablet Take 20 mg by mouth 2 (two) times daily.    . metoprolol succinate (TOPROL-XL) 50 MG 24 hr tablet Take 50 mg by mouth daily. Take with or immediately following a meal.    . potassium chloride (MICRO-K) 10 MEQ CR capsule Take 10 mEq by mouth 3 (three) times daily.    . simvastatin (ZOCOR) 20 MG tablet Take 20 mg by mouth at bedtime.    Marland Kitchen spironolactone (ALDACTONE) 25 MG tablet Take 12.5 mg by mouth daily.    . traMADol (ULTRAM) 50 MG tablet Take 50 mg by mouth every 6 (six) hours as needed for moderate pain.      Review of Systems: GENERAL:  Feels describes being "awful tired".  No fevers or sweats.  Weight loss of 15-20 pounds since 10/2015. PERFORMANCE STATUS (ECOG):  3 HEENT:  No visual changes, runny nose, sore throat, mouth sores or tenderness. Lungs: No  shortness of breath or cough.  No hemoptysis. Cardiac:  No chest pain, palpitations, orthopnea, or PND. GI:  No nausea, vomiting, diarrhea, constipation, melena or hematochezia. GU:  No urgency, frequency, dysuria, or hematuria. Musculoskeletal:  No back pain.  No joint pain.  No muscle tenderness. Extremities:  Pain in feet (toes) > hands. Skin:  No rashes or skin changes. Neuro:  General weakness.  No headache, numbness or weakness, balance or coordination issues. Endocrine:  No diabetes, thyroid issues, hot flashes or night sweats. Psych:  No mood changes, depression or  anxiety. Pain:  Feet and hands painful. Review of systems:  All other systems reviewed and found to be negative.  Physical Exam:  Blood pressure 112/60, pulse 85, temperature 98.7 F (37.1 C), temperature source Oral, resp. rate 20, height 5' (1.524 m), weight 110 lb 3.7 oz (50 kg), SpO2 100 %.  GENERAL:  Chronically ill appearing woman lying comfortably on the medical unit in no acute distress. MENTAL STATUS:  Alert and oriented to person, place and time.  She is confused on how she got here. HEAD:  Short light brown hair.  Normocephalic, atraumatic, face symmetric, no Cushingoid features. EYES:  Glasses.  Light brown eyes.  Pupils equal round and reactive to light and accomodation.  No conjunctivitis or scleral icterus. ENT:  Oropharynx clear without lesion.  Tongue normal. Mucous membranes dry.  RESPIRATORY:  Clear to auscultation without rales, wheezes or rhonchi. CARDIOVASCULAR:  Regular rate and rhythm without murmur, rub or gallop. ABDOMEN:  Soft, non-tender, with active bowel sounds, and no hepatosplenomegaly.  No masses. SKIN:  Blue-gray hue.  Ecchymosis around mouth and forehead.  No rashes, ulcers or lesions. EXTREMITIES:  Diffuse sclerodactyly.  Hands slightly puffy/edematous with somwhat ruddy with clearing around nails. Right index finger tip with changes. Feet cool.  Toes with cyanosis as well as the plantar  surface of the feet.  Painful to touch left > right foot.  Pulses difficult to palpate.   LYMPH NODES: No palpable cervical, supraclavicular, axillary or inguinal adenopathy  NEUROLOGICAL:  Somewhat confused about events in recent past.  Follows commands.  Moves all 4 extremities. PSYCH:  Appropriate.  Results for orders placed or performed during the hospital encounter of 01/10/16 (from the past 48 hour(s))  Potassium     Status: Abnormal   Collection Time: 01/11/16  4:08 PM  Result Value Ref Range   Potassium 3.0 (L) 3.5 - 5.1 mmol/L  CK     Status: Abnormal   Collection Time: 01/11/16  4:08 PM  Result Value Ref Range   Total CK 1031 (H) 38 - 234 U/L  Differential     Status: Abnormal   Collection Time: 01/11/16  4:08 PM  Result Value Ref Range   Neutro Abs 17.7 (H) 1.4 - 6.5 K/uL   Lymphs Abs 1.6 1.0 - 3.6 K/uL   Monocytes Absolute 0.2 0.2 - 0.9 K/uL   Eosinophils Absolute 0.0 0 - 0.7 K/uL   Basophils Absolute 0.0 0 - 0.1 K/uL   Neutrophils Relative % 79 %   Lymphocytes Relative 8 %   Monocytes Relative 1 %   Eosinophils Relative 0 %   Basophils Relative 0 %   Band Neutrophils 11 %   Metamyelocytes Relative 1 %   Myelocytes 0 %   Promyelocytes Absolute 0 %   Blasts 0 %   nRBC 0 0 /100 WBC   Other 0 %   RBC Morphology POLYCHROMASIA PRESENT     Comment: MIXED RBC POPULATION  Protime-INR     Status: Abnormal   Collection Time: 01/11/16  4:08 PM  Result Value Ref Range   Prothrombin Time 22.6 (H) 11.4 - 15.0 seconds   INR 2.00   Urine culture     Status: None   Collection Time: 01/11/16  8:16 PM  Result Value Ref Range   Specimen Description URINE, RANDOM    Special Requests Immunocompromised    Culture NO GROWTH 2 DAYS    Report Status 01/13/2016 FINAL   Comprehensive metabolic panel  Status: Abnormal   Collection Time: 01/11/16  8:23 PM  Result Value Ref Range   Sodium 136 135 - 145 mmol/L   Potassium 3.6 3.5 - 5.1 mmol/L    Comment: HEMOLYSIS AT THIS LEVEL  MAY AFFECT RESULT   Chloride 105 101 - 111 mmol/L   CO2 17 (L) 22 - 32 mmol/L   Glucose, Bld 132 (H) 65 - 99 mg/dL   BUN 21 (H) 6 - 20 mg/dL   Creatinine, Ser 1.41 (H) 0.44 - 1.00 mg/dL   Calcium 7.2 (L) 8.9 - 10.3 mg/dL   Total Protein 4.9 (L) 6.5 - 8.1 g/dL   Albumin 2.1 (L) 3.5 - 5.0 g/dL   AST 261 (H) 15 - 41 U/L    Comment: HEMOLYSIS AT THIS LEVEL MAY AFFECT RESULT   ALT 213 (H) 14 - 54 U/L   Alkaline Phosphatase 149 (H) 38 - 126 U/L   Total Bilirubin 1.3 (H) 0.3 - 1.2 mg/dL    Comment: HEMOLYSIS AT THIS LEVEL MAY AFFECT RESULT   GFR calc non Af Amer 38 (L) >60 mL/min   GFR calc Af Amer 44 (L) >60 mL/min    Comment: (NOTE) The eGFR has been calculated using the CKD EPI equation. This calculation has not been validated in all clinical situations. eGFR's persistently <60 mL/min signify possible Chronic Kidney Disease.    Anion gap 14 5 - 15  CK     Status: Abnormal   Collection Time: 01/12/16  4:36 AM  Result Value Ref Range   Total CK 881 (H) 38 - 234 U/L  CBC     Status: Abnormal   Collection Time: 01/12/16  4:36 AM  Result Value Ref Range   WBC 16.1 (H) 3.6 - 11.0 K/uL   RBC 4.05 3.80 - 5.20 MIL/uL   Hemoglobin 11.2 (L) 12.0 - 16.0 g/dL   HCT 35.2 35.0 - 47.0 %   MCV 86.8 80.0 - 100.0 fL   MCH 27.6 26.0 - 34.0 pg   MCHC 31.9 (L) 32.0 - 36.0 g/dL   RDW 26.0 (H) 11.5 - 14.5 %   Platelets 88 (L) 150 - 440 K/uL  Magnesium     Status: Abnormal   Collection Time: 01/12/16  4:36 AM  Result Value Ref Range   Magnesium 1.5 (L) 1.7 - 2.4 mg/dL  Phosphorus     Status: Abnormal   Collection Time: 01/12/16  4:36 AM  Result Value Ref Range   Phosphorus 2.3 (L) 2.5 - 4.6 mg/dL  Basic metabolic panel     Status: Abnormal   Collection Time: 01/12/16 10:45 AM  Result Value Ref Range   Sodium 131 (L) 135 - 145 mmol/L   Potassium 3.1 (L) 3.5 - 5.1 mmol/L   Chloride 103 101 - 111 mmol/L   CO2 16 (L) 22 - 32 mmol/L   Glucose, Bld 136 (H) 65 - 99 mg/dL   BUN 17 6 - 20 mg/dL    Creatinine, Ser 1.10 (H) 0.44 - 1.00 mg/dL   Calcium 7.1 (L) 8.9 - 10.3 mg/dL   GFR calc non Af Amer 52 (L) >60 mL/min   GFR calc Af Amer 60 (L) >60 mL/min    Comment: (NOTE) The eGFR has been calculated using the CKD EPI equation. This calculation has not been validated in all clinical situations. eGFR's persistently <60 mL/min signify possible Chronic Kidney Disease.    Anion gap 12 5 - 15  Lactic acid, plasma     Status: Abnormal  Collection Time: 01/12/16 10:45 AM  Result Value Ref Range   Lactic Acid, Venous 2.1 (HH) 0.5 - 2.0 mmol/L    Comment: CRITICAL RESULT CALLED TO, READ BACK BY AND VERIFIED WITH PAM CRAWFORD AT 1142 01/12/16 DAS   Lactic acid, plasma     Status: Abnormal   Collection Time: 01/12/16  2:37 PM  Result Value Ref Range   Lactic Acid, Venous 4.0 (HH) 0.5 - 2.0 mmol/L    Comment: CRITICAL RESULT CALLED TO, READ BACK BY AND VERIFIED WITH CHARLI FLEETWOOD AT 1520 01/12/2016 BY TFK   Fibrinogen     Status: None   Collection Time: 01/12/16  7:56 PM  Result Value Ref Range   Fibrinogen 371 210 - 470 mg/dL  Sedimentation rate     Status: None   Collection Time: 01/12/16  7:56 PM  Result Value Ref Range   Sed Rate 27 0 - 30 mm/hr  APTT     Status: None   Collection Time: 01/12/16  7:56 PM  Result Value Ref Range   aPTT 28 24 - 36 seconds  Comprehensive metabolic panel     Status: Abnormal   Collection Time: 01/12/16 10:25 PM  Result Value Ref Range   Sodium 131 (L) 135 - 145 mmol/L   Potassium 3.3 (L) 3.5 - 5.1 mmol/L   Chloride 102 101 - 111 mmol/L   CO2 17 (L) 22 - 32 mmol/L   Glucose, Bld 131 (H) 65 - 99 mg/dL   BUN 16 6 - 20 mg/dL   Creatinine, Ser 1.09 (H) 0.44 - 1.00 mg/dL   Calcium 7.4 (L) 8.9 - 10.3 mg/dL   Total Protein 5.0 (L) 6.5 - 8.1 g/dL   Albumin 2.1 (L) 3.5 - 5.0 g/dL   AST 101 (H) 15 - 41 U/L   ALT 150 (H) 14 - 54 U/L   Alkaline Phosphatase 155 (H) 38 - 126 U/L   Total Bilirubin 1.0 0.3 - 1.2 mg/dL   GFR calc non Af Amer 52 (L) >60  mL/min   GFR calc Af Amer >60 >60 mL/min    Comment: (NOTE) The eGFR has been calculated using the CKD EPI equation. This calculation has not been validated in all clinical situations. eGFR's persistently <60 mL/min signify possible Chronic Kidney Disease.    Anion gap 12 5 - 15  Magnesium     Status: Abnormal   Collection Time: 01/13/16  5:42 AM  Result Value Ref Range   Magnesium 2.6 (H) 1.7 - 2.4 mg/dL  Basic metabolic panel     Status: Abnormal   Collection Time: 01/13/16  5:42 AM  Result Value Ref Range   Sodium 132 (L) 135 - 145 mmol/L   Potassium 3.8 3.5 - 5.1 mmol/L   Chloride 102 101 - 111 mmol/L   CO2 19 (L) 22 - 32 mmol/L   Glucose, Bld 155 (H) 65 - 99 mg/dL   BUN 14 6 - 20 mg/dL   Creatinine, Ser 0.94 0.44 - 1.00 mg/dL   Calcium 7.8 (L) 8.9 - 10.3 mg/dL   GFR calc non Af Amer >60 >60 mL/min   GFR calc Af Amer >60 >60 mL/min    Comment: (NOTE) The eGFR has been calculated using the CKD EPI equation. This calculation has not been validated in all clinical situations. eGFR's persistently <60 mL/min signify possible Chronic Kidney Disease.    Anion gap 11 5 - 15  Phosphorus     Status: Abnormal   Collection Time: 01/13/16  5:42 AM  Result Value Ref Range   Phosphorus 1.9 (L) 2.5 - 4.6 mg/dL  CBC     Status: Abnormal   Collection Time: 01/13/16  5:42 AM  Result Value Ref Range   WBC 12.5 (H) 3.6 - 11.0 K/uL   RBC 4.80 3.80 - 5.20 MIL/uL   Hemoglobin 13.1 12.0 - 16.0 g/dL   HCT 41.7 35.0 - 47.0 %   MCV 86.8 80.0 - 100.0 fL   MCH 27.3 26.0 - 34.0 pg   MCHC 31.5 (L) 32.0 - 36.0 g/dL   RDW 25.9 (H) 11.5 - 14.5 %   Platelets 83 (L) 150 - 440 K/uL  Fibrin Degradation Products ([facility] only)     Status: Abnormal   Collection Time: 01/13/16  8:57 AM  Result Value Ref Range   Fibrin Degradation Prod. >10 AND <40 (A) <10   US Abdomen Limited Ruq  01/12/2016  CLINICAL DATA:  Elevated transaminase levels. EXAM: US ABDOMEN LIMITED - RIGHT UPPER QUADRANT COMPARISON:   Abdominal and pelvic CT scan of March 23 2012. FINDINGS: Bowel gas limits evaluation of some of the liver images. Gallbladder: The gallbladder is adequately distended. There is a small amount of echogenic sludge or bile present. No stones are evident. There is no gallbladder wall thickening, pericholecystic fluid, or positive sonographic Murphy's sign. Common bile duct: Diameter: 2.9 mm Liver: The hepatic echotexture is normal. There is no intrahepatic ductal dilation. The surface contour of the liver is normal. However, there is small amount of ascites surrounding the liver. Incidental note is made of a small right kidney measuring 8.9 cm in length. IMPRESSION: 1. Small amount of sludge within the gallbladder but no discrete stones are observed. Further evaluation with a nuclear medicine hepatobiliary scan may be useful if gallbladder dysfunction is suspected clinically. 2. No ultrasonic abnormality of the visualized portions of the liver or common bile duct. However, there is a small amount of ascites surrounding the liver. 3. Small right kidney without evidence of obstruction. Electronically Signed   By: David  Martinique M.D.   On: 01/12/2016 09:24    Assessment:  The patient is a 65 y.o. woman with scleroderma admitted with general weakness.  She has had persistent lactic acidosis.  Troponin elevated.  Echo reveals a dramatic drop in EF to 15% with 4 chamber enlargement.  Patient has peripheral stigmata of scleroderma with poor perfusion secondary to decline in EF.  Pulses are difficult to appreciate.  Feet are cool (left > right).    Labs reveal no evidence of DIC.  Patient has a slightly low platelet count at baseline which has only declined slightly.  Fibrinogen and PTT are normal.  PT elevated with a low albumen likely secondary to nutritional status.  Elevated LFTs possibly secondary to poor liver perfusion.  Acidosis (CO2 10-19).  Renal function improved (Cr  1.91 - 0.94).  Plan:   1.  Follow-up  laboratory evaluation by rheumatology:  lupus anticoagulant, cardiolipin antibodies. 2.  Additional labs:  PT with mix, immunoglobulin levels, cryoglobulins, hepatitis C testing. 3.  Concern for decline in cardiac function possibly secondary to scleroderma. 4.  May need tertiary care evaluation.  Thank you for allowing me to participate in Cheryl Hamilton 's care.  I will follow her closely with you while hospitalized and after discharge in the outpatient department.   Lequita Asal, MD  01/13/2016, 3:48 PM

## 2016-01-13 NOTE — Progress Notes (Signed)
PARENTERAL NUTRITION CONSULT NOTE - Follow up  Pharmacy Consult for Electrolyte Monitoring Indication: Hypokalemia  Allergies  Allergen Reactions  . Other Itching and Other (See Comments)    Pt states that she is allergic to most narcotics.      Patient Measurements: Height: 5' (152.4 cm) Weight: 110 lb 3.7 oz (50 kg) IBW/kg (Calculated) : 45.5  Vital Signs: Temp: 98.1 F (36.7 C) (05/06 0716) Temp Source: Oral (05/06 0716) BP: 135/75 mmHg (05/06 0716) Pulse Rate: 93 (05/06 0716) Intake/Output from previous day: 05/05 0701 - 05/06 0700 In: 1280.7 [P.O.:600; I.V.:580.7; IV Piggyback:100] Out: 450 [Urine:450] Intake/Output from this shift: Total I/O In: 136 [I.V.:136] Out: 0   Labs:  Recent Labs  01/10/16 1541 01/11/16 0340 01/11/16 1608 01/12/16 0436 01/12/16 1956 01/13/16 0542  WBC 14.3* 20.0*  --  16.1*  --  12.5*  HGB 11.8* 10.6*  --  11.2*  --  13.1  HCT 37.8 35.1  --  35.2  --  41.7  PLT 113* 89*  --  88*  --  83*  APTT 34  --   --   --  28  --   INR 2.08  --  2.00  --   --   --      Recent Labs  01/10/16 1541  01/11/16 0340  01/11/16 2023 01/12/16 0436 01/12/16 1045 01/12/16 2225 01/13/16 0542  NA 131*  --  135  --  136  --  131* 131* 132*  K 6.5*  --  5.4*  < > 3.6  --  3.1* 3.3* 3.8  CL 102  --  107  --  105  --  103 102 102  CO2 11*  --  10*  --  17*  --  16* 17* 19*  GLUCOSE 152*  --  91  --  132*  --  136* 131* 155*  BUN 25*  --  23*  --  21*  --  17 16 14   CREATININE 1.91*  < > 1.51*  --  1.41*  --  1.10* 1.09* 0.94  CALCIUM 8.3*  --  8.0*  --  7.2*  --  7.1* 7.4* 7.8*  MG 1.9  --   --   --   --  1.5*  --   --  2.6*  PHOS  --   --   --   --   --  2.3*  --   --  1.9*  PROT  --   --  5.1*  --  4.9*  --   --  5.0*  --   ALBUMIN  --   --  2.3*  --  2.1*  --   --  2.1*  --   AST  --   --  425*  --  261*  --   --  101*  --   ALT  --   --  227*  --  213*  --   --  150*  --   ALKPHOS  --   --  189*  --  149*  --   --  155*  --   BILITOT  --    --  1.6*  --  1.3*  --   --  1.0  --   BILIDIR  --   --  0.8*  --   --   --   --   --   --   IBILI  --   --  0.8  --   --   --   --   --   --   < > =  values in this interval not displayed. Estimated Creatinine Clearance: 42.9 mL/min (by C-G formula based on Cr of 0.94).    Recent Labs  01/10/16 2004 01/10/16 2046 01/11/16 0030  GLUCAP <10* 109* 123*    Medical History: Past Medical History  Diagnosis Date  . CHF (congestive heart failure) ([place])   . Hypertension   . Coronary artery disease   . Renal insufficiency   . Scleroderma ([place])   . Chronic back pain   . GERD (gastroesophageal reflux disease)   . Blue skin   . COPD (chronic obstructive pulmonary disease) ([place])   . Raynaud disease   . Shortness of breath dyspnea   . Anginal pain ([place])   . Edema   . Hyperlipemia   . Ovarian cancer ([place])     chemo/rad  . Vaginal cancer ([place])   . Vulvar cancer ([place])     Medications:  Scheduled:  . amitriptyline  25 mg Oral QHS  . antiseptic oral rinse  7 mL Mouth Rinse BID  . aspirin EC  81 mg Oral Daily  . clopidogrel  75 mg Oral Daily  . fenofibrate  160 mg Oral Daily  . metoprolol succinate  50 mg Oral Daily  . potassium & sodium phosphates  1 packet Oral TID WC & HS  . sodium chloride flush  3 mL Intravenous Q12H   Infusions:  . 0.9 % NaCl with KCl 20 mEq / L 40 mL/hr at 01/12/16 1429    Assessment: Pharmacy consulted to assist in managing electrolytes in this 65 y/o F with hypokalemia.   Plan:  Magnesium 4 g iv once. Patient on phos-nak tabs. Will give potassium chloride 20 meq po x 2 and f/u am labs.  5/6 K=3.8 , Mag=2.6, Phos= 1.9.  Patient currently on NS w/ KCL 5meq/L at 40 ml/hr and Phos-NAK packet w/meals and bedtime.  Will continue current therapy as patient had only received 2 doses of Phos-NAK so far. F/u w/ am labs.  Erique Kaser A 01/13/2016,8:02 AM

## 2016-01-13 NOTE — NC FL2 (Signed)
[place] LEVEL OF CARE SCREENING TOOL     IDENTIFICATION  Patient Name: Cheryl Hamilton [facility] Birthdate: 12-22-1950 Sex: female Admission Date (Current Location): 01/10/2016  [place] and [place] Number:  Engineering geologist and Address:  [facility], [address] [place], [place] [address]      Provider Number: [id number]  Attending Physician Name and Address:  Nicholes Mango, MD  Relative Name and Phone Number:       Current Level of Care: Hospital Recommended Level of Care: [place] Prior Approval Number:    Date Approved/Denied:   PASRR Number:  ( [id number] )  Discharge Plan: SNF    Current Diagnoses: Patient Active Problem List   Diagnosis Date Noted  . Sepsis ([place]) 01/10/2016  . CHF (congestive heart failure) ([place]) 09/07/2015  . COPD exacerbation ([place]) 09/07/2015  . Scleroderma ([place]) 06/12/2015  . ARF (acute renal failure) ([place]) 06/12/2015  . Intracranial bleed ([place]) 06/12/2015  . C7 cervical fracture ([place]) 06/12/2015  . CAD (coronary artery disease) s/p PCI. 06/12/2015  . Chronic anemia 06/12/2015  . Acute encephalopathy 06/12/2015  . Orthostatic hypotension 06/12/2015  . Vaginal cancer ([place]) 04/26/2015  . Raynaud disease 12/28/2014  . Chronic systolic heart failure (HCC) 12/28/2014    Orientation RESPIRATION BLADDER Height & Weight     Self, Time, Place  O2 (4 Liters Oxygen ) Incontinent Weight: 110 lb 3.7 oz (50 kg) Height:  5' (152.4 cm)  BEHAVIORAL SYMPTOMS/MOOD NEUROLOGICAL BOWEL NUTRITION STATUS   (none )  (none ) Continent Diet (Diet: 2 Gram Sodium )  AMBULATORY STATUS COMMUNICATION OF NEEDS Skin   Extensive Assist Verbally Normal                       Personal Care Assistance Level of Assistance  Bathing, Feeding, Dressing Bathing Assistance: Limited assistance Feeding assistance: Independent Dressing Assistance: Limited assistance     Functional Limitations Info  Sight,  Hearing, Speech Sight Info: Adequate Hearing Info: Adequate Speech Info: Adequate    SPECIAL CARE FACTORS FREQUENCY  PT (By licensed PT), OT (By licensed OT)     PT Frequency:  (5) OT Frequency:  (5)            Contractures      Additional Factors Info  Code Status, Allergies, Psychotropic Code Status Info:  (Full Code. ) Allergies Info:  (Not listed ) Psychotropic Info:  (PRN Xanax)         Current Medications (01/13/2016):  This is the current hospital active medication list Current Facility-Administered Medications  Medication Dose Route Frequency Provider Last Rate Last Dose  . 0.9 % NaCl with KCl 20 mEq/ L  infusion   Intravenous Continuous Theodoro Grist, MD 40 mL/hr at 01/12/16 1429    . albuterol (PROVENTIL) (2.5 MG/3ML) 0.083% nebulizer solution 2.5 mg  2.5 mg Inhalation Q6H PRN Dustin Flock, MD      . ALPRAZolam Duanne Moron) tablet 0.25 mg  0.25 mg Oral TID PRN Dustin Flock, MD   0.25 mg at 01/12/16 1134  . amitriptyline (ELAVIL) tablet 25 mg  25 mg Oral QHS Dustin Flock, MD   25 mg at 01/12/16 2247  . antiseptic oral rinse (CPC / CETYLPYRIDINIUM CHLORIDE 0.05%) solution 7 mL  7 mL Mouth Rinse BID Dustin Flock, MD   7 mL at 01/13/16 0754  . aspirin EC tablet 81 mg  81 mg Oral Daily Dustin Flock, MD   81 mg at 01/13/16 0753  .  clopidogrel (PLAVIX) tablet 75 mg  75 mg Oral Daily Dustin Flock, MD   75 mg at 01/13/16 0754  . fenofibrate tablet 160 mg  160 mg Oral Daily Dustin Flock, MD   160 mg at 01/13/16 0754  . metoprolol succinate (TOPROL-XL) 24 hr tablet 50 mg  50 mg Oral Daily Dustin Flock, MD   50 mg at 01/13/16 0754  . oxyCODONE (Oxy IR/ROXICODONE) immediate release tablet 5 mg  5 mg Oral Q4H PRN Theodoro Grist, MD   5 mg at 01/12/16 2247  . potassium & sodium phosphates (PHOS-NAK) 280-160-250 MG packet 1 packet  1 packet Oral TID WC & HS Theodoro Grist, MD   1 packet at 01/13/16 0755  . sodium chloride flush (NS) 0.9 % injection 3 mL  3 mL Intravenous  Q12H Dustin Flock, MD   3 mL at 01/13/16 0754  . traMADol (ULTRAM) tablet 50 mg  50 mg Oral Q6H PRN Dustin Flock, MD   50 mg at 01/12/16 0813     Discharge Medications: Please see discharge summary for a list of discharge medications.  Relevant Imaging Results:  Relevant Lab Results:   Additional Information  (SSN: [id number])  Loralyn Freshwater, LCSW

## 2016-01-13 NOTE — Clinical Social Work Placement (Signed)
   CLINICAL SOCIAL WORK PLACEMENT  NOTE  Date:  01/13/2016  Patient Details  Name: Cheryl Hamilton MRN: [id number] Date of Birth: [birth year]  Clinical Social Work is seeking post-discharge placement for this patient at the [place] level of care (*CSW will initial, date and re-position this form in  chart as items are completed):  Yes   Patient/family provided with [place] Work Department's list of facilities offering this level of care within the geographic area requested by the patient (or if unable, by the patient's family).  Yes   Patient/family informed of their freedom to choose among providers that offer the needed level of care, that participate in Medicare, Medicaid or managed care program needed by the patient, have an available bed and are willing to accept the patient.  Yes   Patient/family informed of [facility]'s ownership interest in [facility] and [facility], as well as of the fact that they are under no obligation to receive care at these facilities.  PASRR submitted to EDS on       PASRR number received on       Existing PASRR number confirmed on 01/13/16     FL2 transmitted to all facilities in geographic area requested by pt/family on 01/13/16     FL2 transmitted to all facilities within larger geographic area on       Patient informed that his/her managed care company has contracts with or will negotiate with certain facilities, including the following:            Patient/family informed of bed offers received.  Patient chooses bed at       Physician recommends and patient chooses bed at      Patient to be transferred to   on  .  Patient to be transferred to facility by       Patient family notified on   of transfer.  Name of family member notified:        PHYSICIAN       Additional Comment:    _______________________________________________ Loralyn Freshwater, LCSW 01/13/2016, 11:02 AM

## 2016-01-13 NOTE — Clinical Social Work Note (Signed)
Clinical Social Work Assessment  Patient Details  Name: Cheryl Hamilton MRN: [id number] Date of Birth: 1950-10-21  Date of referral:  01/13/16               Reason for consult:  Facility Placement                Permission sought to share information with:  Chartered certified accountant granted to share information::  Yes, Verbal Permission Granted  Name::      [place]::   [place]   Relationship::     Contact Information:     Housing/Transportation Living arrangements for the past 2 months:  [place] of Information:  Adult Children Patient Interpreter Needed:  None Criminal Activity/Legal Involvement Pertinent to Current Situation/Hospitalization:  No - Comment as needed Significant Relationships:  Adult Children Lives with:  Adult Children Do you feel safe going back to the place where you live?   (unable to assess) Need for family participation in patient care:  Yes (Comment)  Care giving concerns:  Patient lives in [place] with her son Merrily Pew.    Social Worker assessment / plan:  Holiday representative (CSW) reviewed chart and noted that PT is recommending SNF. CSW attempted to meet with patient however she appeared lethargic and did not answer questions. CSW contacted patient's son Merrily Pew. Per Josh patient lives with him in [place] and usually can get around with a cane. Per Merrily Pew his brother Catalina Antigua lives in [place], [place]. CSW contacted patient's son Catalina Antigua. Per Catalina Antigua he is the executer of patient's estate. Per [facility] patient has been in and out of several rehabs. Per Matt patient's cancer is back and she was suppose to have surgery this week however she was not cleared by the cardiologist. CSW explained that PT is currently recommending SNF. CSW explained that patient has Medicare and will need a 3 night inpatient qualifying stay at [facility] for Medicare to pay for rehab. Patient was admitted to inpatient on 01/10/16. CSW also  explained that patient will have to have a 60 day wellness period out of rehab and the hospital in order for her Medicare SNF days to start over. Matt verbalized his understanding and is agreeable to SNF search.   FL2 complete and faxed out. CSW will continue to follow and assist as needed.     Employment status:  Retired Forensic scientist:  [employer] PT Recommendations:  [place] / Referral to community resources:  [place]  Patient/Family's Response to care:  Patient's son Catalina Antigua is agreeable to [employer] in [place].   Patient/Family's Understanding of and Emotional Response to Diagnosis, Current Treatment, and Prognosis:  Patient's sons were pleasant and thanked CSW for calling.   Emotional Assessment Appearance:  Appears stated age Attitude/Demeanor/Rapport:  Unable to Assess Affect (typically observed):  Unable to Assess Orientation:  Oriented to Self, Oriented to Place, Oriented to  Time, Fluctuating Orientation (Suspected and/or reported Sundowners) Alcohol / Substance use:  Not Applicable Psych involvement (Current and /or in the community):  No (Comment)  Discharge Needs  Concerns to be addressed:  Discharge Planning Concerns Readmission within the last 30 days:  No Current discharge risk:  Chronically ill, Dependent with Mobility Barriers to Discharge:  Continued Medical Work up   Loralyn Freshwater, LCSW 01/13/2016, 11:03 AM

## 2016-01-13 NOTE — Progress Notes (Signed)
SUBJECTIVE: No chest pain   Filed Vitals:   01/13/16 0003 01/13/16 0349 01/13/16 0716 01/13/16 1105  BP: 119/75 120/85 135/75 121/82  Pulse: 139 97 93 92  Temp: 97.7 F (36.5 C) 97.6 F (36.4 C) 98.1 F (36.7 C) 97.7 F (36.5 C)  TempSrc: Oral Oral Oral Oral  Resp: 18 16 18 19   Height:      Weight:      SpO2:    100%    Intake/Output Summary (Last 24 hours) at 01/13/16 1210 Last data filed at 01/13/16 1154  Gross per 24 hour  Intake 1716.67 ml  Output    200 ml  Net 1516.67 ml    LABS: Basic Metabolic Panel:  Recent Labs  01/12/16 0436  01/12/16 2225 01/13/16 0542  NA  --   < > 131* 132*  K  --   < > 3.3* 3.8  CL  --   < > 102 102  CO2  --   < > 17* 19*  GLUCOSE  --   < > 131* 155*  BUN  --   < > 16 14  CREATININE  --   < > 1.09* 0.94  CALCIUM  --   < > 7.4* 7.8*  MG 1.5*  --   --  2.6*  PHOS 2.3*  --   --  1.9*  < > = values in this interval not displayed. Liver Function Tests:  Recent Labs  01/11/16 2023 01/12/16 2225  AST 261* 101*  ALT 213* 150*  ALKPHOS 149* 155*  BILITOT 1.3* 1.0  PROT 4.9* 5.0*  ALBUMIN 2.1* 2.1*    Recent Labs  01/10/16 1541  LIPASE 27   CBC:  Recent Labs  01/11/16 1608 01/12/16 0436 01/13/16 0542  WBC  --  16.1* 12.5*  NEUTROABS 17.7*  --   --   HGB  --  11.2* 13.1  HCT  --  35.2 41.7  MCV  --  86.8 86.8  PLT  --  88* 83*   Cardiac Enzymes:  Recent Labs  01/10/16 2054 01/11/16 0029 01/11/16 0340 01/11/16 1608 01/12/16 0436  CKTOTAL  --   --   --  1031* 881*  TROPONINI 0.91* 1.34* 1.57*  --   --    BNP: Invalid input(s): POCBNP D-Dimer: No results for input(s): DDIMER in the last 72 hours. Hemoglobin A1C: No results for input(s): HGBA1C in the last 72 hours. Fasting Lipid Panel: No results for input(s): CHOL, HDL, LDLCALC, TRIG, CHOLHDL, LDLDIRECT in the last 72 hours. Thyroid Function Tests:  Recent Labs  01/10/16 2054  TSH 2.164   Anemia Panel: No results for input(s): VITAMINB12,  FOLATE, FERRITIN, TIBC, IRON, RETICCTPCT in the last 72 hours.   PHYSICAL EXAM General: Well developed, well nourished, in no acute distress HEENT:  Normocephalic and atramatic Neck:  No JVD.  Lungs: Clear bilaterally to auscultation and percussion. Heart: HRRR . Normal S1 and S2 without gallops or murmurs.  Abdomen: Bowel sounds are positive, abdomen soft and non-tender  Msk:  Back normal, normal gait. Normal strength and tone for age. Extremities: No clubbing, cyanosis or edema.   Neuro: Alert and oriented X 3. Psych:  Good affect, responds appropriately  TELEMETRY:Sinus rhythm  ASSESSMENT AND PLAN: Severe LV dysfunction with left ventricular ejection fraction 14% dilated left ventricle and history of coronary artery disease. Patient presented with metabolic acidosis and shortness of breath. Scleroderma apparently is causing multiple issues including skin changes and cardiomyopathy. Advise Coreg, and entresto  and Aldactone.  Active Problems:   Sepsis ([place])    Dionisio David, MD, [facility] 01/13/2016 12:10 PM

## 2016-01-13 NOTE — Progress Notes (Addendum)
[place] [facility] [place] NAME: Cheryl Hamilton    MR#:  [id number]  DATE OF BIRTH:  July 02, 1951  SUBJECTIVE:  CHIEF COMPLAINT:   Chief Complaint  Patient presents with  . Fatigue  Patient is 65 year old Caucasian female with past medical history significant for vulvar cancer, scleroderma, who presents to the hospital with complaints of dizziness, especially whenever she stands up, shortness of breath. On arrival to the hospital patient was noted to be tachycardic, acidotic. Labs revealed chronic renal insufficiency, although worsened in the past one month, hyperkalemia, elevated transaminases. Lactic acid level of 9.0. At its peak, elevated troponin, leukocytosis, thrombocytopenia. Due to concerns of infection/sepsis. Patient was initiated on broad-spectrum antibiotic therapy, blood cultures were negative, MRSA screen was negative, urine culture was negative. Patient is off antibiotics now. Lactic acid level improved with hydration, oxygenation, patient's oxygen saturations remained stable. No obvious infection noted, no fever. Patient complains of pain in the fingertips Today patient is resting comfortably during my examination. Lethargic but arousable and answers questions. She doesn't know why she is feeling weak and tired  . Review of Systems  Constitutional: Positive for malaise/fatigue. Negative for fever, chills and weight loss.  HENT: Negative for congestion.   Eyes: Negative for blurred vision and double vision.  Respiratory: Negative for cough, sputum production, shortness of breath and wheezing.   Cardiovascular: Negative for chest pain, palpitations, orthopnea, leg swelling and PND.  Gastrointestinal: Negative for nausea, vomiting, abdominal pain, diarrhea, constipation and blood in stool.  Genitourinary: Negative for dysuria, urgency, frequency and hematuria.  Musculoskeletal: Positive for joint pain. Negative for falls.  Neurological:  Positive for dizziness. Negative for tremors, focal weakness and headaches.  Endo/Heme/Allergies: Does not bruise/bleed easily.  Psychiatric/Behavioral: Negative for depression. The patient does not have insomnia.     VITAL SIGNS: Blood pressure 135/75, pulse 93, temperature 98.1 F (36.7 C), temperature source Oral, resp. rate 18, height 5' (1.524 m), weight 50 kg (110 lb 3.7 oz), SpO2 90 %.  PHYSICAL EXAMINATION:   GENERAL:  65 y.o.-year-old patient lying in the no significant distress, remains pale, gray discoloration of face,. Cyanotic EYES: Pupils equal, round, reactive to light and accommodation. No scleral icterus. Extraocular muscles intact.  HEENT: Head atraumatic, normocephalic. Oropharynx and nasopharynx clear.  NECK:  Supple, no jugular venous distention. No thyroid enlargement, no tenderness.  LUNGS: Normal breath sounds bilaterally, no wheezing, rales,rhonchi or crepitations, not using respiratory muscles, unless with longer sentences  CARDIOVASCULAR: S1, S2 , tachycardic.  No murmurs, rubs, or gallops.  ABDOMEN: Soft, nontender, nondistended. Bowel sounds present. No organomegaly or mass.  EXTREMITIES: No pedal edema, bilateral upper extremity and lower extremity cyanosis, and clubbing.  NEUROLOGIC: Cranial nerves II through XII are intact. Muscle strength 5/5 in all extremities. Sensation intact. Gait not checked.  PSYCHIATRIC: The patient is alert , oriented 3   SKIN: No obvious rash, lesion, or ulcer. Ulcerations in fingertips, blue discoloration, but skin is warm    ORDERS/RESULTS REVIEWED:   CBC  Recent Labs Lab 01/10/16 1541 01/11/16 0340 01/11/16 1608 01/12/16 0436 01/13/16 0542  WBC 14.3* 20.0*  --  16.1* 12.5*  HGB 11.8* 10.6*  --  11.2* 13.1  HCT 37.8 35.1  --  35.2 41.7  PLT 113* 89*  --  88* 83*  MCV 87.7 90.4  --  86.8 86.8  MCH 27.4 27.3  --  27.6 27.3  MCHC 31.2* 30.2*  --  31.9* 31.5*  RDW 26.6* 26.1*  --  26.0* 25.9*  LYMPHSABS  --   --  1.6   --   --   MONOABS  --   --  0.2  --   --   EOSABS  --   --  0.0  --   --   BASOSABS  --   --  0.0  --   --    ------------------------------------------------------------------------------------------------------------------  Chemistries   Recent Labs Lab 01/10/16 1541  01/11/16 0340 01/11/16 1608 01/11/16 2023 01/12/16 0436 01/12/16 1045 01/12/16 2225 01/13/16 0542  NA 131*  --  135  --  136  --  131* 131* 132*  K 6.5*  --  5.4* 3.0* 3.6  --  3.1* 3.3* 3.8  CL 102  --  107  --  105  --  103 102 102  CO2 11*  --  10*  --  17*  --  16* 17* 19*  GLUCOSE 152*  --  91  --  132*  --  136* 131* 155*  BUN 25*  --  23*  --  21*  --  17 16 14   CREATININE 1.91*  < > 1.51*  --  1.41*  --  1.10* 1.09* 0.94  CALCIUM 8.3*  --  8.0*  --  7.2*  --  7.1* 7.4* 7.8*  MG 1.9  --   --   --   --  1.5*  --   --  2.6*  AST  --   --  425*  --  261*  --   --  101*  --   ALT  --   --  227*  --  213*  --   --  150*  --   ALKPHOS  --   --  189*  --  149*  --   --  155*  --   BILITOT  --   --  1.6*  --  1.3*  --   --  1.0  --   < > = values in this interval not displayed. ------------------------------------------------------------------------------------------------------------------ estimated creatinine clearance is 42.9 mL/min (by C-G formula based on Cr of 0.94). ------------------------------------------------------------------------------------------------------------------  Recent Labs  01/10/16 2054  TSH 2.164    Cardiac Enzymes  Recent Labs Lab 01/10/16 2054 01/11/16 0029 01/11/16 0340  TROPONINI 0.91* 1.34* 1.57*   ------------------------------------------------------------------------------------------------------------------ Invalid input(s): POCBNP ---------------------------------------------------------------------------------------------------------------  RADIOLOGY: Nm Pulmonary Perf And Vent  01/11/2016  CLINICAL DATA:  Shortness of Breath EXAM: NUCLEAR MEDICINE  VENTILATION - PERFUSION LUNG SCAN Views: Anterior, posterior, left lateral, right lateral, RPO, LPO, RAO, LAO -ventilation and perfusion RADIOPHARMACEUTICALS:  35.161 mCi Technetium-21m DTPA aerosol inhalation and 4.269 mCi Technetium-41m MAA IV COMPARISON:  Chest radiograph Jan 10, 2016 FINDINGS: Ventilation: There are a few scattered subsegmental foci of decreased attenuation. There is no segmental ventilation defect on either side. Perfusion: There are a few matching subsegmental lower lobe perfusion defects. There is no appreciable ventilation/perfusion mismatch, and there are no perfusion defects at a segmental or nearly segmental level. Heart is noted to be prominent. IMPRESSION: No appreciable ventilation/ perfusion mismatch. Small subsegmental matching ventilation and perfusion defects. These findings constitute a low probability of pulmonary embolus. Electronically Signed   By: Lowella Grip III M.D.   On: 01/11/2016 14:22   US Abdomen Limited Ruq  01/12/2016  CLINICAL DATA:  Elevated transaminase levels. EXAM: US ABDOMEN LIMITED - RIGHT UPPER QUADRANT COMPARISON:  Abdominal and pelvic CT scan of March 23 2012. FINDINGS: Bowel gas limits evaluation of some of the liver images.  Gallbladder: The gallbladder is adequately distended. There is a small amount of echogenic sludge or bile present. No stones are evident. There is no gallbladder wall thickening, pericholecystic fluid, or positive sonographic Murphy's sign. Common bile duct: Diameter: 2.9 mm Liver: The hepatic echotexture is normal. There is no intrahepatic ductal dilation. The surface contour of the liver is normal. However, there is small amount of ascites surrounding the liver. Incidental note is made of a small right kidney measuring 8.9 cm in length. IMPRESSION: 1. Small amount of sludge within the gallbladder but no discrete stones are observed. Further evaluation with a nuclear medicine hepatobiliary scan may be useful if gallbladder  dysfunction is suspected clinically. 2. No ultrasonic abnormality of the visualized portions of the liver or common bile duct. However, there is a small amount of ascites surrounding the liver. 3. Small right kidney without evidence of obstruction. Electronically Signed   By: David  Martinique M.D.   On: 01/12/2016 09:24    EKG:  Orders placed or performed during the hospital encounter of 01/10/16  . EKG 12-Lead  . EKG 12-Lead  . ED EKG  . ED EKG  . EKG 12-Lead  . EKG 12-Lead    ASSESSMENT AND PLAN:  Active Problems:   Sepsis ([place]) #1 Acute respiratory failure with hypoxia , -Hypoxia could be from cardiomyopathy with ejection fraction 15% and pulmonary hypertension Questionable due to shunting,Echocardiogram with 15% ejection fraction no obvious pulmonary embolus on VQ scan, continue oxygen therapy  despite good oxygenation, patient may benefit from right heart catheterization , although her cardiologist -Dr. Humphrey Rolls recommends conservative therapy only   #2. Dizziness, suspect due to pulmonary hypertension, continue oxygen therapy   #3 Acidosis,  unclear etiology, improved significantly, suspected shunting, ? intracardiac , , but low risk for pulmonary embolism on VQ scan  continue supportive therapy. Echocardiogram was performed during this admission and showed no intracardiac shunts. Cardiologist recommends conservative therapy, likely due to CKD  #4. Acute on chronic renal insufficiency, CKD stage 3, , improved with IV fluid administration #5. Hyperkalemia, resolved with blood alkalinization with citrate, now off citrate , receiving IV fluids with potassium supplementation, follow in the morning  #6. Elevated transaminases, due to rhabdomyolysis, which is improving with alkalinization, IV fluids #7. Marland Kitchen Leukocytosis, improved with therapy, now off antibiotics #8. Thrombocytopenia, stable, likely due to underlying rheumatologic disease, differential was performed and no schistocytes were found   . #9. Scleroderma with Raynaud syndrome, rheumatologist is recommending anticardiolipin antibodies, lupus anticoagulant, APTT and DIC panel. Hematology consult is recommended. Will order suggested labs and hematology consult is placed.APTT is normal but fibrin degradation products are above normal  Disposition skilled nursing facility as recommended by physical therapy when patient is clinically stable Management plans discussed with the patient, family and they are in agreement.   DRUG ALLERGIES:  Allergies  Allergen Reactions  . Other Itching and Other (See Comments)    Pt states that she is allergic to most narcotics.      CODE STATUS:     Code Status Orders        Start     Ordered   01/10/16 1829  Full code   Continuous     01/10/16 1829    Code Status History    Date Active Date Inactive Code Status Order ID Comments User Context   09/07/2015  9:36 AM 09/10/2015  6:23 PM Full Code [id number]  Hillary Bow, MD ED   06/12/2015  3:37 AM 06/15/2015  3:42 PM  Full Code [id number]  Rise Patience, MD Inpatient   06/12/2015 12:19 AM 06/12/2015  3:37 AM Full Code [id number]  Rise Patience, MD Inpatient    Advance Directive Documentation        Most Recent Value   Type of Advance Directive  Healthcare Power of Attorney, Living will   Pre-existing out of facility DNR order (yellow form or pink MOST form)     "MOST" Form in Place?        TOTAL Critical care TIME TAKING CARE OF THIS PATIENT:35 minutes.    Nicholes Mango M.D on 01/13/2016 at 10:30 AM  Between 7am to 6pm - Pager - [phone]  After 6pm go to [email or web address] - password [email or web address] [facility]  [place] [facility]  Office  [phone]  CC: Primary care physician; [facility], Chrissie Noa, MD

## 2016-01-14 ENCOUNTER — Ambulatory Visit (HOSPITAL_COMMUNITY)
Admission: AD | Admit: 2016-01-14 | Discharge: 2016-01-14 | Disposition: A | Discharge status: Home / Self Care | Payer: Medicare Other | Source: Other Acute Inpatient Hospital | Attending: Internal Medicine | Admitting: Internal Medicine

## 2016-01-14 ENCOUNTER — Inpatient Hospital Stay: Payer: Medicare Other

## 2016-01-14 DIAGNOSIS — I255 Ischemic cardiomyopathy: Secondary | ICD-10-CM

## 2016-01-14 DIAGNOSIS — C52 Malignant neoplasm of vagina: Secondary | ICD-10-CM | POA: Insufficient documentation

## 2016-01-14 LAB — GASTROINTESTINAL PANEL BY PCR, STOOL (REPLACES STOOL CULTURE)

## 2016-01-14 LAB — BASIC METABOLIC PANEL
Anion gap: 7 (ref 5–15)
BUN: 14 mg/dL (ref 6–20)
CHLORIDE: 108 mmol/L (ref 101–111)
CO2: 20 mmol/L — ABNORMAL LOW (ref 22–32)
CREATININE: 1.14 mg/dL — AB (ref 0.44–1.00)
Calcium: 7.3 mg/dL — ABNORMAL LOW (ref 8.9–10.3)
GFR calc non Af Amer: 49 mL/min — ABNORMAL LOW (ref >60)
GFR, EST AFRICAN AMERICAN: 57 mL/min — AB (ref >60)
Glucose, Bld: 110 mg/dL — ABNORMAL HIGH (ref 65–99)
POTASSIUM: 3.1 mmol/L — AB (ref 3.5–5.1)
SODIUM: 135 mmol/L (ref 135–145)

## 2016-01-14 LAB — C DIFFICILE QUICK SCREEN W PCR REFLEX
C Diff antigen: NEGATIVE
C Diff interpretation: NEGATIVE
C Diff toxin: NEGATIVE

## 2016-01-14 LAB — BLOOD GAS, ARTERIAL
ALLENS TEST (PASS/FAIL): POSITIVE — AB
Acid-base deficit: 5 mmol/L — ABNORMAL HIGH (ref 0.0–2.0)
Bicarbonate: 19.5 mEq/L — ABNORMAL LOW (ref 21.0–28.0)
FIO2: 0.28
O2 Saturation: 97 %
PCO2 ART: 33 mmHg (ref 32.0–48.0)
PH ART: 7.38 (ref 7.350–7.450)
Patient temperature: 37
pO2, Arterial: 92 mmHg (ref 83.0–108.0)

## 2016-01-14 LAB — OCCULT BLOOD X 1 CARD TO LAB, STOOL: Fecal Occult Bld: POSITIVE — AB

## 2016-01-14 LAB — TROPONIN I
TROPONIN I: 0.68 ng/mL — AB (ref <0.031)
Troponin I: 0.72 ng/mL — ABNORMAL HIGH (ref <0.031)
Troponin I: 0.63 ng/mL — ABNORMAL HIGH (ref <0.031)

## 2016-01-14 LAB — POTASSIUM: POTASSIUM: 3.5 mmol/L (ref 3.5–5.1)

## 2016-01-14 LAB — MAGNESIUM: MAGNESIUM: 2 mg/dL (ref 1.7–2.4)

## 2016-01-14 LAB — PHOSPHORUS: PHOSPHORUS: 2.7 mg/dL (ref 2.5–4.6)

## 2016-01-14 MED ORDER — ACETAMINOPHEN 325 MG PO TABS
650.0000 mg | ORAL_TABLET | ORAL | Status: DC | PRN
Start: 2016-01-14 — End: 2016-01-15
  Administered 2016-01-14: 650 mg via ORAL
  Filled 2016-01-14: qty 2

## 2016-01-14 MED ORDER — POTASSIUM CHLORIDE CRYS ER 20 MEQ PO TBCR
20.0000 meq | EXTENDED_RELEASE_TABLET | Freq: Once | ORAL | Status: AC
  Administered 2016-01-14: 20 meq via ORAL
  Filled 2016-01-14: qty 1

## 2016-01-14 MED ORDER — OXYCODONE HCL 5 MG PO TABS
5.0000 mg | ORAL_TABLET | ORAL | Status: AC | PRN
Start: 2016-01-14 — End: ?

## 2016-01-14 MED ORDER — CARVEDILOL 12.5 MG PO TABS: 12.5000 mg | ORAL_TABLET | Freq: Two times a day (BID) | ORAL | Status: AC

## 2016-01-14 MED ORDER — POTASSIUM CHLORIDE IN NACL 20-0.9 MEQ/L-% IV SOLN: 50.0000 mL/h | INTRAVENOUS | Status: AC

## 2016-01-14 MED ORDER — POTASSIUM & SODIUM PHOSPHATES 280-160-250 MG PO PACK: 1.0000 | PACK | Freq: Three times a day (TID) | ORAL | Status: AC

## 2016-01-14 MED ORDER — POTASSIUM CHLORIDE 20 MEQ PO PACK: 40.0000 meq | PACK | Freq: Once | ORAL | Status: DC

## 2016-01-14 MED ORDER — SACUBITRIL-VALSARTAN 24-26 MG PO TABS: 1.0000 | ORAL_TABLET | Freq: Two times a day (BID) | ORAL | Status: AC

## 2016-01-14 MED ORDER — SODIUM CHLORIDE 0.9 % IV BOLUS (SEPSIS)
250.0000 mL | Freq: Once | INTRAVENOUS | Status: AC
  Administered 2016-01-14: 250 mL via INTRAVENOUS

## 2016-01-14 NOTE — Progress Notes (Signed)
PHARMACY CONSULT NOTE - Follow up  Pharmacy Consult for Electrolyte Monitoring Indication: Hypokalemia  Allergies  Allergen Reactions  . Other Itching and Other (See Comments)    Pt states that she is allergic to most narcotics.      Patient Measurements: Height: 5' (152.4 cm) Weight: 110 lb 3.7 oz (50 kg) IBW/kg (Calculated) : 45.5  Vital Signs: Temp: 98 F (36.7 C) (05/07 0737) Temp Source: Oral (05/07 0737) BP: 110/74 mmHg (05/07 0737) Pulse Rate: 66 (05/07 0737) Intake/Output from previous day: 05/06 0701 - 05/07 0700 In: 1980 [P.O.:940; I.V.:1040] Out: 300 [Urine:300] Intake/Output from this shift: Total I/O In: 68 [I.V.:68] Out: -   Labs:  Recent Labs  01/11/16 1608 01/12/16 0436 01/12/16 1956 01/13/16 0542 01/13/16 1451  WBC  --  16.1*  --  12.5*  --   HGB  --  11.2*  --  13.1  --   HCT  --  35.2  --  41.7  --   PLT  --  88*  --  83*  --   APTT  --   --  28  --  <24*  INR 2.00  --   --   --   --      Recent Labs  01/11/16 2023 01/12/16 0436  01/12/16 2225 01/13/16 0542 01/14/16 0343  NA 136  --   < > 131* 132* 135  K 3.6  --   < > 3.3* 3.8 3.1*  CL 105  --   < > 102 102 108  CO2 17*  --   < > 17* 19* 20*  GLUCOSE 132*  --   < > 131* 155* 110*  BUN 21*  --   < > 16 14 14   CREATININE 1.41*  --   < > 1.09* 0.94 1.14*  CALCIUM 7.2*  --   < > 7.4* 7.8* 7.3*  MG  --  1.5*  --   --  2.6* 2.0  PHOS  --  2.3*  --   --  1.9* 2.7  PROT 4.9*  --   --  5.0*  --   --   ALBUMIN 2.1*  --   --  2.1*  --   --   AST 261*  --   --  101*  --   --   ALT 213*  --   --  150*  --   --   ALKPHOS 149*  --   --  155*  --   --   BILITOT 1.3*  --   --  1.0  --   --   < > = values in this interval not displayed. Estimated Creatinine Clearance: 35.3 mL/min (by C-G formula based on Cr of 1.14).   No results for input(s): GLUCAP in the last 72 hours.  Medical History: Past Medical History  Diagnosis Date  . CHF (congestive heart failure) ([place])   . Hypertension    . Coronary artery disease   . Renal insufficiency   . Scleroderma ([place])   . Chronic back pain   . GERD (gastroesophageal reflux disease)   . Blue skin   . COPD (chronic obstructive pulmonary disease) ([place])   . Raynaud disease   . Shortness of breath dyspnea   . Anginal pain ([place])   . Edema   . Hyperlipemia   . Ovarian cancer ([place])     chemo/rad  . 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 Vulvar cancer ([place])     Medications:  Scheduled:  . amitriptyline  25 mg Oral QHS  . antiseptic oral rinse  7 mL Mouth Rinse BID  . aspirin EC  81 mg Oral Daily  . carvedilol  12.5 mg Oral BID WC  . clopidogrel  75 mg Oral Daily  . fenofibrate  160 mg Oral Daily  . potassium & sodium phosphates  1 packet Oral TID WC & HS  . potassium chloride  20 mEq Oral Once  . sacubitril-valsartan  1 tablet Oral BID  . sodium chloride flush  3 mL Intravenous Q12H   Infusions:  . 0.9 % NaCl with KCl 20 mEq / L 40 mL/hr at 01/12/16 1429    Assessment: Pharmacy consulted to assist in managing electrolytes in this 65 y/o F with hypokalemia.   Plan:  Magnesium 4 g iv once. Patient on phos-nak tabs. Will give potassium chloride 20 meq po x 2 and f/u am labs.  5/6:  K=3.8 , Mag=2.6, Phos= 1.9.  Patient currently on NS w/ KCL 72meq/L at 40 ml/hr and Phos-NAK packet w/meals and bedtime. Will continue current therapy as patient had only received 2 doses of Phos-NAK so far. F/u w/ am labs.  5/7:  K=3.1, Mag=2.0, Phos=2.7. Patient currently on NS w/ KCL 23meq/L at 40 ml/hr and Phos-NAK packet w/meals and bedtime. Will give KCL 76meq po x 1 and recheck at 1600. Patient started on Entresto which can increase Potassium.  F/U with am labs.   Yeraldi Fidler A 01/14/2016,8:16 AM

## 2016-01-14 NOTE — Progress Notes (Signed)
[facility] [place] NAME: Cheryl Hamilton    MR#:  [id number]  DATE OF BIRTH:  31-Aug-1951  SUBJECTIVE:  CHIEF COMPLAINT:   Chief Complaint  Patient presents with  . Fatigue  Patient is 65 year old Caucasian female with past medical history significant for vulvar cancer, scleroderma, who presents to the hospital with complaints of dizziness, especially whenever she stands up, shortness of breath. On arrival to the hospital patient was noted to be tachycardic, acidotic. Labs revealed chronic renal insufficiency, although worsened in the past one month, hyperkalemia, elevated transaminases. Lactic acid level of 9.0. At its peak, elevated troponin, leukocytosis, thrombocytopenia. Due to concerns of infection/sepsis. Patient was initiated on broad-spectrum antibiotic therapy, blood cultures were negative, MRSA screen was negative, urine culture was negative. Patient is off antibiotics now. Lactic acid level improved with hydration, oxygenation, patient's oxygen saturations remained stable. No obvious infection noted, no fever. Patient complains of pain in the fingertips Today patient is resting comfortably during my examination. Intermittent episodes of confusion Unable to get good history from the patient   . ROS   Unobtainable as the patient is confused pleasantly  VITAL SIGNS: Blood pressure 90/50, pulse 72, temperature 97.8 F (36.6 C), temperature source Oral, resp. rate 16, height 5' (1.524 m), weight 50 kg (110 lb 3.7 oz), SpO2 97 %.  PHYSICAL EXAMINATION:   GENERAL:  65 y.o.-year-old patient lying in the no significant distress, remains pale, gray discoloration of face,. Cyanotic EYES: Pupils equal, round, reactive to light and accommodation. No scleral icterus. Extraocular muscles intact.  HEENT: Head atraumatic, normocephalic. Oropharynx and nasopharynx clear.  NECK:  Supple, no jugular venous distention. No thyroid enlargement, no  tenderness.  LUNGS: Moderate breath sounds bilaterally, no wheezing, rales,rhonchi or crepitations, not using respiratory muscles, unless with longer sentences  CARDIOVASCULAR: S1, S2 , tachycardic.  No murmurs, rubs, or gallops.  ABDOMEN: Soft, nontender, nondistended. Bowel sounds present. No organomegaly or mass.  EXTREMITIES: No pedal edema, bilateral upper extremity and lower extremity cyanosis, and clubbing.  NEUROLOGIC: Pleasantly confused  PSYCHIATRIC: The patient is alert , pleasantly confused SKIN: No obvious rash, lesion, or ulcer. Ulcerations in fingertips, blue discoloration, but skin is warm    ORDERS/RESULTS REVIEWED:   CBC  Recent Labs Lab 01/10/16 1541 01/11/16 0340 01/11/16 1608 01/12/16 0436 01/13/16 0542  WBC 14.3* 20.0*  --  16.1* 12.5*  HGB 11.8* 10.6*  --  11.2* 13.1  HCT 37.8 35.1  --  35.2 41.7  PLT 113* 89*  --  88* 83*  MCV 87.7 90.4  --  86.8 86.8  MCH 27.4 27.3  --  27.6 27.3  MCHC 31.2* 30.2*  --  31.9* 31.5*  RDW 26.6* 26.1*  --  26.0* 25.9*  LYMPHSABS  --   --  1.6  --   --   MONOABS  --   --  0.2  --   --   EOSABS  --   --  0.0  --   --   BASOSABS  --   --  0.0  --   --    ------------------------------------------------------------------------------------------------------------------  Chemistries   Recent Labs Lab 01/10/16 1541  01/11/16 0340  01/11/16 2023 01/12/16 0436 01/12/16 1045 01/12/16 2225 01/13/16 0542 01/14/16 0343  NA 131*  --  135  --  136  --  131* 131* 132* 135  K 6.5*  --  5.4*  < > 3.6  --  3.1* 3.3* 3.8 3.1*  CL 102  --  107  --  105  --  103 102 102 108  CO2 11*  --  10*  --  17*  --  16* 17* 19* 20*  GLUCOSE 152*  --  91  --  132*  --  136* 131* 155* 110*  BUN 25*  --  23*  --  21*  --  17 16 14 14   CREATININE 1.91*  < > 1.51*  --  1.41*  --  1.10* 1.09* 0.94 1.14*  CALCIUM 8.3*  --  8.0*  --  7.2*  --  7.1* 7.4* 7.8* 7.3*  MG 1.9  --   --   --   --  1.5*  --   --  2.6* 2.0  AST  --   --  425*  --  261*   --   --  101*  --   --   ALT  --   --  227*  --  213*  --   --  150*  --   --   ALKPHOS  --   --  189*  --  149*  --   --  155*  --   --   BILITOT  --   --  1.6*  --  1.3*  --   --  1.0  --   --   < > = values in this interval not displayed. ------------------------------------------------------------------------------------------------------------------ estimated creatinine clearance is 35.3 mL/min (by C-G formula based on Cr of 1.14). ------------------------------------------------------------------------------------------------------------------ No results for input(s): TSH, T4TOTAL, T3FREE, THYROIDAB in the last 72 hours.  Invalid input(s): FREET3  Cardiac Enzymes  Recent Labs Lab 01/11/16 0029 01/11/16 0340 01/14/16 1043  TROPONINI 1.34* 1.57* 0.72*   ------------------------------------------------------------------------------------------------------------------ Invalid input(s): POCBNP ---------------------------------------------------------------------------------------------------------------  RADIOLOGY: Dg Chest Port 1 View  01/14/2016  CLINICAL DATA:  Sepsis and confusion. Hypotension. History of Raynaud disease and scleroderma. EXAM: PORTABLE CHEST 1 VIEW COMPARISON:  01/10/2016 FINDINGS: Cardiac enlargement. No pulmonary vascular congestion. There are small bilateral pleural effusions with basilar infiltration which could indicate edema or atelectasis. This is progressing since previous study. No pneumothorax. Bilateral rib fractures and bilateral clavicular fractures appear to be a different ages. Some may be acute. IMPRESSION: Cardiac enlargement. Developing bilateral pleural effusions and basilar atelectasis or edema. Electronically Signed   By: Lucienne Capers M.D.   On: 01/14/2016 04:29    EKG:  Orders placed or performed during the hospital encounter of 01/10/16  . EKG 12-Lead  . EKG 12-Lead  . ED EKG  . ED EKG  . EKG 12-Lead  . EKG 12-Lead  . EKG 12-Lead   . EKG 12-Lead    ASSESSMENT AND PLAN:  Active Problems:   Sepsis ([place]) #1 Acute respiratory failure with hypoxia , -Hypoxia could be from cardiomyopathy with ejection fraction 15% and pulmonary hypertensionWorsened with underlying scleroderma Echocardiogram with 15% ejection fraction no obvious pulmonary embolus on VQ scan, continue oxygen therapy  despite good oxygenation,  Discussed with patient's son healthcare power of attorney Mr. Mat, who is not considering cardiac catheterization or endocardial biopsy as recommended by Dr. Humphrey Rolls , continue conservative therapy only , ideally speaking patient should be getting Coreg, entresto and Aldactone but they are on hold in view of hypotension  #2. Dizziness, suspect due to pulmonary hypertension, continue oxygen therapy   #3 Acidosis,  unclear etiology, improved significantly, suspected shunting, ? intracardiac , , but low risk for pulmonary embolism on VQ scan  continue supportive therapy. Echocardiogram  was performed during this admission and showed no intracardiac shunts. Cardiologist recommends conservative therapy   #4. Acute on chronic renal insufficiency, CKD stage 3, , improved with IV fluid administration  #5. Hyperkalemia, resolved with blood alkalinization with citrate, now off citrate , receiving IV fluids with potassium supplementation, follow in the morning   #6. Elevated transaminases, due to rhabdomyolysis, which is improving with alkalinization, IV fluids  #7. Marland Kitchen Leukocytosis, improved with therapy, now off antibiotics  #8. Thrombocytopenia, stable, likely due to underlying rheumatologic disease, differential was performed and no schistocytes were found   . #9. Scleroderma with Raynaud syndrome, rheumatologist is recommending anticardiolipin antibodies, lupus anticoagulant, APTT and DIC panel. 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 Dr. Mike Gip on her oncologist is recommended to transfer the patient to [facility] for tertiary care  Palliative care consult is placed as the patient has a very poor prognosis and high risk for morbidity and mortality  Patient is accepted by Dr. Gertie Exon at [facility] bed availability   Management plans discussed with the patient's son Mr. Mat  [phone], via phone , had a lengthy discussion with him .he is aware that her clinical condition is deteriorating and changed her CODE STATUS to DO NOT RESUSCITATE and not considering cardiac catheterization or endocardial biopsy as requested by cardiology as per his mom's wishes .agreeable with [facility] transfer and palliative care consult at [place]:  Allergies  Allergen Reactions  . Other Itching and Other (See Comments)    Pt states that she is allergic to most narcotics.      CODE STATUS:     Code Status Orders        Start     Ordered   01/10/16 1829  DNR  Continuous     01/10/16 1829    Code Status History    Date Active Date Inactive Code Status Order ID Comments User Context   09/07/2015  9:36 AM 09/10/2015  6:23 PM Full Code [id number]  Hillary Bow, MD ED   06/12/2015  3:37 AM 06/15/2015  3:42 PM Full Code [id number]  Rise Patience, MD Inpatient   06/12/2015 12:19 AM 06/12/2015  3:37 AM Full Code [id number]  Rise Patience, MD Inpatient    Advance Directive Documentation        Most Recent Value   Type of Advance Directive  Healthcare Power of [place], Living will   Pre-existing out of facility DNR order (yellow form or pink MOST form)     "MOST" Form in Place?        TOTAL Critical care TIME TAKING CARE OF THIS PATIENT:45 minutes.    Nicholes Mango M.D on 01/14/2016 at 2:32 PM  Between 7am to 6pm - Pager - [phone]  After 6pm go to [email or web address] - password [email or web address]  [place]  Office  [phone]  CC: Primary care physician; [facility], Chrissie Noa, MD

## 2016-01-14 NOTE — Progress Notes (Signed)
rn spoke to dr gouru re: run of svt macular degeneration orders ekg. rn also informed md that per chart,dr corcororan may be suggesting  Pt may need tertiary care

## 2016-01-14 NOTE — Discharge Summary (Signed)
[facility] [place] NAME: Cheryl Hamilton    MR#:  [id number]  DATE OF BIRTH:  [birth year]  DATE OF ADMISSION:  01/10/2016 ADMITTING PHYSICIAN: Dustin Flock, MD  DATE OF DISCHARGE: 01/14/16  PRIMARY CARE PHYSICIAN: FELDPAUSCH, DALE E, MD    ADMISSION DIAGNOSIS:  Hyperkalemia [E87.5] Lactic acidosis [E87.2] SOB (shortness of breath) [R06.02] Cardiac ischemia [I25.9] Elevated troponin I level [R79.89] Sepsis, due to unspecified organism ([place]) [A41.9] Acute renal failure, unspecified acute renal failure type ([place]) [N17.9]  DISCHARGE DIAGNOSIS:  AMS Elevated troponin Cardiomyopathy Scleroderma Vaginal cancer  SECONDARY DIAGNOSIS:   Past Medical History  Diagnosis Date  . CHF (congestive heart failure) ([place])   . Hypertension   . Coronary artery disease   . Renal insufficiency   . Scleroderma ([place])   . Chronic back pain   . GERD (gastroesophageal reflux disease)   . Blue skin   . COPD (chronic obstructive pulmonary disease) ([place])   . Raynaud disease   . Shortness of breath dyspnea   . Anginal pain ([place])   . Edema   . Hyperlipemia   . Ovarian cancer ([place])     chemo/rad  . Vaginal cancer ([place])   . Vulvar cancer [facility])     HOSPITAL COURSE:   #1 Acute respiratory failure with hypoxia , -Hypoxia could be from cardiomyopathy with ejection fraction 15% and pulmonary hypertensionWorsened with underlying scleroderma Echocardiogram with 15% ejection fraction no obvious pulmonary embolus on VQ scan, continue oxygen therapy despite good oxygenation, on 3 lit of o2 Discussed with patient's son healthcare power of attorney Mr. Mat, who is not considering cardiac catheterization or endocardial biopsy as recommended by Dr. Humphrey Rolls , acc to his moms wish, will continue conservative therapy only , ideally speaking patient should be getting Coreg, entresto and Aldactone but they are on hold in view of hypotension  #2. Dizziness, suspect  due to pulmonary hypertension, continue oxygen therapy   #3 Acidosis, unclear etiology, improved significantly, suspected shunting, ? intracardiac , , but low risk for pulmonary embolism on VQ scan continue supportive therapy. Echocardiogram was performed during this admission and showed no intracardiac shunts. Cardiologist recommends conservative therapy   #4. Acute on chronic renal insufficiency, CKD stage 3, , improved with IV fluid administration  #5. Hyperkalemia, resolved with blood alkalinization with citrate, now off citrate , receiving IV fluids with potassium supplementation, follow in the morning  #6. Elevated transaminases, due to rhabdomyolysis, which is improved with alkalinization, IV fluids  #7. Marland Kitchen Leukocytosis, improved with therapy, now off antibiotics  #8. Thrombocytopenia, stable, likely due to underlying rheumatologic disease, differential was performed and no schistocytes were found   . #9. Scleroderma with Raynaud syndrome, rheumatologist is recommending anticardiolipin antibodies, lupus anticoagulant, APTT and DIC panel. Hematology consult appreciated APTT is normal but fibrin degradation products are above normal  #10 history of vaginal cancer: sees [facility], surgery was scheduled for last week was canceled as patient is at [facility]. Dr. Mike Gip on her oncologist is recommended to transfer the patient to [facility] for tertiary care  Palliative care consult is placed as the patient has a very poor prognosis and high risk for morbidity and mortality  Patient is accepted by Dr. Gertie Exon at [facility]. Transfer to [facility] when bed is available   Management plans discussed with the patient's son Mr. Mat [phone], via phone , had a lengthy discussion with him .he is aware that her clinical condition is deteriorating  and changed her CODE STATUS to DO NOT RESUSCITATE and not considering cardiac catheterization or endocardial biopsy as requested  by cardiology as per his mom's wishes .agreeable with [facility] transfer   DISCHARGE CONDITIONS:   gaurded  CONSULTS OBTAINED:  Treatment Team:  Laverle Hobby, MD Dionisio David, MD Lequita Asal, MD   PROCEDURES none  DRUG ALLERGIES:   Allergies  Allergen Reactions  . Other Itching and Other (See Comments)    Pt states that she is allergic to most narcotics.      DISCHARGE MEDICATIONS:   Current Discharge Medication List    START taking these medications   Details  carvedilol (COREG) 12.5 MG tablet Take 1 tablet (12.5 mg total) by mouth 2 (two) times daily with a meal.    oxyCODONE (OXY IR/ROXICODONE) 5 MG immediate release tablet Take 1 tablet (5 mg total) by mouth every 4 (four) hours as needed for severe pain. Qty: 30 tablet, Refills: 0    potassium & sodium phosphates (PHOS-NAK) 280-160-250 MG PACK Take 1 packet by mouth 4 (four) times daily -  with meals and at bedtime. Qty: 120 packet    Potassium Chloride in NaCl (0.9 % NACL WITH KCL 20 MEQ / L) 20-0.9 MEQ/L-% Inject 50 mL/hr into the vein continuous.    sacubitril-valsartan (ENTRESTO) 24-26 MG Take 1 tablet by mouth 2 (two) times daily. Qty: 60 tablet      CONTINUE these medications which have NOT CHANGED   Details  albuterol (PROVENTIL HFA;VENTOLIN HFA) 108 (90 Base) MCG/ACT inhaler Inhale 2 puffs into the lungs every 6 (six) hours as needed for wheezing or shortness of breath.    ALPRAZolam (XANAX) 0.25 MG tablet Take 1 tablet (0.25 mg total) by mouth 3 (three) times daily as needed for anxiety. Qty: 24 tablet, Refills: 0    amitriptyline (ELAVIL) 25 MG tablet Take 25 mg by mouth at bedtime.    aspirin EC 81 MG tablet Take 81 mg by mouth daily.    clopidogrel (PLAVIX) 75 MG tablet Take 75 mg by mouth daily.    fenofibrate (TRICOR) 145 MG tablet Take 145 mg by mouth daily.    furosemide (LASIX) 20 MG tablet Take 20 mg by mouth 2 (two) times daily.    simvastatin (ZOCOR) 20 MG tablet Take 20  mg by mouth at bedtime.    spironolactone (ALDACTONE) 25 MG tablet Take 12.5 mg by mouth daily.    traMADol (ULTRAM) 50 MG tablet Take 50 mg by mouth every 6 (six) hours as needed for moderate pain.      STOP taking these medications     caffeine 200 MG TABS tablet      metoprolol succinate (TOPROL-XL) 50 MG 24 hr tablet      potassium chloride (MICRO-K) 10 MEQ CR capsule          DISCHARGE INSTRUCTIONS:   Transfer pt to [facility] when bed is available  DIET:  cardiac  DISCHARGE CONDITION:  Fair but gaurded  ACTIVITY:  As tolerated  OXYGEN:  Home Oxygen: yes   Oxygen Delivery: 3 lit [place]  DISCHARGE LOCATION:  Transfer to [facility]  If you experience worsening of your admission symptoms, develop shortness of breath, life threatening emergency, suicidal or homicidal thoughts you must seek medical attention immediately by calling 911 or calling your MD immediately  if symptoms less severe.  You Must read complete instructions/literature along with all the possible adverse reactions/side effects for all the Medicines you take and that have  been prescribed to you. Take any new Medicines after you have completely understood and accpet all the possible adverse reactions/side effects.   Please note  You were cared for by a hospitalist during your hospital stay. If you have any questions about your discharge medications or the care you received while you were in the hospital after you are discharged, you can call the unit and asked to speak with the hospitalist on call if the hospitalist that took care of you is not available. Once you are discharged, your primary care physician will handle any further medical issues. Please note that NO REFILLS for any discharge medications will be authorized once you are discharged, as it is imperative that you return to your primary care physician (or establish a relationship with a primary care physician if you do not have one) for your aftercare needs  so that they can reassess your need for medications and monitor your lab values.     Today  Chief Complaint  Patient presents with  . Fatigue   Pt is pleasantly confused  ROS: unobtainable 2/2 ams    VITAL SIGNS:  Blood pressure 106/56, pulse 78, temperature 97.8 F (36.6 C), temperature source Axillary, resp. rate 16, height 5' (1.524 m), weight 50 kg (110 lb 3.7 oz), SpO2 100 %.  I/O:    Intake/Output Summary (Last 24 hours) at 01/14/16 1815 Last data filed at 01/14/16 1742  Gross per 24 hour  Intake   1892 ml  Output      0 ml  Net   1892 ml    PHYSICAL EXAMINATION:  GENERAL:  64 y.o.-year-old patient lying in the bed with no acute distress.  EYES: Pupils equal, round, reactive to light and accommodation. No scleral icterus. Extraocular muscles intact.  HEENT: Head atraumatic, normocephalic. Oropharynx and nasopharynx clear.  NECK:  Supple, no jugular venous distention. No thyroid enlargement, no tenderness.  LUNGS: Mod breath sounds bilaterally, no wheezing, rales,rhonchi or crepitation. No use of accessory muscles of respiration.  CARDIOVASCULAR: S1, S2 normal. No murmurs, rubs, or gallops.  ABDOMEN: Soft, non-tender, non-distended. Bowel sounds present. No organomegaly or mass.  EXTREMITIES: No pedal edema, cyanosis, or clubbing.  NEUROLOGIC: awake but altered  PSYCHIATRIC: The patient is alert but altered  SKIN: No obvious rash, lesion, or ulcer. Dusky and cyanotic  DATA REVIEW:   CBC  Recent Labs Lab 01/13/16 0542  WBC 12.5*  HGB 13.1  HCT 41.7  PLT 83*    Chemistries   Recent Labs Lab 01/12/16 2225  01/14/16 0343 01/14/16 1600  NA 131*  < > 135  --   K 3.3*  < > 3.1* 3.5  CL 102  < > 108  --   CO2 17*  < > 20*  --   GLUCOSE 131*  < > 110*  --   BUN 16  < > 14  --   CREATININE 1.09*  < > 1.14*  --   CALCIUM 7.4*  < > 7.3*  --   MG  --   < > 2.0  --   AST 101*  --   --   --   ALT 150*  --   --   --   ALKPHOS 155*  --   --   --    BILITOT 1.0  --   --   --   < > = values in this interval not displayed.  Cardiac Enzymes  Recent Labs Lab 01/14/16 1600  TROPONINI 0.68*  Microbiology Results  Results for orders placed or performed during the hospital encounter of 01/10/16  Blood Culture (routine x 2)     Status: None (Preliminary result)   Collection Time: 01/10/16  4:16 PM  Result Value Ref Range Status   Specimen Description BLOOD LEFT ASSIST CONTROL  Final   Special Requests BOTTLES DRAWN AEROBIC AND ANAEROBIC [place]  Final   Culture NO GROWTH 4 DAYS  Final   Report Status PENDING  Incomplete  Blood Culture (routine x 2)     Status: None (Preliminary result)   Collection Time: 01/10/16  4:16 PM  Result Value Ref Range Status   Specimen Description BLOOD RIGHT ASSIST CONTROL  Final   Special Requests BOTTLES DRAWN AEROBIC AND ANAEROBIC [place]  Final   Culture NO GROWTH 4 DAYS  Final   Report Status PENDING  Incomplete  MRSA PCR Screening     Status: None   Collection Time: 01/10/16  8:20 PM  Result Value Ref Range Status   MRSA by PCR NEGATIVE NEGATIVE Final    Comment:        The GeneXpert MRSA Assay (FDA approved for NASAL specimens only), is one component of a comprehensive MRSA colonization surveillance program. It is not intended to diagnose MRSA infection nor to guide or monitor treatment for MRSA infections.   Urine culture     Status: None   Collection Time: 01/11/16  8:16 PM  Result Value Ref Range Status   Specimen Description URINE, RANDOM  Final   Special Requests Immunocompromised  Final   Culture NO GROWTH 2 DAYS  Final   Report Status 01/13/2016 FINAL  Final    RADIOLOGY:  Nm Pulmonary Perf And Vent  01/11/2016  CLINICAL DATA:  Shortness of Breath EXAM: NUCLEAR MEDICINE VENTILATION - PERFUSION LUNG SCAN Views: Anterior, posterior, left lateral, right lateral, RPO, LPO, RAO, LAO -ventilation and perfusion RADIOPHARMACEUTICALS:  35.161 mCi Technetium-68m DTPA  aerosol inhalation and 4.269 mCi Technetium-84m MAA IV COMPARISON:  Chest radiograph Jan 10, 2016 FINDINGS: Ventilation: There are a few scattered subsegmental foci of decreased attenuation. There is no segmental ventilation defect on either side. Perfusion: There are a few matching subsegmental lower lobe perfusion defects. There is no appreciable ventilation/perfusion mismatch, and there are no perfusion defects at a segmental or nearly segmental level. Heart is noted to be prominent. IMPRESSION: No appreciable ventilation/ perfusion mismatch. Small subsegmental matching ventilation and perfusion defects. These findings constitute a low probability of pulmonary embolus. Electronically Signed   By: Lowella Grip III M.D.   On: 01/11/2016 14:22   Dg Chest Port 1 View  01/14/2016  CLINICAL DATA:  Sepsis and confusion. Hypotension. History of Raynaud disease and scleroderma. EXAM: PORTABLE CHEST 1 VIEW COMPARISON:  01/10/2016 FINDINGS: Cardiac enlargement. No pulmonary vascular congestion. There are small bilateral pleural effusions with basilar infiltration which could indicate edema or atelectasis. This is progressing since previous study. No pneumothorax. Bilateral rib fractures and bilateral clavicular fractures appear to be a different ages. Some may be acute. IMPRESSION: Cardiac enlargement. Developing bilateral pleural effusions and basilar atelectasis or edema. Electronically Signed   By: Lucienne Capers M.D.   On: 01/14/2016 04:29   US Abdomen Limited Ruq  01/12/2016  CLINICAL DATA:  Elevated transaminase levels. EXAM: US ABDOMEN LIMITED - RIGHT UPPER QUADRANT COMPARISON:  Abdominal and pelvic CT scan of March 23 2012. FINDINGS: Bowel gas limits evaluation of some of the liver images. Gallbladder: The gallbladder is adequately distended. There is a small amount  of echogenic sludge or bile present. No stones are evident. There is no gallbladder wall thickening, pericholecystic fluid, or positive  sonographic Murphy's sign. Common bile duct: Diameter: 2.9 mm Liver: The hepatic echotexture is normal. There is no intrahepatic ductal dilation. The surface contour of the liver is normal. However, there is small amount of ascites surrounding the liver. Incidental note is made of a small right kidney measuring 8.9 cm in length. IMPRESSION: 1. Small amount of sludge within the gallbladder but no discrete stones are observed. Further evaluation with a nuclear medicine hepatobiliary scan may be useful if gallbladder dysfunction is suspected clinically. 2. No ultrasonic abnormality of the visualized portions of the liver or common bile duct. However, there is a small amount of ascites surrounding the liver. 3. Small right kidney without evidence of obstruction. Electronically Signed   By: David  Martinique M.D.   On: 01/12/2016 09:24    EKG:   Orders placed or performed during the hospital encounter of 01/10/16  . EKG 12-Lead  . EKG 12-Lead  . ED EKG  . ED EKG  . EKG 12-Lead  . EKG 12-Lead  . EKG 12-Lead  . EKG 12-Lead      Management plans discussed with the patient, son and they are in agreement.  son agreeable for [facility] transfer  CODE STATUS:     Code Status Orders        Start     Ordered   01/14/16 1055  Do not attempt resuscitation (DNR)   Continuous    Question Answer Comment  In the event of cardiac or respiratory ARREST Do not call a "code blue"   In the event of cardiac or respiratory ARREST Do not perform Intubation, CPR, defibrillation or ACLS   In the event of cardiac or respiratory ARREST Use medication by any route, position, wound care, and other measures to relive pain and suffering. May use oxygen, suction and manual treatment of airway obstruction as needed for comfort.   Comments RN may pronounce      01/14/16 1055    Code Status History    Date Active Date Inactive Code Status Order ID Comments User Context   01/10/2016  6:29 PM 01/14/2016 10:54 AM Full Code [id number]   Dustin Flock, MD ED   09/07/2015  9:36 AM 09/10/2015  6:23 PM Full Code [id number]  Hillary Bow, MD ED   06/12/2015  3:37 AM 06/15/2015  3:42 PM Full Code [id number]  Rise Patience, MD Inpatient   06/12/2015 12:19 AM 06/12/2015  3:37 AM Full Code [id number]  Rise Patience, MD Inpatient    Advance Directive Documentation        Most Recent Value   Type of Advance Directive  Healthcare Power of [place], Living will   Pre-existing out of facility DNR order (yellow form or pink MOST form)     "MOST" Form in Place?        TOTAL TIME TAKING CARE OF THIS PATIENT: 45 minutes.    @[facility] @  on 01/14/2016 at 6:15 PM  Between 7am to 6pm - Pager - [phone]  After 6pm go to [email or web address] - password [email or web address] [facility]  [facility]  Office  [phone]  CC: Primary care physician; [facility], Chrissie Noa, MD

## 2016-01-14 NOTE — Progress Notes (Signed)
PHARMACY CONSULT NOTE - Follow up  Pharmacy Consult for Electrolyte Monitoring Indication: Hypokalemia  Allergies  Allergen Reactions  . Other Itching and Other (See Comments)    Pt states that she is allergic to most narcotics.     Patient Measurements: Height: 5' (152.4 cm) Weight: 110 lb 3.7 oz (50 kg) IBW/kg (Calculated) : 45.5  Vital Signs: Temp: 97.8 F (36.6 C) (05/07 1536) Temp Source: Axillary (05/07 1536) BP: 106/56 mmHg (05/07 1536) Pulse Rate: 78 (05/07 1536) Intake/Output from previous day: 05/06 0701 - 05/07 0700 In: 1980 [P.O.:940; I.V.:1040] Out: 300 [Urine:300] Intake/Output from this shift:    Labs:  Recent Labs  01/12/16 0436 01/12/16 1956 01/13/16 0542 01/13/16 1451  WBC 16.1*  --  12.5*  --   HGB 11.2*  --  13.1  --   HCT 35.2  --  41.7  --   PLT 88*  --  83*  --   APTT  --  28  --  <24*     Recent Labs  01/11/16 2023 01/12/16 0436  01/12/16 2225 01/13/16 0542 01/14/16 0343 01/14/16 1600  NA 136  --   < > 131* 132* 135  --   K 3.6  --   < > 3.3* 3.8 3.1* 3.5  CL 105  --   < > 102 102 108  --   CO2 17*  --   < > 17* 19* 20*  --   GLUCOSE 132*  --   < > 131* 155* 110*  --   BUN 21*  --   < > 16 14 14   --   CREATININE 1.41*  --   < > 1.09* 0.94 1.14*  --   CALCIUM 7.2*  --   < > 7.4* 7.8* 7.3*  --   MG  --  1.5*  --   --  2.6* 2.0  --   PHOS  --  2.3*  --   --  1.9* 2.7  --   PROT 4.9*  --   --  5.0*  --   --   --   ALBUMIN 2.1*  --   --  2.1*  --   --   --   AST 261*  --   --  101*  --   --   --   ALT 213*  --   --  150*  --   --   --   ALKPHOS 149*  --   --  155*  --   --   --   BILITOT 1.3*  --   --  1.0  --   --   --   < > = values in this interval not displayed. Estimated Creatinine Clearance: 35.3 mL/min (by C-G formula based on Cr of 1.14).   No results for input(s): GLUCAP in the last 72 hours.  Medical History: Past Medical History  Diagnosis Date  . CHF (congestive heart failure) ([place])   . Hypertension   .  Coronary artery disease   . Renal insufficiency   . Scleroderma ([place])   . Chronic back pain   . GERD (gastroesophageal reflux disease)   . Blue skin   . COPD (chronic obstructive pulmonary disease) ([place])   . Raynaud disease   . Shortness of breath dyspnea   . Anginal pain ([place])   . Edema   . Hyperlipemia   . Ovarian cancer ([place])     chemo/rad  . Vaginal cancer ([place])   .  Vulvar cancer ([place])     Medications:  Scheduled:  . amitriptyline  25 mg Oral QHS  . antiseptic oral rinse  7 mL Mouth Rinse BID  . aspirin EC  81 mg Oral Daily  . carvedilol  12.5 mg Oral BID WC  . clopidogrel  75 mg Oral Daily  . fenofibrate  160 mg Oral Daily  . potassium & sodium phosphates  1 packet Oral TID WC & HS  . sacubitril-valsartan  1 tablet Oral BID  . sodium chloride flush  3 mL Intravenous Q12H   Infusions:  . 0.9 % NaCl with KCl 20 mEq / L 40 mL/hr at 01/12/16 1429    Assessment: Pharmacy consulted to assist in managing electrolytes in this 65 y/o F with hypokalemia.   Plan:  Magnesium 4 g iv once. Patient on phos-nak tabs. Will give potassium chloride 20 meq po x 2 and f/u am labs.  5/6:  K=3.8 , Mag=2.6, Phos= 1.9.  Patient currently on NS w/ KCL 49meq/L at 40 ml/hr and Phos-NAK packet w/meals and bedtime. Will continue current therapy as patient had only received 2 doses of Phos-NAK so far. F/u w/ am labs.  5/7:  K=3.1, Mag=2.0, Phos=2.7. Patient currently on NS w/ KCL 59meq/L at 40 ml/hr and Phos-NAK packet w/meals and bedtime. Will give KCL 74meq po x 1 and recheck at 1600. Patient started on Entresto which can increase Potassium.  F/U with am labs.  5/7: K of 3.5 within normal limits this evening. No additional supplementation needed at this time. Will check electrolytes with AM labs tomorrow.   Lenis Noon, PharmD Clinical Pharmacist 01/14/2016,8:00 PM

## 2016-01-14 NOTE — Progress Notes (Signed)
Spoke with MD Lance Coon about low blood pressure, Chest Xray ordered. MD advised to cont to monitor. Latest BP 96/56. Spo2 98%. HR 71

## 2016-01-14 NOTE — Care Management Note (Signed)
Case Management Note  Patient Details  Name: Kayliah Tuffy MRN: [id number] Date of Birth: 1950/10/21  Subjective/Objective:     [facility]-SW following for SNF placement.                Action/Plan:   Expected Discharge Date:                  Expected Discharge Plan:     In-House Referral:     Discharge planning Services     Post Acute Care Choice:    Choice offered to:     DME Arranged:    DME Agency:     HH Arranged:    [place] Agency:     Status of Service:     Medicare Important Message Given:    Date Medicare IM Given:    Medicare IM give by:    Date Additional Medicare IM Given:    Additional Medicare Important Message give by:     If discussed at [place] of Stay Meetings, dates discussed:    Additional Comments:  Elaysia Devargas A, RN 01/14/2016, 11:15 AM

## 2016-01-14 NOTE — Progress Notes (Signed)
Critical lab received at 1125 by Kaylan. Troponin is 0.72. The patient's last troponin on 01/11/16 was 1.57. Dr. Margaretmary Eddy and Dr. Cherie Dark notified.

## 2016-01-14 NOTE — Progress Notes (Signed)
rn spoke with dr Margaretmary Eddy. Pt with endstage cardiomyopathy . Will probably need palliative  Care for comfort measures

## 2016-01-14 NOTE — Progress Notes (Addendum)
rn spoke with dr Margaretmary Eddy on rounds .pt very confused. Pt had large currant brown stool,no odor.md reports she will contact son matt re; CONDITION AND PROBABLE TRANSFER TO tertiary care  And plan of care. Pt is a full code.md orders gi panel,abgs, cdiff

## 2016-01-14 NOTE — Progress Notes (Signed)
Central telemetry notified RN patient had 10 beat run of SVT at 0820. Vitals: BP: 107/56, HR:67, Temp: 97.6, O2: 100%. MD wants a 12 lead EKG for the patients.  Deri Fuelling, RN

## 2016-01-14 NOTE — Progress Notes (Addendum)
rn spoke with pt son matt  [place] who is the POA. SON  agreed to transport to [facility] . Son can be reached at [phone] when pt arrives at new facility

## 2016-01-14 NOTE — Progress Notes (Signed)
Report given to Sarah RN at [facility]. Patient will be transferred to room 7805.  Deri Fuelling, RN 01/14/2016 5:47 PM

## 2016-01-14 NOTE — Progress Notes (Signed)
SUBJECTIVE: Patient is chronic confused and short of breath but no chest pain.   Filed Vitals:   01/14/16 0444 01/14/16 0737 01/14/16 0827 01/14/16 1124  BP: 96/56 110/74 107/56 90/50  Pulse: 71 66 67 72  Temp:  98 F (36.7 C) 97.6 F (36.4 C) 97.8 F (36.6 C)  TempSrc:  Oral Oral Oral  Resp:  18 16 16   Height:      Weight:      SpO2:  100% 100% 97%    Intake/Output Summary (Last 24 hours) at 01/14/16 1141 Last data filed at 01/14/16 0937  Gross per 24 hour  Intake   2152 ml  Output    300 ml  Net   1852 ml    LABS: Basic Metabolic Panel:  Recent Labs  01/13/16 0542 01/14/16 0343  NA 132* 135  K 3.8 3.1*  CL 102 108  CO2 19* 20*  GLUCOSE 155* 110*  BUN 14 14  CREATININE 0.94 1.14*  CALCIUM 7.8* 7.3*  MG 2.6* 2.0  PHOS 1.9* 2.7   Liver Function Tests:  Recent Labs  01/11/16 2023 01/12/16 2225  AST 261* 101*  ALT 213* 150*  ALKPHOS 149* 155*  BILITOT 1.3* 1.0  PROT 4.9* 5.0*  ALBUMIN 2.1* 2.1*   No results for input(s): LIPASE, AMYLASE in the last 72 hours. CBC:  Recent Labs  01/11/16 1608 01/12/16 0436 01/13/16 0542  WBC  --  16.1* 12.5*  NEUTROABS 17.7*  --   --   HGB  --  11.2* 13.1  HCT  --  35.2 41.7  MCV  --  86.8 86.8  PLT  --  88* 83*   Cardiac Enzymes:  Recent Labs  01/11/16 1608 01/12/16 0436 01/14/16 1043  CKTOTAL 1031* 881*  --   TROPONINI  --   --  0.72*   BNP: Invalid input(s): POCBNP D-Dimer: No results for input(s): DDIMER in the last 72 hours. Hemoglobin A1C: No results for input(s): HGBA1C in the last 72 hours. Fasting Lipid Panel: No results for input(s): CHOL, HDL, LDLCALC, TRIG, CHOLHDL, LDLDIRECT in the last 72 hours. Thyroid Function Tests: No results for input(s): TSH, T4TOTAL, T3FREE, THYROIDAB in the last 72 hours.  Invalid input(s): FREET3 Anemia Panel: No results for input(s): VITAMINB12, FOLATE, FERRITIN, TIBC, IRON, RETICCTPCT in the last 72 hours.   PHYSICAL EXAM General: Well developed,  well nourished, in no acute distress HEENT:  Normocephalic and atramatic Neck:  No JVD.  Lungs: Clear bilaterally to auscultation and percussion. Heart: HRRR . Normal S1 and S2 without gallops or murmurs.  Abdomen: Bowel sounds are positive, abdomen soft and non-tender  Msk:  Back normal, normal gait. Normal strength and tone for age. Extremities: No clubbing, cyanosis or edema.   Neuro: Alert and oriented X 3. Psych:  Good affect, responds appropriately  TELEMETRY:Sinus rhythm  ASSESSMENT AND PLAN: Non-STEMI with severe LV dysfunction with left ventricle ejection fraction 14%. Had a full discussion with the patient as well as the son along with Dr. Francis Dowse. Questionable cardiac catheterization was done patient wants to have it done but Is Reluctant to Have It Done. DC and Was Made to Hold off Doing Cardiac Catheterization. But Now Patient Is Stating That She Wants Everything Done and the Troponin Is 7.0. Since Patient Is DO NOT RESUSCITATE I Explained to the Patient That Chances of Complications Are Higher and Better Outcome Is Less Thus I Am Reluctant to Do Catheterization. However If They Change Their Mind and They Understand They  She Will Would Be High Risk Then Will Proceed with Cardiac Catheterization.  Active Problems:   Sepsis ([place])    Dionisio David, MD, [facility] 01/14/2016 11:41 AM     Mode of the top or to the top

## 2016-01-14 NOTE — Progress Notes (Signed)
Dr Margaretmary Eddy informed of abnormal ekg. md orders call cardiologist re; HYPOTENSION AND INABLITY TO ADMINISTER COREG AND HYDRALAZINE . RN SPOKE WITH DR Humphrey Rolls WHO WILL SEE PT TODAY

## 2016-01-14 NOTE — Consult Note (Signed)
[facility] Consultation      Name: Cheryl Hamilton MRN: [id number] DOB: 1951-06-05    ADMISSION DATE:  01/10/2016    CHIEF COMPLAINT:   Dizziness and SOB-resolving   HISTORY OF PRESENT ILLNESS  Patient alert and awake, follows commands, poor insight I have discussed her cardiac status and she has poor insight with her medical condition  L  Review of Systems  Constitutional: Negative for fever, chills, weight loss and malaise/fatigue.  HENT: Negative for congestion.   Respiratory: Negative for cough, hemoptysis, sputum production, shortness of breath and wheezing.   Cardiovascular: Negative for chest pain and palpitations.  Gastrointestinal: Negative for heartburn, nausea, vomiting and abdominal pain.  Skin: Negative for rash.  Neurological: Negative for dizziness and headaches.  Psychiatric/Behavioral: The patient is not nervous/anxious.   All other systems reviewed and are negative.     VITAL SIGNS    Temp:  [97.6 F (36.4 C)-98.7 F (37.1 C)] 97.8 F (36.6 C) (05/07 1124) Pulse Rate:  [64-95] 72 (05/07 1124) Resp:  [16-20] 16 (05/07 1124) BP: (76-112)/(36-75) 90/50 mmHg (05/07 1124) SpO2:  [90 %-100 %] 97 % (05/07 1124) HEMODYNAMICS:   VENTILATOR SETTINGS:   INTAKE / OUTPUT:  Intake/Output Summary (Last 24 hours) at 01/14/16 1251 Last data filed at 01/14/16 1221  Gross per 24 hour  Intake   2038 ml  Output      0 ml  Net   2038 ml       PHYSICAL EXAM   Physical Exam  Constitutional: She is oriented to person, place, and time. No distress.  HENT:  Head: Normocephalic and atraumatic.  Eyes: Pupils are equal, round, and reactive to light.  Neck: Neck supple.  Pulmonary/Chest: Effort normal. No respiratory distress. She has no wheezes. She has rales.  Abdominal: Soft.  Musculoskeletal: Normal range of motion. She exhibits no edema.  Neurological: She is alert and oriented to person, place, and time.  Skin: Skin is warm.  She is not diaphoretic.       LABS   LABS:  CBC  Recent Labs Lab 01/11/16 0340 01/12/16 0436 01/13/16 0542  WBC 20.0* 16.1* 12.5*  HGB 10.6* 11.2* 13.1  HCT 35.1 35.2 41.7  PLT 89* 88* 83*   Coag's  Recent Labs Lab 01/10/16 1541 01/11/16 1608 01/12/16 1956 01/13/16 1451  APTT 34  --  28 <24*  INR 2.08 2.00  --   --    BMET  Recent Labs Lab 01/12/16 2225 01/13/16 0542 01/14/16 0343  NA 131* 132* 135  K 3.3* 3.8 3.1*  CL 102 102 108  CO2 17* 19* 20*  BUN 16 14 14   CREATININE 1.09* 0.94 1.14*  GLUCOSE 131* 155* 110*   Electrolytes  Recent Labs Lab 01/12/16 0436  01/12/16 2225 01/13/16 0542 01/14/16 0343  CALCIUM  --   < > 7.4* 7.8* 7.3*  MG 1.5*  --   --  2.6* 2.0  PHOS 2.3*  --   --  1.9* 2.7  < > = values in this interval not displayed. Sepsis Markers  Recent Labs Lab 01/11/16 0340 01/12/16 1045 01/12/16 1437  LATICACIDVEN 9.0* 2.1* 4.0*   ABG  Recent Labs Lab 01/11/16 0358 01/11/16 1100 01/14/16 1100  PHART 7.19* 7.36 7.38  PCO2ART 16* 25* 33  PO2ART 122* 102 92   Liver Enzymes  Recent Labs Lab 01/11/16 0340 01/11/16 2023 01/12/16 2225  AST 425* 261* 101*  ALT 227* 213* 150*  ALKPHOS 189* 149* 155*  BILITOT 1.6* 1.3* 1.0  ALBUMIN 2.3* 2.1* 2.1*   Cardiac Enzymes  Recent Labs Lab 01/11/16 0029 01/11/16 0340 01/14/16 1043  TROPONINI 1.34* 1.57* 0.72*   Glucose  Recent Labs Lab 01/10/16 2004 01/10/16 2046 01/11/16 0030  GLUCAP <10* 109* 123*      MICRO DATA: MRSA PCR NEGATIVE  ANTIMICROBIALS: zosyn 5/3>>>5/5    ASSESSMENT/PLAN  65 yo white female with multiple medical issues with initial  acidosis from orthostatic hypotension with 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   Corrin Parker, M.D.  [facility]  Medical Director [place] Director [facility] Cardio-Pulmonary Department

## 2016-01-14 NOTE — Progress Notes (Signed)
Paged MD Lance Coon. Patient appears to be asleep but arousal with no signs of distress. BP 89/50 HR 65 Sp02 98% on 2L O2. 250 ml bolus normal saline ordered. Will cont to monitor

## 2016-01-15 LAB — LUPUS ANTICOAGULANT PANEL
DRVVT: 46.7 s (ref 0.0–47.0)
PTT LA: 31.6 s (ref 0.0–43.6)

## 2016-01-15 LAB — CULTURE, BLOOD (ROUTINE X 2)
CULTURE: NO GROWTH
Culture: NO GROWTH

## 2016-01-15 LAB — CARDIOLIPIN ANTIBODIES, IGG, IGM, IGA: Anticardiolipin IgG: <9 GPL U/mL (ref 0–14)

## 2016-01-15 LAB — LUPUS ANTICOAGULANT
DRVVT: 42.7 s (ref 0.0–47.0)
PTT Lupus Anticoagulant: 25.1 s (ref 0.0–43.6)
Thrombin Time: 13.3 s (ref 0.0–20.9)
dPT Confirm Ratio: 1.03 Ratio (ref 0.00–1.40)
dPT: 51.2 s (ref 0.0–55.0)

## 2016-01-17 ENCOUNTER — Inpatient Hospital Stay: Payer: Medicare Other

## 2016-01-18 LAB — IGG, IGA, IGM
IgA: 189 mg/dL (ref 87–352)
IgG (Immunoglobin G), Serum: 388 mg/dL — ABNORMAL LOW (ref 700–1600)
IgM, Serum: 81 mg/dL (ref 26–217)

## 2016-01-18 LAB — HEPATITIS C ANTIBODY: HCV Ab: <0.1 s/co ratio (ref 0.0–0.9)

## 2016-01-18 LAB — PT FACTOR INHIBITOR (MIXING STUDY)
1 HR INCUB PT 1:1NP: 11 s (ref 9.6–11.5)
PT 1:1NP: 10.7 s (ref 9.6–11.5)
PT: 11.6 s — ABNORMAL HIGH (ref 9.6–11.5)

## 2016-01-18 LAB — CRYOGLOBULIN

## 2016-02-08 DEATH — deceased

## 2017-05-21 IMAGING — MR MR LUMBAR SPINE W/O CM
5 series · 32 of 48 positions shown · non-contrast
Comparison: None.

CLINICAL DATA: Bilateral low back pain with sciatica.

EXAM:
MRI LUMBAR SPINE WITHOUT CONTRAST
TECHNIQUE: Multiplanar, multisequence MR imaging of the lumbar spine was
performed. No intravenous contrast was administered.

[Series 2: T2 · sagittal · 4.0mm · 0.81mm/px · 6 of 17 slices shown (1 of 2)]
[im 1/17]
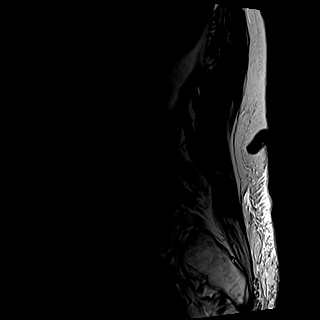
[im 4/17]
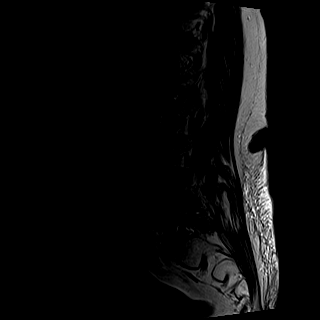
[im 7/17]
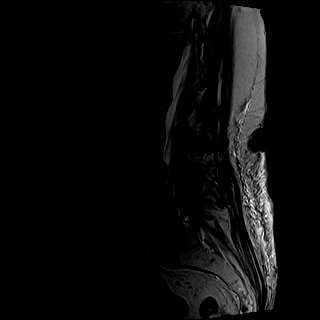
[im 10/17]
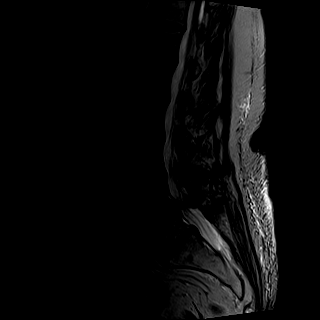
[im 13/17]
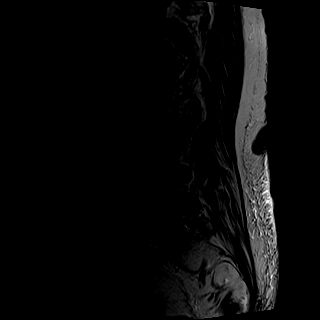
[im 17/17]
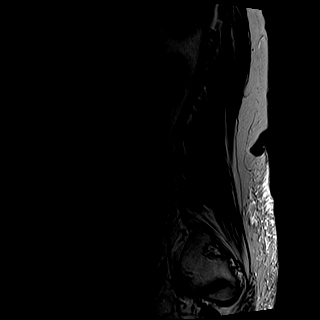

[Series 3: T1 · sagittal · 4.0mm · 0.41mm/px · 6 of 17 slices shown (1 of 2)]
[im 1/17]
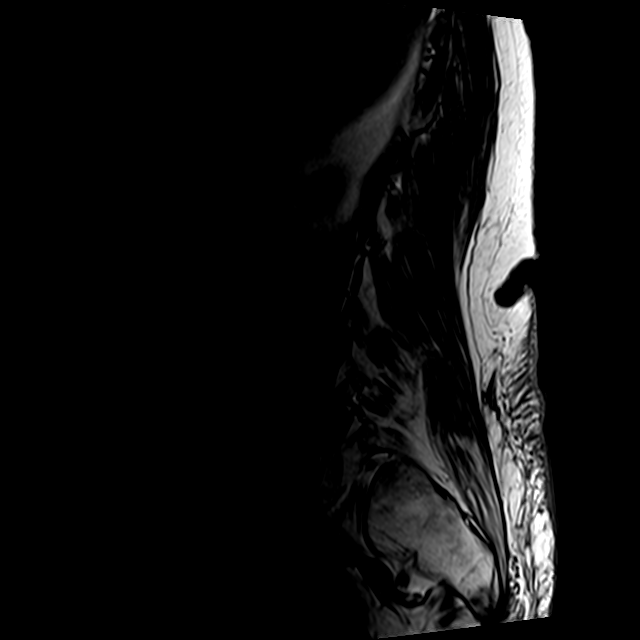
[im 4/17]
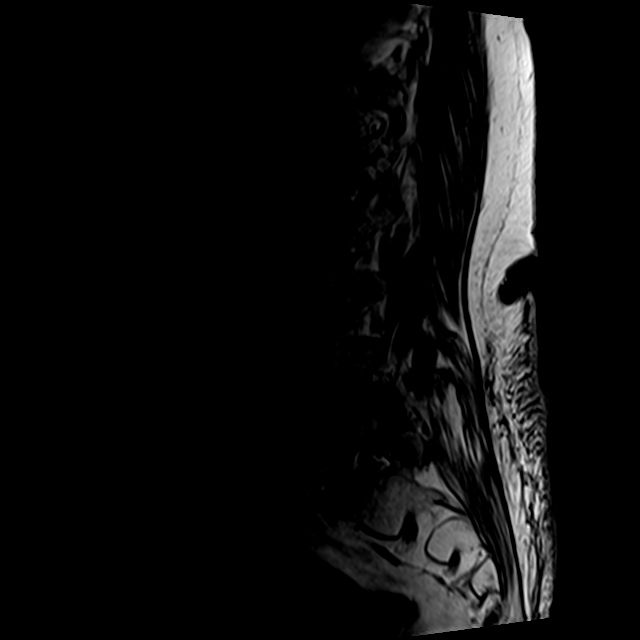
[im 7/17]
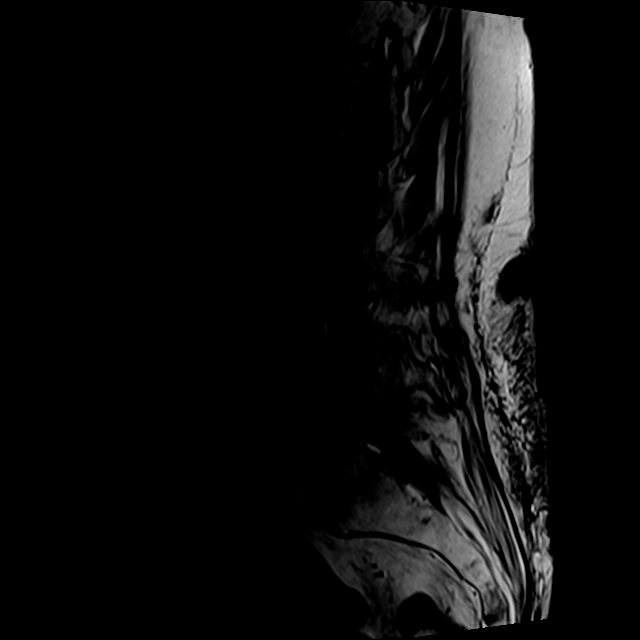
[im 10/17]
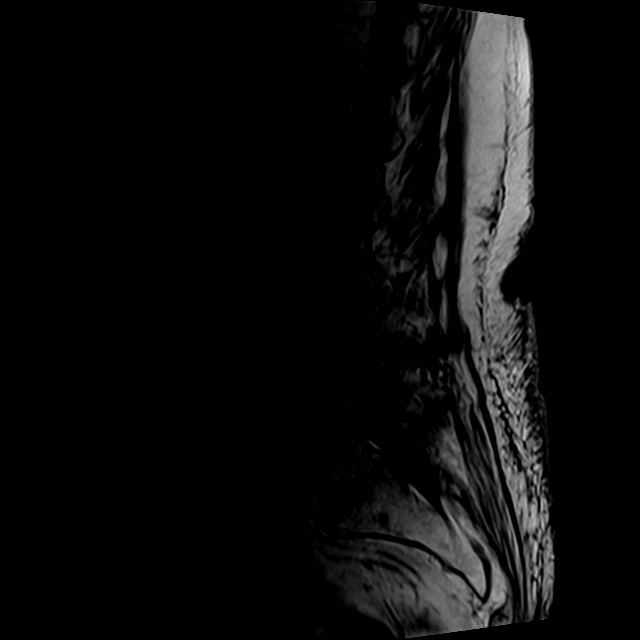
[im 13/17]
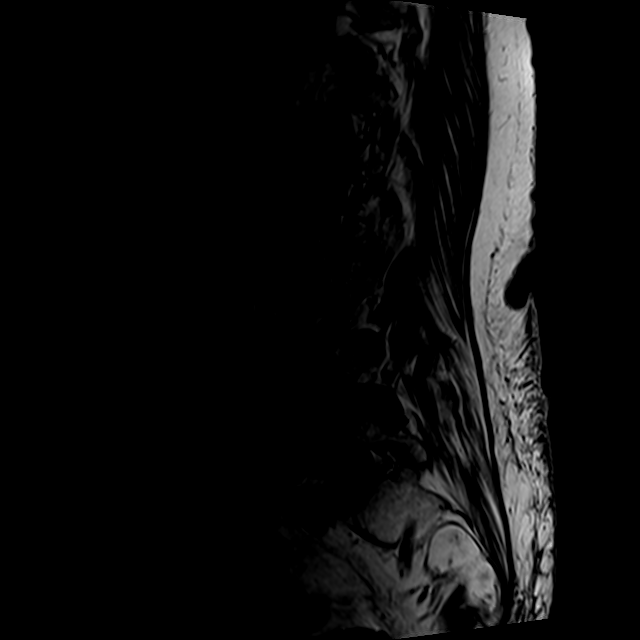
[im 17/17]
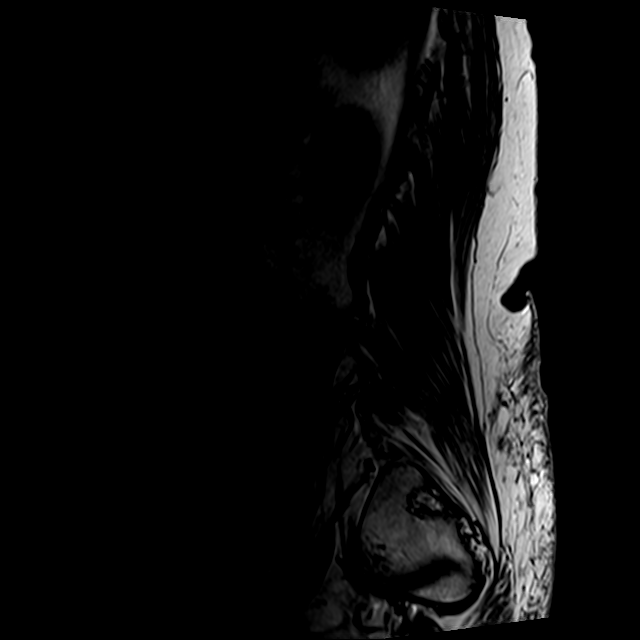

[Series 7: STIR · sagittal · 4.0mm · 0.68mm/px · 2 of 17 slices shown]
[im 1/17]
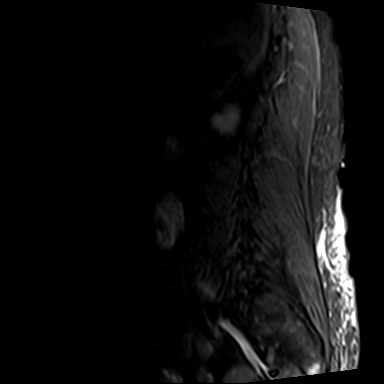
[im 4/17]
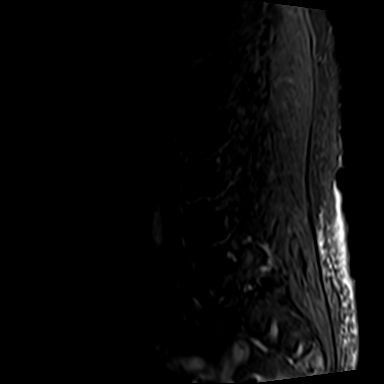

[Series 8: T2 · axial · 4.0mm · 1.04mm/px · z∈[-45,+149]mm · 9 of 39 slices shown (2 of 2)]
[im 1/39]
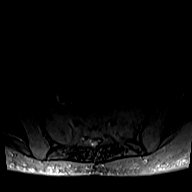
[im 6/39]
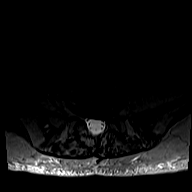
[im 11/39]
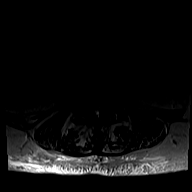
[im 17/39]
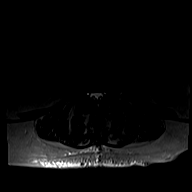
[im 20/39]
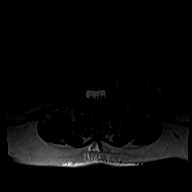
[im 22/39]
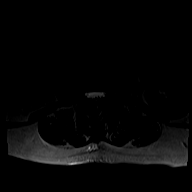
[im 28/39]
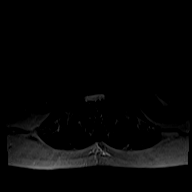
[im 33/39]
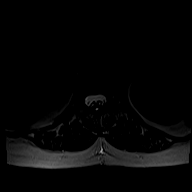
[im 39/39]
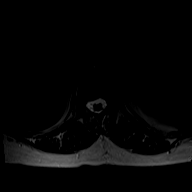

[Series 9: T1 · axial · 4.0mm · 0.52mm/px · z∈[-45,+149]mm · 9 of 39 slices shown (2 of 2)]
[im 1/39]
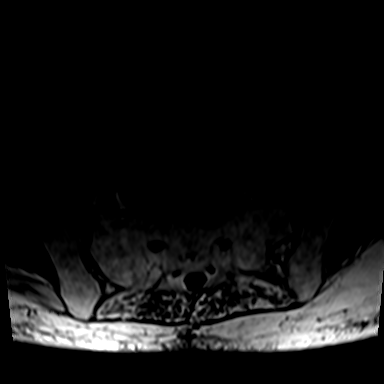
[im 6/39]
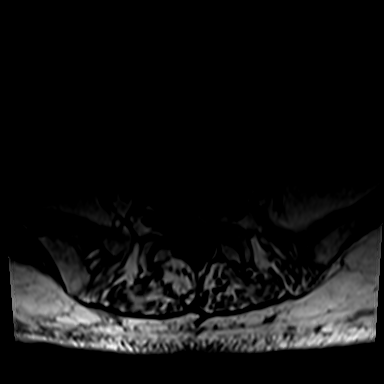
[im 11/39]
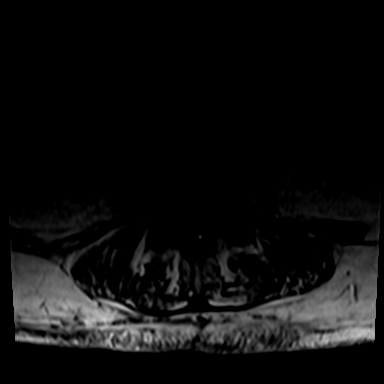
[im 17/39]
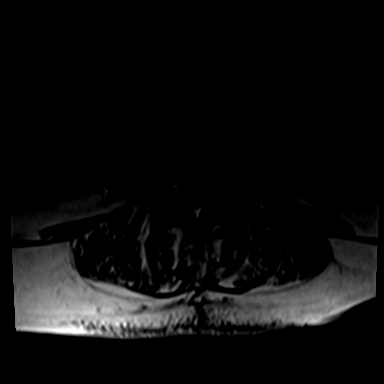
[im 20/39]
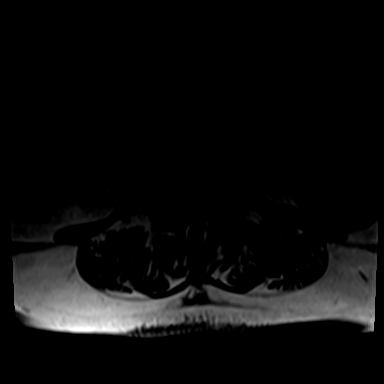
[im 22/39]
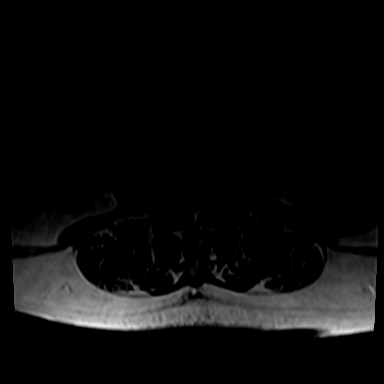
[im 28/39]
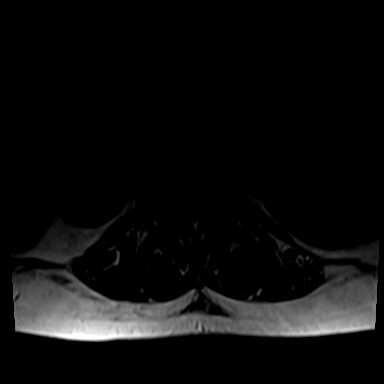
[im 33/39]
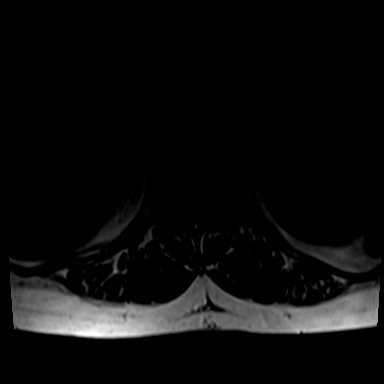
[im 39/39]
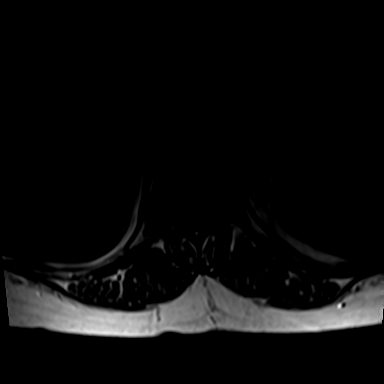

[32 of 48 positions shown; findings below may reference images not displayed]

FINDINGS: Chronic vertebral body height loss of the anterior L4 vertebral
body. The vertebral bodies of the lumbar spine are normal in
alignment. There is normal bone marrow signal demonstrated
throughout the vertebra. Degenerative disc disease with disc height
loss at L5-S1.

The spinal cord is normal in signal and contour. The cord terminates
normally at L1 . The nerve roots of the cauda equina and the filum
terminale are normal.

The visualized portions of the SI joints are unremarkable.

There is right renal atrophy. There are 2 T2 hyperintense left renal
mass is likely representing cysts.

T12-L1: Shallow right paracentral disc protrusion. No evidence of
neural foraminal stenosis. No central canal stenosis.

L1-L2: Mild broad-based disc bulge. No evidence of neural foraminal
stenosis. No central canal stenosis.

L2-L3: No significant disc bulge. No evidence of neural foraminal
stenosis. No central canal stenosis.

L3-L4: Mild broad-based disc bulge. No evidence of neural foraminal
stenosis. No central canal stenosis.

L4-L5: Moderate broad-based disc bulge. Moderate bilateral facet
arthropathy with ligamentum flavum infolding resulting in severe
spinal stenosis and severe bilateral foraminal stenosis.

L5-S1: Mild broad-based disc osteophyte complex. Mild bilateral
foraminal stenosis. No central canal stenosis.
IMPRESSION: 1. At L4-5 there is a moderate broad-based disc bulge. Moderate
bilateral facet arthropathy with ligamentum flavum infolding
resulting in severe spinal stenosis and severe bilateral foraminal
stenosis.
2. At T12-L1 there is a shallow right paracentral disc protrusion.
